# Patient Record
Sex: Female | Born: 1935 | Race: Black or African American | Hispanic: No | Smoking: Never smoker
Health system: Southern US, Community
[De-identification: ages and names within clinical notes are randomized; demographics above are authoritative.]

## PROBLEM LIST (undated history)

## (undated) DIAGNOSIS — I509 Heart failure, unspecified: Secondary | ICD-10-CM

## (undated) DIAGNOSIS — S42409A Unspecified fracture of lower end of unspecified humerus, initial encounter for closed fracture: Secondary | ICD-10-CM

## (undated) DIAGNOSIS — I2510 Atherosclerotic heart disease of native coronary artery without angina pectoris: Secondary | ICD-10-CM

## (undated) DIAGNOSIS — I4891 Unspecified atrial fibrillation: Secondary | ICD-10-CM

## (undated) DIAGNOSIS — D649 Anemia, unspecified: Secondary | ICD-10-CM

## (undated) DIAGNOSIS — S82209A Unspecified fracture of shaft of unspecified tibia, initial encounter for closed fracture: Secondary | ICD-10-CM

## (undated) DIAGNOSIS — I10 Essential (primary) hypertension: Secondary | ICD-10-CM

## (undated) HISTORY — DX: Unspecified fracture of lower end of unspecified humerus, initial encounter for closed fracture: S42.409A

## (undated) HISTORY — DX: Anemia, unspecified: D64.9

## (undated) HISTORY — DX: Unspecified fracture of shaft of unspecified tibia, initial encounter for closed fracture: S82.209A

---

## 1999-02-12 ENCOUNTER — Encounter: Payer: Self-pay | Admitting: Cardiology

## 1999-02-12 ENCOUNTER — Ambulatory Visit (HOSPITAL_COMMUNITY): Admission: RE | Admit: 1999-02-12 | Discharge: 1999-02-12 | Payer: Self-pay | Admitting: Cardiology

## 2000-11-10 ENCOUNTER — Encounter: Payer: Self-pay | Admitting: Cardiology

## 2000-11-10 ENCOUNTER — Ambulatory Visit (HOSPITAL_COMMUNITY): Admission: RE | Admit: 2000-11-10 | Discharge: 2000-11-10 | Payer: Self-pay | Admitting: Cardiology

## 2001-07-06 ENCOUNTER — Emergency Department (HOSPITAL_COMMUNITY): Admission: EM | Admit: 2001-07-06 | Discharge: 2001-07-06 | Payer: Self-pay | Admitting: Unknown Physician Specialty

## 2003-07-23 ENCOUNTER — Encounter: Admission: RE | Admit: 2003-07-23 | Discharge: 2003-07-23 | Payer: Self-pay | Admitting: Cardiology

## 2003-10-24 ENCOUNTER — Ambulatory Visit (HOSPITAL_COMMUNITY): Admission: RE | Admit: 2003-10-24 | Discharge: 2003-10-24 | Payer: Self-pay | Admitting: Cardiology

## 2004-03-07 ENCOUNTER — Ambulatory Visit: Payer: Self-pay | Admitting: Physical Medicine & Rehabilitation

## 2004-03-07 ENCOUNTER — Inpatient Hospital Stay (HOSPITAL_COMMUNITY): Admission: RE | Admit: 2004-03-07 | Discharge: 2004-03-12 | Payer: Self-pay | Admitting: Neurosurgery

## 2004-07-30 ENCOUNTER — Encounter: Admission: RE | Admit: 2004-07-30 | Discharge: 2004-10-28 | Payer: Self-pay | Admitting: Neurosurgery

## 2005-05-20 ENCOUNTER — Encounter: Admission: RE | Admit: 2005-05-20 | Discharge: 2005-05-20 | Payer: Self-pay | Admitting: Cardiology

## 2006-08-07 ENCOUNTER — Ambulatory Visit (HOSPITAL_COMMUNITY): Admission: RE | Admit: 2006-08-07 | Discharge: 2006-08-07 | Payer: Self-pay | Admitting: Ophthalmology

## 2006-09-29 ENCOUNTER — Emergency Department (HOSPITAL_COMMUNITY): Admission: EM | Admit: 2006-09-29 | Discharge: 2006-09-29 | Payer: Self-pay | Admitting: Emergency Medicine

## 2008-10-20 ENCOUNTER — Encounter (INDEPENDENT_AMBULATORY_CARE_PROVIDER_SITE_OTHER): Payer: Self-pay | Admitting: Cardiology

## 2008-10-20 ENCOUNTER — Ambulatory Visit: Payer: Self-pay | Admitting: Vascular Surgery

## 2008-10-20 ENCOUNTER — Ambulatory Visit (HOSPITAL_COMMUNITY): Admission: RE | Admit: 2008-10-20 | Discharge: 2008-10-20 | Payer: Self-pay | Admitting: Cardiology

## 2008-11-09 ENCOUNTER — Encounter (HOSPITAL_COMMUNITY): Admission: RE | Admit: 2008-11-09 | Discharge: 2009-01-10 | Payer: Self-pay | Admitting: Cardiology

## 2008-11-11 ENCOUNTER — Emergency Department (HOSPITAL_COMMUNITY): Admission: EM | Admit: 2008-11-11 | Discharge: 2008-11-11 | Payer: Self-pay | Admitting: Emergency Medicine

## 2008-11-18 ENCOUNTER — Emergency Department (HOSPITAL_COMMUNITY): Admission: EM | Admit: 2008-11-18 | Discharge: 2008-11-18 | Payer: Self-pay | Admitting: Family Medicine

## 2010-01-10 ENCOUNTER — Ambulatory Visit (HOSPITAL_COMMUNITY): Admission: RE | Admit: 2010-01-10 | Discharge: 2010-01-10 | Payer: Self-pay | Admitting: Internal Medicine

## 2010-07-11 ENCOUNTER — Ambulatory Visit
Admission: RE | Admit: 2010-07-11 | Discharge: 2010-07-11 | Disposition: A | Discharge status: Home / Self Care | Payer: Medicare Other | Source: Ambulatory Visit | Attending: Cardiology | Admitting: Cardiology

## 2010-07-11 ENCOUNTER — Other Ambulatory Visit: Payer: Self-pay | Admitting: Cardiology

## 2010-07-11 DIAGNOSIS — R109 Unspecified abdominal pain: Secondary | ICD-10-CM

## 2010-07-14 LAB — BASIC METABOLIC PANEL
CO2: 26 mEq/L (ref 19–32)
Calcium: 9.4 mg/dL (ref 8.4–10.5)
GFR calc non Af Amer: 51 mL/min — ABNORMAL LOW (ref >60)
Glucose, Bld: 90 mg/dL (ref 70–99)
Potassium: 3.8 mEq/L (ref 3.5–5.1)
Sodium: 140 mEq/L (ref 135–145)

## 2010-07-14 LAB — CBC
Platelets: 199 10*3/uL (ref 150–400)
RBC: 3.66 MIL/uL — ABNORMAL LOW (ref 3.87–5.11)
WBC: 5.8 10*3/uL (ref 4.0–10.5)

## 2010-07-14 LAB — POCT CARDIAC MARKERS: Troponin i, poc: <0.05 ng/mL (ref 0.00–0.09)

## 2010-07-14 LAB — DIFFERENTIAL
Basophils Relative: 0 % (ref 0–1)
Lymphs Abs: 2 10*3/uL (ref 0.7–4.0)
Monocytes Absolute: 0.6 10*3/uL (ref 0.1–1.0)
Monocytes Relative: 10 % (ref 3–12)
Neutro Abs: 3 10*3/uL (ref 1.7–7.7)
Neutrophils Relative %: 52 % (ref 43–77)

## 2010-08-21 ENCOUNTER — Ambulatory Visit (HOSPITAL_COMMUNITY)
Admission: RE | Admit: 2010-08-21 | Discharge: 2010-08-21 | Disposition: A | Discharge status: Home / Self Care | Payer: Medicare Other | Source: Ambulatory Visit | Attending: Cardiology | Admitting: Cardiology

## 2010-08-21 DIAGNOSIS — I2510 Atherosclerotic heart disease of native coronary artery without angina pectoris: Secondary | ICD-10-CM | POA: Insufficient documentation

## 2010-08-21 DIAGNOSIS — I509 Heart failure, unspecified: Secondary | ICD-10-CM | POA: Insufficient documentation

## 2010-08-21 DIAGNOSIS — I10 Essential (primary) hypertension: Secondary | ICD-10-CM | POA: Insufficient documentation

## 2010-08-21 DIAGNOSIS — I209 Angina pectoris, unspecified: Secondary | ICD-10-CM | POA: Insufficient documentation

## 2010-08-21 DIAGNOSIS — E039 Hypothyroidism, unspecified: Secondary | ICD-10-CM | POA: Insufficient documentation

## 2010-08-24 NOTE — Discharge Summary (Signed)
NAMEKATEENA, Danielle [name]                ACCOUNT NO.:  [id number]   MEDICAL RECORD NO.:  [id number]          PATIENT TYPE:  INP   LOCATION:  3014                         FACILITY:  [facility]   PHYSICIAN:  Cristi Loron, M.D.DATE OF BIRTH:  [birth year]   DATE OF ADMISSION:  03/07/2004  DATE OF DISCHARGE:  03/12/2004                                 DISCHARGE SUMMARY   BRIEF HISTORY:  Patient is an 77 year old black female who suffers with  symptoms consistent with neurogenic claudication.  She has failed medical  management and was worked up with a lumbar MRI, which demonstrates spinal  stenosis at L2-3, 3-4, 4-5, and 5-1.  I discussed the various treatment  options with the patient, including surgery.  The patient has weighed the  risks, benefits and alternatives of surgery and decided to proceed with a  decompressive laminectomy.   For further details of this admission, please refer to the admission history  and physical.   HOSPITAL COURSE:  I admitted the patient to [facility] on March 07, 2004.  On the day of admission, I performed an L2-S1 laminectomy to  treat her spinal stenosis.  For full details of the operation, please refer  to typed operative note.   The patient's postoperative course was remarkable only as follows:  She was  noted to be anemic with a hemoglobin of 7.6.  She started off with a low  hemoglobin, and she was therefore transfused two units of packed cells.  We  had PT, OT, and rehab team see the patient.  They did not think that she  needed inpatient rehab.  By March 12, 2004, the patient was afebrile.  Vital signs were stable.  She was eating well, ambulating well.  Her wound  was healing well without signs of infection.  She was requesting discharge  to home.  At that time, I should mention her post-transfusion hemoglobin had  increased to 10.1.  Patient was discharged to home on March 12, 2004.  Arrangements were made for home health   care/PT/OT.   DISCHARGE INSTRUCTIONS:  Patient was instructed to resume her outpatient  medical regimen and to follow up with me in four weeks.  She was given  written discharge instructions.   DISCHARGE PRESCRIPTIONS:  1.  Percocet 5 #100 1-2 p.o. q.4h. p.r.n. pain.  2.  Valium 2 mg #50 1 p.o. q.8h. p.r.n. muscle spasms.   FINAL DIAGNOSES:  L2-3, L3-4, L4-5, L5-S1 spinal stenosis, degenerative disk  disease, lumbago, lumbar radiculopathy.   PROCEDURE PERFORMED:  Bilateral L2 laminotomies, complete laminectomy at L3-  5 with a partial S1 laminectomy using microdissection.    JDJ/MEDQ  D:  04/19/2004  T:  04/19/2004  Job:  [id number]

## 2010-08-24 NOTE — Op Note (Signed)
Danielle [name], Danielle [name]                ACCOUNT NO.:  [id number]   MEDICAL RECORD NO.:  [id number]          PATIENT TYPE:  INP   LOCATION:  3014                         FACILITY:  [facility]   PHYSICIAN:  Cristi Loron, M.D.DATE OF BIRTH:  06-Aug-1916   DATE OF PROCEDURE:  03/07/2004  DATE OF DISCHARGE:                                 OPERATIVE REPORT   BRIEF HISTORY:  The patient is an 77 year old black female who suffered from  symptoms consistent with neurogenic claudication.  She failed medical  management and was worked up with a lumbar MRI which demonstrated spinal  stenosis at L2-3, L3-4, L4-5, and L5-S1.  I discussed the various treatment  options with the patient, including surgery.  The patient weighed the risks,  benefits, and alternatives to surgery, so I proceeded with a decompressive  laminectomy.   PREOPERATIVE DIAGNOSIS:  L2-3, L3-4, L4-5, L5-S1 spinal stenosis,  degenerative disc disease, lumbago, lumbar radiculopathy.   POSTOPERATIVE DIAGNOSIS:  L2-3, L3-4, L4-5, L5-S1 spinal stenosis,  degenerative disc disease, lumbago, lumbar radiculopathy.   PROCEDURE:  Bilateral L2 laminotomies; laminectomy L3, L4, and L5, with a  partial S1 laminectomy using microdissection.   SURGEON:  Cristi Loron, M.D.   ASSISTANT:  Hewitt Shorts, M.D.   ANESTHESIA:  General endotracheal.   ESTIMATED BLOOD LOSS:  400 cc.   SPECIMENS:  None.   DRAINS:  None.   COMPLICATIONS:  None.   DESCRIPTION OF PROCEDURE:  The patient was brought to the operating room by  the anesthesia team.  General endotracheal anesthesia was induced.  The  patient was turned to the prone position on the Wilson frame.  Her  lumbosacral region was then prepared with Betadine scrub and Betadine  solution.  Sterile drapes were then applied.  I then injected the _____ with  Marcaine with epinephrine solution and used a scalpel to make a linear  midline incision over the spinous processes from  approximately L2 down to  the upper sacrum.  I used electrocautery to provide a subperiosteal  dissection, exposing the spinous processes and the lamina of L1 down to the  upper sacrum.  I then obtained an intraoperative radiograph.  I inserted a  Hubbard's retractor for exposure.  I began by incising the L2-3, L3-4, L4-5,  and L5-S1 paraspinous ligament with a scalpel, and then used the Leksell  rongeur to remove the L3, L4, and L5 spinous process and the caudal aspect  of the L2 spinous process and the upper aspect of the S1 spinous process.  I  then used the high-speed drill to perform L5, L4, L3, and L2 laminotomies.  I completed the laminotomy at L4, L4, and L3 with the Kerrison punch and  widened the laminotomy at L2 with the Kerrison punch.  I removed the  ligamentum flavum at L2-3, L3-4, L4-5, and L5-S1.  I did create a small  durotomy down in the midline dorsal dura at S1.  I then did more of an S1  laminectomy with the Kerrison punch, exposing more the dura, and then I  closed the durotomy with a running  6-0 Prolene suture.  We got a good  closure, and we had anesthesia Valsalva the patient.  There was no spinal  fluid leakage.  We then completed the decompression.  We performed  foraminotomies about the bilateral L3, L4, L5, and nerve roots.  We then  inspected about L2-L3, L3-4, L4-5, and L5-S1 intervertebral disk.  Note that  there was some bulging but no significant herniations.  At this point, we  were satisfied with our decompression of the thecal and the bilateral L3,  L4, L5, and S1 nerve roots.  We achieved hemostasis using bipolar  electrocautery.  We then laid some Tisseal over the durotomy repair, and  then removed the Hubbard's retractor and reapproximated the patient's  thoracolumbar fascia with interrupted #1 Vicryl suture, the subcutaneous  tissue with interrupted 2-0 Vicryl suture, and the skin with Steri-Strips  and Benzoin.  The wound was then coated with  bacitracin ointment and a  sterile dressing applied.  The drapes were removed, and the patient was  subsequently returned to the supine position, where she was extubated by the  anesthesia team and transported to the postanesthesia care unit in stable  condition.  All sponge, needle, and instrument counts were correct at the  end of this case.   ADDENDUM:  I should mention that we used microdissection to complete the  durotomy repair as well as to complete the foraminotomies about the  bilateral L3,4, 5, and S1 nerve roots.  We did this under magnification and  illumination of the microscope.       JDJ/MEDQ  D:  03/07/2004  T:  03/07/2004  Job:  [id number]

## 2010-08-24 NOTE — Op Note (Signed)
NAMEELENY, Danielle [name]                ACCOUNT NO.:  [id number]   MEDICAL RECORD NO.:  [id number]          PATIENT TYPE:  AMB   LOCATION:  SDS                          FACILITY:  [facility]   PHYSICIAN:  Salley Scarlet., M.D.DATE OF BIRTH:  Dec 23, 1916   DATE OF PROCEDURE:  08/09/2006  DATE OF DISCHARGE:  08/07/2006                               OPERATIVE REPORT   PREOPERATIVE DIAGNOSIS:  Immature cataract, right eye.   POSTOPERATIVE DIAGNOSIS:  Immature cataract, right eye.   OPERATION:  [facility] phacoemulsification cataract, right eye, with  intraocular lens implantation.   ANESTHESIA:  General.   JUSTIFICATION FOR PROCEDURE:  This is an 77 year old lady who presented  with complaints of blurring of vision with difficulty seeing to get  around.  She was evaluated and found to have a visual acuity best  corrected to 20/200 on the right and 20/100 on the left.  There were  bilateral 3+ nuclear sclerotic cataracts slightly worse on the right  than the left.  Cataract extraction with intraocular lens implantation  was recommended.  She is admitted at this time for this purpose.   PROCEDURE:  The the patient was prepped and draped in the usual manner.  During the time that she was being prepped, she developed a severe cramp  in her right thigh and began to thrash about.  She had been extremely  talkative and difficult to control in this preoperative period.  Decision was, therefore, made to intubate her and do her under general  anesthesia.  Therefore, the case of was continued under general  anesthesia.  She was then reprepped and draped.  The lid speculum was  inserted under the upper lower lid of the right eye and a 4-0 silk  traction suture was passed through the belly of the superior rectus  muscle for traction.  A fornix-based conjunctival flap was turned and  hemostasis achieved using cautery.  An incision made in the sclera at  the limbus.  This incision was dissected down to  clear cornea using  crescent blade.  A sideport incision made at the 1:30 o'clock position.  Ocucoat was injected into the eye through the sideport incision.  The  anterior chamber was entered through the corneoscleral tunnel incision  at the 11:30 o'clock position.  The KPE handpiece was passed into the  eye and the nucleus was begun to be emulsified.  During this procedure,  it was noted that the anterior chamber deepened and vitreous presented  itself in the wound.  The nucleus began to drop back into the vitreous  cavity.  The procedure was terminated at this point and the  corneoscleral wound was extended first toward the right and then toward  the left  using corneal scissors, placing an 8-0 Vicryl suture across  each outlet incision made respectively toward the left and toward the  right.  The nucleus was then able to be retrieved and then manually  extracted from the eye.  The two previously placed sutures were closed  and a third one was placed at the 12 o'clock position.  The vitrector  was passed into the eye.  And an  anterior vitrectomy was done.  After  making sure that the wound had been toileted adequately, Ocucoat was  injected into the eye and the pupils constricted using Miochol.  An  anterior chamber lens was seated across the iris into the chamber angle  and rotated in the horizontal position.  Corneoscleral wound were closed  by using a combination of interrupted sutures of 8-0 Vicryl and 10-0  nylon.  After it was ascertained that the wound was airtight and  watertight, the conjunctiva was closed using thermal cautery.  Celestone, 1 mL, 0.5 mL of gentamicin were injected subconjunctivally.  Maxitrol ophthalmic ointment and atropine drops were applied along with  a patch and Fox shield.  The patient tolerated the procedure well and  was discharged to the post anesthesia  recovery room in satisfactory condition.  She is instructed to rest  today, to take Tylenol every  4 hours as needed for pain and to see me in  the office tomorrow for further evaluation   DISCHARGE DIAGNOSES:  Immature cataract, right eye.      Salley Scarlet., M.D.  Electronically Signed     TB/MEDQ  D:  08/09/2006  T:  08/09/2006  Job:  [id number]

## 2010-08-26 NOTE — Cardiovascular Report (Signed)
Danielle [name], Danielle [name]                ACCOUNT NO.:  [id number]  MEDICAL RECORD NO.:  [id number]           PATIENT TYPE:  O  LOCATION:  [facility]                         FACILITY:  [facility]  PHYSICIAN:  Kendrix Orman N. Sharyn Lull, M.D. DATE OF BIRTH:  Mar 04, 1917  DATE OF PROCEDURE:  08/21/2010 DATE OF DISCHARGE:                           CARDIAC CATHETERIZATION   PROCEDURE:  Left cardiac cath with selective left and right coronary angiography, LV graphy via right groin using Judkins technique.  INDICATIONS FOR PROCEDURE:  Danielle [name] is a 77 year old black female with past medical history significant for hypertension, history of congestive heart failure, history of hypothyroidism, degenerative joint disease, history of angina pectoris, complains of retrosternal chest pain, described as tired feeling in the chest, radiating to the neck and arms.  Denies any nausea, vomiting, diaphoresis.  Denies palpitation, lightheadedness, or syncope.  The patient denies PND, orthopnea, or leg swelling.  EKG done in the past showed normal sinus rhythm with left bundle-branch block pattern.  Also, the patient complains of shortness of breath with minimal exertion.  Denies chest pain at present.  Denies any invasive workup in the past.  Denies cough, fever, chills.  Denies relation of chest pain to food, breathing, or movement.  Due to typical anginal chest pain, multiple risk factors discussed with the patient regarding left cath, possible PTCA stenting, its risks and benefits i.e. death, stroke, need for emergency CABG, local vascular complications etc., and consented for PCI.  PROCEDURE:  After obtaining informed consent, the patient was brought to the cath lab and was placed on fluoroscopy table.  The right groin was prepped and draped in usual fashion.  Xylocaine 1% was used for local anesthesia in the right groin.  With the help of thin-wall needle, 5- French arterial sheath was placed.  The sheath was aspirated  and flushed.  Next, 5-French left Judkins catheter was advanced over the wire under fluoroscopic guidance up to the ascending aorta.  Wire was pulled out, the catheter was aspirated and connected to the manifold. Catheter was further advanced and engaged into left coronary ostium. Multiple views of the left system were taken.  Next, the catheter was disengaged and was pulled out over the wire and was replaced with 5- [place] right Judkins catheter, which was advanced over the wire under fluoroscopic guidance up to the ascending aorta.  Wire was pulled out, the catheter was aspirated and connected to the manifold.  Catheter was further advanced and engaged into the right coronary ostium.  Multiple views of the right system were taken.  Next, the catheter was disengaged and was pulled out over the wire and was replaced with 5-French pigtail catheter which was advanced over the wire under fluoroscopic guidance up to the ascending aorta.  Wire was pulled out, the catheter was aspirated and connected to the manifold.  Catheter was further advanced across aortic valve into the LV.  LV pressures were recorded.  Next, LV graphy was done in 30-degree RAO position.  Post angiographic pressures were recorded from LV and then pullback pressures were recorded from the aorta.  There was no gradient across the aortic  valve.  Next, pigtail catheter was pulled out over the wire.  Sheaths were aspirated and flushed.  FINDINGS:  LV showed good LV systolic function.  LVH with EF of 55-60%. Left main has 20-25% ostial stenosis.  LAD has 10-15% proximal and mid stenosis.  Diagonal I and III were very small, which has mild disease. Left circumflex has 20-30% proximal stenosis.  OM1 is large which is patent.  OM-2 is small which is patent.  RCA has 30-40% proximal stenosis.  PDA and PLV branches were patent.  The patient tolerated the procedure well.  There were no complications.  The patient was transferred to  recovery room in stable condition.     Danielle [name]. Sharyn Lull, M.D.     MNH/MEDQ  D:  08/21/2010  T:  08/21/2010  Job:  [id number]  Electronically Signed by Rinaldo Cloud M.D. on 08/26/2010 08:57:00 PM

## 2010-09-24 ENCOUNTER — Institutional Professional Consult (permissible substitution): Payer: Medicare Other | Admitting: Internal Medicine

## 2010-09-25 ENCOUNTER — Encounter: Payer: Self-pay | Admitting: Internal Medicine

## 2011-01-23 LAB — COMPREHENSIVE METABOLIC PANEL
ALT: 10
AST: 21
Albumin: 3.9
Alkaline Phosphatase: 116
Chloride: 101
Potassium: 3.9
Sodium: 138
Total Bilirubin: 0.9

## 2011-01-23 LAB — CBC
HCT: 38.5
Platelets: 263
RDW: 13.5
WBC: 7.3

## 2011-01-23 LAB — URINE MICROSCOPIC-ADD ON

## 2011-01-23 LAB — DIFFERENTIAL
Basophils Relative: 0
Eosinophils Absolute: 0
Monocytes Relative: 9
Neutrophils Relative %: 63

## 2011-01-23 LAB — URINE CULTURE: Colony Count: NO GROWTH

## 2011-01-23 LAB — URINALYSIS, ROUTINE W REFLEX MICROSCOPIC
Glucose, UA: NEGATIVE
Ketones, ur: 15 — AB
Nitrite: NEGATIVE
pH: 5.5

## 2011-05-15 ENCOUNTER — Telehealth: Payer: Self-pay | Admitting: *Deleted

## 2011-05-15 NOTE — Telephone Encounter (Signed)
Spoke with Daughter.... Lisa's, the granddaughter, phone is not working. Scheduled appointment for tomorrow per Dr Johney Frame. Told her if she couldn't come please call us ASAP so we can reschedule her, but she does need to be seen ASAP. We rcvd call from Dr Shana Chute, he wanted pt seen.

## 2011-05-16 ENCOUNTER — Ambulatory Visit: Payer: Medicare Other | Admitting: Internal Medicine

## 2011-09-17 ENCOUNTER — Encounter (HOSPITAL_COMMUNITY): Payer: Self-pay | Admitting: *Deleted

## 2011-09-17 ENCOUNTER — Emergency Department (HOSPITAL_COMMUNITY)
Admission: EM | Admit: 2011-09-17 | Discharge: 2011-09-18 | Disposition: A | Discharge status: Home / Self Care | Payer: Medicare Other | Attending: Emergency Medicine | Admitting: Emergency Medicine

## 2011-09-17 DIAGNOSIS — R609 Edema, unspecified: Secondary | ICD-10-CM | POA: Insufficient documentation

## 2011-09-17 DIAGNOSIS — Z79899 Other long term (current) drug therapy: Secondary | ICD-10-CM | POA: Insufficient documentation

## 2011-09-17 DIAGNOSIS — R600 Localized edema: Secondary | ICD-10-CM

## 2011-09-17 DIAGNOSIS — I2510 Atherosclerotic heart disease of native coronary artery without angina pectoris: Secondary | ICD-10-CM | POA: Insufficient documentation

## 2011-09-17 DIAGNOSIS — R0602 Shortness of breath: Secondary | ICD-10-CM | POA: Insufficient documentation

## 2011-09-17 DIAGNOSIS — I509 Heart failure, unspecified: Secondary | ICD-10-CM | POA: Insufficient documentation

## 2011-09-17 DIAGNOSIS — I10 Essential (primary) hypertension: Secondary | ICD-10-CM | POA: Insufficient documentation

## 2011-09-17 DIAGNOSIS — I499 Cardiac arrhythmia, unspecified: Secondary | ICD-10-CM | POA: Insufficient documentation

## 2011-09-17 HISTORY — DX: Atherosclerotic heart disease of native coronary artery without angina pectoris: I25.10

## 2011-09-17 HISTORY — DX: Essential (primary) hypertension: I10

## 2011-09-17 HISTORY — DX: Heart failure, unspecified: I50.9

## 2011-09-17 NOTE — ED Notes (Signed)
Both feet ankles and lower legs swollen for 3-4 days.  Also having difficulty urinating

## 2011-09-18 ENCOUNTER — Emergency Department (HOSPITAL_COMMUNITY): Payer: Medicare Other

## 2011-09-18 LAB — COMPREHENSIVE METABOLIC PANEL
ALT: 6 U/L (ref 0–35)
Alkaline Phosphatase: 65 U/L (ref 39–117)
BUN: 15 mg/dL (ref 6–23)
CO2: 28 mEq/L (ref 19–32)
GFR calc Af Amer: 55 mL/min — ABNORMAL LOW (ref >90)
GFR calc non Af Amer: 47 mL/min — ABNORMAL LOW (ref >90)
Glucose, Bld: 90 mg/dL (ref 70–99)
Potassium: 3.6 mEq/L (ref 3.5–5.1)
Sodium: 141 mEq/L (ref 135–145)
Total Protein: 7 g/dL (ref 6.0–8.3)

## 2011-09-18 LAB — URINALYSIS, ROUTINE W REFLEX MICROSCOPIC
Bilirubin Urine: NEGATIVE
Glucose, UA: NEGATIVE mg/dL
Hgb urine dipstick: NEGATIVE
Ketones, ur: NEGATIVE mg/dL
Protein, ur: NEGATIVE mg/dL
Urobilinogen, UA: 0.2 mg/dL (ref 0.0–1.0)

## 2011-09-18 LAB — TROPONIN I: Troponin I: <0.3 ng/mL (ref <0.30)

## 2011-09-18 LAB — CBC
Hemoglobin: 10.3 g/dL — ABNORMAL LOW (ref 12.0–15.0)
MCH: 30.9 pg (ref 26.0–34.0)
MCV: 91.9 fL (ref 78.0–100.0)
Platelets: 195 10*3/uL (ref 150–400)
RBC: 3.33 MIL/uL — ABNORMAL LOW (ref 3.87–5.11)
WBC: 3.9 10*3/uL — ABNORMAL LOW (ref 4.0–10.5)

## 2011-09-18 LAB — DIFFERENTIAL
Eosinophils Absolute: 0.2 10*3/uL (ref 0.0–0.7)
Eosinophils Relative: 4 % (ref 0–5)
Lymphocytes Relative: 31 % (ref 12–46)
Lymphs Abs: 1.2 10*3/uL (ref 0.7–4.0)
Monocytes Relative: 10 % (ref 3–12)
Neutrophils Relative %: 55 % (ref 43–77)

## 2011-09-18 MED ORDER — FUROSEMIDE 20 MG PO TABS: 20.0000 mg | ORAL_TABLET | Freq: Every day | ORAL | Status: DC

## 2011-09-18 MED ORDER — FUROSEMIDE 20 MG PO TABS
20.0000 mg | ORAL_TABLET | Freq: Once | ORAL | Status: AC
  Administered 2011-09-18: 20 mg via ORAL
  Filled 2011-09-18: qty 1

## 2011-09-18 NOTE — ED Notes (Signed)
ASSISTED TO RESTROOM USING STEADY TO TRANSPORT WITH DAUGHTER.

## 2011-09-18 NOTE — Discharge Instructions (Signed)
You were seen and evaluated for your swelling of your feet. At this time your blood tests, EKG of your heart and chest x-ray did not show any signs for concerning cause of your symptoms. You have been given a prescription for Lasix to help reduced of fluid in your legs. Please take this once a day and followup with your primary care provider, Dr. Shana Chute this week. If you develop any worsening symptoms, shortness of breath or difficulty breathing, chest pain or heart palpitations return to the emergency room.   Peripheral Edema You have swelling in your legs (peripheral edema). This swelling is due to excess accumulation of salt and water in your body. Edema may be a sign of heart, kidney or liver disease, or a side effect of a medication. It may also be due to problems in the leg veins. Elevating your legs and using special support stockings may be very helpful, if the cause of the swelling is due to poor venous circulation. Avoid long periods of standing, whatever the cause. Treatment of edema depends on identifying the cause. Chips, pretzels, pickles and other salty foods should be avoided. Restricting salt in your diet is almost always needed. Water pills (diuretics) are often used to remove the excess salt and water from your body via urine. These medicines prevent the kidney from reabsorbing sodium. This increases urine flow. Diuretic treatment may also result in lowering of potassium levels in your body. Potassium supplements may be needed if you have to use diuretics daily. Daily weights can help you keep track of your progress in clearing your edema. You should call your caregiver for follow up care as recommended. SEEK IMMEDIATE MEDICAL CARE IF:   You have increased swelling, pain, redness, or heat in your legs.   You develop shortness of breath, especially when lying down.   You develop chest or abdominal pain, weakness, or fainting.   You have a fever.  Document Released: 05/02/2004  Document Revised: 04-10-2010 Document Reviewed: 04/12/2009 [facility] Patient Information 2012 [place], [place].    RESOURCE GUIDE  Chronic Pain Problems: Contact [facility]  [phone] Patients need to be referred by their primary care doctor.  Insufficient Money for Medicine: Contact United Way:  call "211" or [facility] ([phone].  No Primary Care Doctor: - Call Health [facility]  [phone] - can help you locate a primary care doctor that  accepts your insurance, provides certain services, etc. - Physician Referral [phone]  Agencies that provide inexpensive medical care: - [facility]  [phone] - [facility]  [phone] - [facility]  [phone] [facility]  [phone] - [facility]  [phone] [facility]  [phone]  Medicaid-accepting [facility] Providers: - [facility]- [address]  [phone], Mon-Fri 9am-7pm, Sat 9am-1pm - [facility]- [address] [place], [address] [place]  [phone] - [facility]- [address] [place]  [phone] [facility]- [address]  [phone] - Renaye Rakers- [address] [place], [address], [phone]  Only accepts [place] patients after they have their name  applied to their card  Self Pay (no insurance) in [place]: - Sickle Cell Patients: Dr Willey Blade, [facility]  [address] [place], [phone] - [facility]- [address] [place]  [phone]       -  [facility]- [address]       -     [facility]- see information above (Speak to [employer] if you do not have insurance)       -  [facility]- [address] [place], [phone]       -  [facility]- [address],  [phone]       -  [facility]- [address]  [phone]       -  Dr Julio Sicks-  [address], [place], [phone]       -  [facility]- [address] [phone]       -  [facility]- [address], [phone], also [address] [phone]       -    [facility]- [address] [place], [phone], 1st & 3rd Saturday   every month, 10am-1pm  1) Find a Doctor and Pay Out of Pocket Although you won't have to find out who is covered by your insurance plan, it is a good idea to ask around and get recommendations. You will then need to call the office and see if the doctor you have chosen will accept you as a new patient and what types of options they offer for patients who are self-pay. Some doctors offer discounts or will set up payment plans for their patients who do not have insurance, but you will need to ask so you aren't surprised when you get to your appointment.  2) Contact Your Local Health Department Not all health departments have doctors that can see patients for sick visits, but many do, so it is worth a call to see if yours does. If you don't know where your local health department is, you can check in your phone book. The CDC also has a tool to help you locate your state's health department, and many state websites also have listings of all of their local health departments.  3) Find a Walk-in Clinic If your illness is not likely to be very severe or complicated, you may want to try a walk in clinic. These are popping up all over the country in pharmacies, drugstores, and shopping centers. They're usually staffed by nurse practitioners or physician assistants that have been trained to treat common illnesses and complaints. They're usually fairly quick and inexpensive. However, if you have serious medical issues or chronic medical problems, these are probably not your best option  STD Testing - [facility] Department of [facility], STD Clinic, [address] [place], phone [phone] or [phone].  Monday - Friday, call for an appointment. [facility] Department of [employer], STD Clinic, [place] [address], [place], phone [phone] or [phone].  Monday - Friday, call for an appointment.  Abuse/Neglect: [facility] Child Abuse Hotline 458-763-7651 [facility] Child Abuse Hotline 8053916077 (After Hours)  Emergency Shelter:  [facility] [phone]  Maternity Homes: - Room at the [facility] 2054249636 - [facility] [phone]  MRSA Hotline #:   407-722-8221  [facility] Resources  [facility] [place]  [employer] [facility] Dept. [address]                 [address]  [address]  [place]                                               Cristobal Goldmann Phone:  [phone]                                  Phone:  [phone]                   Phone:  [phone]  [facility], [phone] - [phone]       -     [facility] in [place], [address],                                  639-493-2025, [facility] Child Abuse Hotline 8574935280 or 424-269-2199 (After Hours)   Behavioral Health Services  Substance Abuse Resources: - [facility]  610-641-2059 - [facility] [phone] - [facility] [phone] Floydene Flock ([phone] - Residential & Outpatient Substance Abuse Program  ([phone]  Psychological Services: [facility]  [phone] [facility]  [phone] - [facility], [phone] [place]. [address], [place], ACCESS LINE: (971)659-7610 or 514-614-6749, [employer]  Dental Assistance  If unable to pay or  uninsured, contact:  [facility] or [facility] to become qualified for the adult dental clinic.  Patients with Medicaid: [facility] [phone] [address], [phone] [address] [phone]  If unable to pay, or uninsured, contact [facility] [phone]) or [facility] Department [phone] in [place], [phone] in [facility]) to become qualified for the adult dental clinic  Other Low-Cost Community Dental Services: - [facility]- [address] [place], [place], [place], [address], [phone], [address]. [phone], 2nd and 4th Thursday of the month at 6:30am.  10 clients each day by appointment, can sometimes see walk-in patients if someone does not show for an appointment. [facility]- [address] Ether Griffins [place], [place], [address], [phone] - [facility]- [address], [place], [place], [address], [phone] - [place]- [phone] [facility]- [phone] [facility] Department- [phone]

## 2011-09-18 NOTE — ED Provider Notes (Signed)
History     CSN: [id number]  Arrival date & time 09/17/11  2309   First MD Initiated Contact with Patient 09/18/11 0217      Chief Complaint  Patient presents with  . feet and legs swollen    HPI  History provided by the patient and family. Patient is a 78 year old female with history of hypertension, coronary artery disease and CHF who presents with complaints of increasing swelling to bilateral feet for the past one week. Symptoms have been gradually increasing. Patient's granddaughter states that patient may have missed some doses of her normal medications. Symptoms are described as mild. Swelling improved slightly with elevation. She denies any other aggravating or alleviating factors. Patient does report some associated shortness of breath at times and heaviness sensation of breathing. Patient denies any chest pain or heart palpitations. Patient denies any cough, fever, chills, nausea or diaphoresis.    Past Medical History  Diagnosis Date  . Coronary artery disease   . CHF (congestive heart failure)   . Hypertension     History reviewed. No pertinent past surgical history.  No family history on file.  History  Substance Use Topics  . Smoking status: Never Smoker   . Smokeless tobacco: Not on file  . Alcohol Use: No    OB History    Grav Para Term Preterm Abortions TAB SAB Ect Mult Living                  Review of Systems  Constitutional: Negative for fever and chills.  Respiratory: Positive for shortness of breath. Negative for cough.   Cardiovascular: Positive for leg swelling. Negative for chest pain.  Gastrointestinal: Negative for nausea and vomiting.    Allergies  Keflex and Penicillins  Home Medications   Current Outpatient Rx  Name Route Sig Dispense Refill  . AMLODIPINE BESY-BENAZEPRIL HCL 5-20 MG PO CAPS Oral Take 1 capsule by mouth daily.    Marland Kitchen CLONIDINE HCL 0.2 MG PO TABS Oral Take 0.4 mg by mouth at bedtime.    Marland Kitchen DIGOXIN 0.125 MG PO TABS  Oral Take 125 mcg by mouth daily.    Marland Kitchen METOPROLOL TARTRATE 25 MG PO TABS Oral Take 25 mg by mouth 2 (two) times daily.      BP 146/77  Pulse 73  Temp 98.5 F (36.9 C) (Oral)  Resp 18  SpO2 96%  Physical Exam  Nursing note and vitals reviewed. Constitutional: She is oriented to person, place, and time. She appears well-developed and well-nourished. No distress.  HENT:  Head: Normocephalic and atraumatic.  Cardiovascular: Normal rate.  An irregular rhythm present.  Pulmonary/Chest: Effort normal and breath sounds normal. No respiratory distress. She has no wheezes. She has no rales.  Abdominal: Soft. There is no tenderness.  Musculoskeletal: She exhibits edema. She exhibits no tenderness.       2+ pitting edema of feet the low goals. Normal pulses and sensation in feet.  Neurological: She is alert and oriented to person, place, and time.  Skin: Skin is warm and dry.  Psychiatric: She has a normal mood and affect. Her behavior is normal.    ED Course  Procedures  Results for orders placed during the hospital encounter of 09/17/11  CBC      Component Value Range   WBC 3.9 (*) 4.0 - 10.5 K/uL   RBC 3.33 (*) 3.87 - 5.11 MIL/uL   Hemoglobin 10.3 (*) 12.0 - 15.0 g/dL   HCT 78.2 (*) 95.6 - 21.3 %  MCV 91.9  78.0 - 100.0 fL   MCH 30.9  26.0 - 34.0 pg   MCHC 33.7  30.0 - 36.0 g/dL   RDW 40.9  81.1 - 91.4 %   Platelets 195  150 - 400 K/uL  DIFFERENTIAL      Component Value Range   Neutrophils Relative 55  43 - 77 %   Neutro Abs 2.1  1.7 - 7.7 K/uL   Lymphocytes Relative 31  12 - 46 %   Lymphs Abs 1.2  0.7 - 4.0 K/uL   Monocytes Relative 10  3 - 12 %   Monocytes Absolute 0.4  0.1 - 1.0 K/uL   Eosinophils Relative 4  0 - 5 %   Eosinophils Absolute 0.2  0.0 - 0.7 K/uL   Basophils Relative 1  0 - 1 %   Basophils Absolute 0.0  0.0 - 0.1 K/uL  COMPREHENSIVE METABOLIC PANEL      Component Value Range   Sodium 141  135 - 145 mEq/L   Potassium 3.6  3.5 - 5.1 mEq/L   Chloride 103   96 - 112 mEq/L   CO2 28  19 - 32 mEq/L   Glucose, Bld 90  70 - 99 mg/dL   BUN 15  6 - 23 mg/dL   Creatinine, Ser 7.82  0.50 - 1.10 mg/dL   Calcium 9.5  8.4 - 95.6 mg/dL   Total Protein 7.0  6.0 - 8.3 g/dL   Albumin 3.6  3.5 - 5.2 g/dL   AST 18  0 - 37 U/L   ALT 6  0 - 35 U/L   Alkaline Phosphatase 65  39 - 117 U/L   Total Bilirubin 0.5  0.3 - 1.2 mg/dL   GFR calc non Af Amer 47 (*) >90 mL/min   GFR calc Af Amer 55 (*) >90 mL/min  TROPONIN I      Component Value Range   Troponin I <0.30  <0.30 ng/mL  PRO B NATRIURETIC PEPTIDE      Component Value Range   Pro B Natriuretic peptide (BNP) 1935.0 (*) 0 - 450 pg/mL  URINALYSIS, ROUTINE W REFLEX MICROSCOPIC      Component Value Range   Color, Urine YELLOW  YELLOW   APPearance CLEAR  CLEAR   Specific Gravity, Urine 1.009  1.005 - 1.030   pH 6.5  5.0 - 8.0   Glucose, UA NEGATIVE  NEGATIVE mg/dL   Hgb urine dipstick NEGATIVE  NEGATIVE   Bilirubin Urine NEGATIVE  NEGATIVE   Ketones, ur NEGATIVE  NEGATIVE mg/dL   Protein, ur NEGATIVE  NEGATIVE mg/dL   Urobilinogen, UA 0.2  0.0 - 1.0 mg/dL   Nitrite NEGATIVE  NEGATIVE   Leukocytes, UA NEGATIVE  NEGATIVE      Dg Chest 2 View  09/18/2011  *RADIOLOGY REPORT*  Clinical Data: Shortness of breath  CHEST - 2 VIEW  Comparison: 08/04/2006  Findings: Cardiomegaly.  Central vascular congestion. Hyperinflation.  No overt edema or focal consolidation. Chronic left hemidiaphragm elevation.  Aortic arch atherosclerosis. Osteopenia and multilevel degenerative changes.  The spine is poorly visualized/nondiagnostic for acute injury.  IMPRESSION: Cardiomegaly with central vascular congestion.  No overt edema or focal consolidation.  Original Report Authenticated By: Waneta Martins, M.D.     1. Lower extremity edema   2. CHF (congestive heart failure)       MDM  Patient seen and evaluated. Patient no acute distress.  Pt seen and evaluated by Attending Physician.  At This  time will give low  dose lasix for swelling and have pt follow up with cardiologist.  Patient and family agree with this plan.    Date: 09/18/2011  Rate: 88  Rhythm: sinus arrhythmia  QRS Axis: left  Intervals: normal  ST/T Wave abnormalities: nonspecific ST/T changes  Conduction Disutrbances:left bundle branch block  Narrative Interpretation:   Old EKG Reviewed: unchanged from 08/21/2010   Angus Seller, PA 09/19/11 [phone]

## 2011-09-18 NOTE — ED Notes (Signed)
Pt states she has noticed "a little difference in my breathing" since she notice the swelling in her lower extremities

## 2011-09-20 NOTE — ED Provider Notes (Signed)
Medical screening examination/treatment/procedure(s) were conducted as a shared visit with non-physician practitioner(s) and myself.  I personally evaluated the patient during the encounter.  (78 yo female with lower leg swelling, also with dysuria.  No signs of pulmonary edema, pitting edema noted to legs, ua negative.  To be d/c with lasix and f/u with pcm.  Olivia Mackie, MD 09/20/11 1520

## 2012-11-08 ENCOUNTER — Encounter (HOSPITAL_COMMUNITY): Payer: Self-pay

## 2012-11-08 ENCOUNTER — Emergency Department (HOSPITAL_COMMUNITY): Payer: Medicare Other

## 2012-11-08 ENCOUNTER — Inpatient Hospital Stay (HOSPITAL_COMMUNITY)
Admission: EM | Admit: 2012-11-08 | Discharge: 2012-11-16 | DRG: 481 | Disposition: A | Discharge status: Skilled Nursing Facility | Payer: Medicare Other | Attending: Cardiovascular Disease | Admitting: Cardiovascular Disease

## 2012-11-08 DIAGNOSIS — I2510 Atherosclerotic heart disease of native coronary artery without angina pectoris: Secondary | ICD-10-CM | POA: Diagnosis present

## 2012-11-08 DIAGNOSIS — Z7982 Long term (current) use of aspirin: Secondary | ICD-10-CM

## 2012-11-08 DIAGNOSIS — D62 Acute posthemorrhagic anemia: Secondary | ICD-10-CM | POA: Diagnosis not present

## 2012-11-08 DIAGNOSIS — Z881 Allergy status to other antibiotic agents status: Secondary | ICD-10-CM

## 2012-11-08 DIAGNOSIS — Z79899 Other long term (current) drug therapy: Secondary | ICD-10-CM

## 2012-11-08 DIAGNOSIS — I4891 Unspecified atrial fibrillation: Secondary | ICD-10-CM | POA: Diagnosis not present

## 2012-11-08 DIAGNOSIS — I509 Heart failure, unspecified: Secondary | ICD-10-CM | POA: Diagnosis present

## 2012-11-08 DIAGNOSIS — Z602 Problems related to living alone: Secondary | ICD-10-CM

## 2012-11-08 DIAGNOSIS — Y92009 Unspecified place in unspecified non-institutional (private) residence as the place of occurrence of the external cause: Secondary | ICD-10-CM

## 2012-11-08 DIAGNOSIS — Z880 Allergy status to penicillin: Secondary | ICD-10-CM

## 2012-11-08 DIAGNOSIS — Z7901 Long term (current) use of anticoagulants: Secondary | ICD-10-CM

## 2012-11-08 DIAGNOSIS — I447 Left bundle-branch block, unspecified: Secondary | ICD-10-CM | POA: Diagnosis present

## 2012-11-08 DIAGNOSIS — S72143A Displaced intertrochanteric fracture of unspecified femur, initial encounter for closed fracture: Principal | ICD-10-CM | POA: Diagnosis present

## 2012-11-08 DIAGNOSIS — W010XXA Fall on same level from slipping, tripping and stumbling without subsequent striking against object, initial encounter: Secondary | ICD-10-CM | POA: Diagnosis present

## 2012-11-08 DIAGNOSIS — S72142A Displaced intertrochanteric fracture of left femur, initial encounter for closed fracture: Secondary | ICD-10-CM

## 2012-11-08 DIAGNOSIS — I10 Essential (primary) hypertension: Secondary | ICD-10-CM | POA: Diagnosis present

## 2012-11-08 DIAGNOSIS — W19XXXA Unspecified fall, initial encounter: Secondary | ICD-10-CM

## 2012-11-08 DIAGNOSIS — E860 Dehydration: Secondary | ICD-10-CM | POA: Diagnosis present

## 2012-11-08 DIAGNOSIS — E876 Hypokalemia: Secondary | ICD-10-CM | POA: Diagnosis present

## 2012-11-08 LAB — COMPREHENSIVE METABOLIC PANEL
Albumin: 3.5 g/dL (ref 3.5–5.2)
BUN: 19 mg/dL (ref 6–23)
Calcium: 9.6 mg/dL (ref 8.4–10.5)
Chloride: 99 mEq/L (ref 96–112)
Creatinine, Ser: 0.74 mg/dL (ref 0.50–1.10)
GFR calc non Af Amer: 71 mL/min — ABNORMAL LOW (ref >90)
Total Bilirubin: 0.7 mg/dL (ref 0.3–1.2)

## 2012-11-08 LAB — CBC WITH DIFFERENTIAL/PLATELET
Basophils Absolute: 0 10*3/uL (ref 0.0–0.1)
Eosinophils Absolute: 0 10*3/uL (ref 0.0–0.7)
Eosinophils Relative: 0 % (ref 0–5)
HCT: 31 % — ABNORMAL LOW (ref 36.0–46.0)
Lymphocytes Relative: 11 % — ABNORMAL LOW (ref 12–46)
MCH: 31.4 pg (ref 26.0–34.0)
MCHC: 35.5 g/dL (ref 30.0–36.0)
MCV: 88.6 fL (ref 78.0–100.0)
Monocytes Absolute: 1.2 10*3/uL — ABNORMAL HIGH (ref 0.1–1.0)
Platelets: 195 10*3/uL (ref 150–400)
RDW: 13.6 % (ref 11.5–15.5)
WBC: 12.4 10*3/uL — ABNORMAL HIGH (ref 4.0–10.5)

## 2012-11-08 LAB — URINALYSIS, ROUTINE W REFLEX MICROSCOPIC
Protein, ur: 100 mg/dL — AB
Urobilinogen, UA: 1 mg/dL (ref 0.0–1.0)

## 2012-11-08 LAB — POCT I-STAT TROPONIN I

## 2012-11-08 LAB — URINE MICROSCOPIC-ADD ON

## 2012-11-08 MED ORDER — AMLODIPINE-OLMESARTAN 5-20 MG PO TABS: 1.0000 | ORAL_TABLET | Freq: Every day | ORAL | Status: DC

## 2012-11-08 MED ORDER — CLONIDINE HCL 0.2 MG PO TABS
0.2000 mg | ORAL_TABLET | Freq: Every day | ORAL | Status: DC
  Administered 2012-11-09 – 2012-11-15 (×8): 0.2 mg via ORAL
  Filled 2012-11-08 (×13): qty 1

## 2012-11-08 MED ORDER — SODIUM CHLORIDE 0.9 % IV BOLUS (SEPSIS)
500.0000 mL | Freq: Once | INTRAVENOUS | Status: AC
  Administered 2012-11-08: 500 mL via INTRAVENOUS

## 2012-11-08 MED ORDER — METOPROLOL TARTRATE 25 MG PO TABS
25.0000 mg | ORAL_TABLET | Freq: Two times a day (BID) | ORAL | Status: DC
  Administered 2012-11-09 – 2012-11-16 (×16): 25 mg via ORAL
  Filled 2012-11-08 (×21): qty 1

## 2012-11-08 MED ORDER — VITAMIN C 500 MG PO TABS
500.0000 mg | ORAL_TABLET | Freq: Every day | ORAL | Status: DC
  Administered 2012-11-09 – 2012-11-16 (×8): 500 mg via ORAL
  Filled 2012-11-08 (×10): qty 1

## 2012-11-08 MED ORDER — IRBESARTAN 150 MG PO TABS
150.0000 mg | ORAL_TABLET | Freq: Every day | ORAL | Status: DC
  Administered 2012-11-09 – 2012-11-16 (×8): 150 mg via ORAL
  Filled 2012-11-08 (×9): qty 1

## 2012-11-08 MED ORDER — ADULT MULTIVITAMIN W/MINERALS CH
1.0000 | ORAL_TABLET | Freq: Every day | ORAL | Status: DC
  Administered 2012-11-10 – 2012-11-16 (×7): 1 via ORAL
  Filled 2012-11-08 (×10): qty 1

## 2012-11-08 MED ORDER — FENTANYL CITRATE 0.05 MG/ML IJ SOLN
25.0000 ug | Freq: Once | INTRAMUSCULAR | Status: AC
  Administered 2012-11-08: 25 ug via INTRAVENOUS
  Filled 2012-11-08: qty 2

## 2012-11-08 MED ORDER — SODIUM CHLORIDE 0.9 % IV SOLN
Freq: Once | INTRAVENOUS | Status: AC
  Administered 2012-11-08: 21:00:00 via INTRAVENOUS

## 2012-11-08 MED ORDER — AMLODIPINE BESYLATE 5 MG PO TABS
5.0000 mg | ORAL_TABLET | Freq: Every day | ORAL | Status: DC
  Administered 2012-11-09 – 2012-11-16 (×8): 5 mg via ORAL
  Filled 2012-11-08 (×10): qty 1

## 2012-11-08 NOTE — H&P (Signed)
Danielle [name] is an 79 y.o. female.   Chief Complaint: Fall and hip pain HPI: 79 years old who lives alone was found on floor in kitchen.She was preparing coffee, lost balance and fell. No LOC. Has pain in both hips and right knee. No chest pain. Patient is not a surgical candidate per daughter and primary care.    Past Medical History  Diagnosis Date  . Coronary artery disease   . CHF (congestive heart failure)   . Hypertension       No past surgical history on file.  No family history on file. Social History:  reports that she has never smoked. She does not have any smokeless tobacco history on file. She reports that she does not drink alcohol. Her drug history is not on file.  Allergies:  Allergies  Allergen Reactions  . Keflex (Cephalexin) Itching  . Penicillins Itching     (Not in a hospital admission)  Results for orders placed during the hospital encounter of 11/08/12 (from the past 48 hour(s))  CBC WITH DIFFERENTIAL     Status: Abnormal   Collection Time    11/08/12  6:51 PM      Result Value Range   WBC 12.4 (*) 4.0 - 10.5 K/uL   RBC 3.50 (*) 3.87 - 5.11 MIL/uL   Hemoglobin 11.0 (*) 12.0 - 15.0 g/dL   HCT 40.9 (*) 81.1 - 91.4 %   MCV 88.6  78.0 - 100.0 fL   MCH 31.4  26.0 - 34.0 pg   MCHC 35.5  30.0 - 36.0 g/dL   RDW 78.2  95.6 - 21.3 %   Platelets 195  150 - 400 K/uL   Neutrophils Relative % 80 (*) 43 - 77 %   Neutro Abs 9.9 (*) 1.7 - 7.7 K/uL   Lymphocytes Relative 11 (*) 12 - 46 %   Lymphs Abs 1.3  0.7 - 4.0 K/uL   Monocytes Relative 10  3 - 12 %   Monocytes Absolute 1.2 (*) 0.1 - 1.0 K/uL   Eosinophils Relative 0  0 - 5 %   Eosinophils Absolute 0.0  0.0 - 0.7 K/uL   Basophils Relative 0  0 - 1 %   Basophils Absolute 0.0  0.0 - 0.1 K/uL  CK     Status: Abnormal   Collection Time    11/08/12  6:51 PM      Result Value Range   Total CK 1920 (*) 7 - 177 U/L  COMPREHENSIVE METABOLIC PANEL     Status: Abnormal   Collection Time    11/08/12  6:51 PM       Result Value Range   Sodium 139  135 - 145 mEq/L   Potassium 3.7  3.5 - 5.1 mEq/L   Chloride 99  96 - 112 mEq/L   CO2 28  19 - 32 mEq/L   Glucose, Bld 120 (*) 70 - 99 mg/dL   BUN 19  6 - 23 mg/dL   Creatinine, Ser 0.86  0.50 - 1.10 mg/dL   Calcium 9.6  8.4 - 57.8 mg/dL   Total Protein 7.7  6.0 - 8.3 g/dL   Albumin 3.5  3.5 - 5.2 g/dL   AST 58 (*) 0 - 37 U/L   ALT 16  0 - 35 U/L   Alkaline Phosphatase 85  39 - 117 U/L   Total Bilirubin 0.7  0.3 - 1.2 mg/dL   GFR calc non Af Amer 71 (*) >90 mL/min  GFR calc Af Amer 82 (*) >90 mL/min   Comment:            The eGFR has been calculated     using the CKD EPI equation.     This calculation has not been     validated in all clinical     situations.     eGFR's persistently     <90 mL/min signify     possible Chronic Kidney Disease.  URINALYSIS, ROUTINE W REFLEX MICROSCOPIC     Status: Abnormal   Collection Time    11/08/12  8:41 PM      Result Value Range   Color, Urine AMBER (*) YELLOW   Comment: BIOCHEMICALS MAY BE AFFECTED BY COLOR   APPearance CLOUDY (*) CLEAR   Specific Gravity, Urine 1.019  1.005 - 1.030   pH 5.5  5.0 - 8.0   Glucose, UA NEGATIVE  NEGATIVE mg/dL   Hgb urine dipstick LARGE (*) NEGATIVE   Bilirubin Urine SMALL (*) NEGATIVE   Ketones, ur 15 (*) NEGATIVE mg/dL   Protein, ur 119 (*) NEGATIVE mg/dL   Urobilinogen, UA 1.0  0.0 - 1.0 mg/dL   Nitrite NEGATIVE  NEGATIVE   Leukocytes, UA NEGATIVE  NEGATIVE  URINE MICROSCOPIC-ADD ON     Status: None   Collection Time    11/08/12  8:41 PM      Result Value Range   Squamous Epithelial / LPF RARE  RARE   WBC, UA 0-2  <3 WBC/hpf   RBC / HPF 7-10  <3 RBC/hpf   Bacteria, UA RARE  RARE   Urine-Other MUCOUS PRESENT     Dg Chest 1 View  11/08/2012   *RADIOLOGY REPORT*  Clinical Data: Fall, left hip/right shoulder pain  CHEST - 1 VIEW  Comparison: 09/18/2011.  Findings: Lungs are clear. No pleural effusion or pneumothorax.  Cardiomegaly.  Thoracic dextroscoliosis.   IMPRESSION: No evidence of acute cardiopulmonary disease.   Original Report Authenticated By: Charline Bills, M.D.   Dg Shoulder Right  11/08/2012   *RADIOLOGY REPORT*  Clinical Data: Right shoulder pain.  RIGHT SHOULDER - 2+ VIEW  Comparison: None  Findings: The joint spaces are maintained.  No acute fracture.  The right lung is clear.  IMPRESSION: No fracture or dislocation.   Original Report Authenticated By: Rudie Meyer, M.D.   Dg Hip Complete Left  11/08/2012   *RADIOLOGY REPORT*  Clinical Data: Larey Seat.  Left hip pain.  LEFT HIP - COMPLETE 2+ VIEW  Comparison: None  Findings: There is a displaced intertrochanteric fracture of the left hip.  The pubic symphysis and SI joints are intact.  No definite pelvic fractures. Calcified uterine fibroids are noted.  IMPRESSION: Displaced intertrochanteric fracture.   Original Report Authenticated By: Rudie Meyer, M.D.   Ct Head Wo Contrast  11/08/2012   *RADIOLOGY REPORT*  Clinical Data:  Recent traumatic injury with pain  CT HEAD WITHOUT CONTRAST CT MAXILLOFACIAL WITHOUT CONTRAST CT CERVICAL SPINE WITHOUT CONTRAST  Technique:  Multidetector CT imaging of the head, cervical spine, and maxillofacial structures were performed using the standard protocol without intravenous contrast. Multiplanar CT image reconstructions of the cervical spine and maxillofacial structures were also generated.  Comparison:  11/11/2008  CT HEAD  Findings: The bony calvarium is intact.  Atrophic changes and chronic white matter ischemic change is seen and stable from prior exam. The findings to suggest acute hemorrhage, acute infarction or space-occupying mass lesion are noted.  IMPRESSION: Chronic changes without acute abnormality.  CT  MAXILLOFACIAL  Findings:  The bony structures are within normal limits.  No acute fracture is seen.  The surrounding soft tissues demonstrate the thyroid gland to the prominent no other soft tissue abnormality is seen.  No lymphadenopathy is noted.   IMPRESSION: No acute bony abnormality noted.  Findings suggestive of a thyroid goiter.  CT CERVICAL SPINE  Findings:   Seven cervical segments are well visualized.  Vertebral body height is well-maintained.  Multilevel disc space narrowing and facet hypertrophic changes are seen.  No acute fracture is noted.  No acute facet abnormality is seen.  The surrounding soft tissues demonstrate bilateral carotid calcifications.  There is again enlargement of the thyroid gland most consistent with a goiter.  The visualized lung apices are unremarkable.  IMPRESSION: Degenerative change without acute abnormality.   Original Report Authenticated By: Alcide Clever, M.D.   Ct Cervical Spine Wo Contrast  11/08/2012   *RADIOLOGY REPORT*  Clinical Data:  Recent traumatic injury with pain  CT HEAD WITHOUT CONTRAST CT MAXILLOFACIAL WITHOUT CONTRAST CT CERVICAL SPINE WITHOUT CONTRAST  Technique:  Multidetector CT imaging of the head, cervical spine, and maxillofacial structures were performed using the standard protocol without intravenous contrast. Multiplanar CT image reconstructions of the cervical spine and maxillofacial structures were also generated.  Comparison:  11/11/2008  CT HEAD  Findings: The bony calvarium is intact.  Atrophic changes and chronic white matter ischemic change is seen and stable from prior exam. The findings to suggest acute hemorrhage, acute infarction or space-occupying mass lesion are noted.  IMPRESSION: Chronic changes without acute abnormality.  CT MAXILLOFACIAL  Findings:  The bony structures are within normal limits.  No acute fracture is seen.  The surrounding soft tissues demonstrate the thyroid gland to the prominent no other soft tissue abnormality is seen.  No lymphadenopathy is noted.  IMPRESSION: No acute bony abnormality noted.  Findings suggestive of a thyroid goiter.  CT CERVICAL SPINE  Findings:   Seven cervical segments are well visualized.  Vertebral body height is well-maintained.   Multilevel disc space narrowing and facet hypertrophic changes are seen.  No acute fracture is noted.  No acute facet abnormality is seen.  The surrounding soft tissues demonstrate bilateral carotid calcifications.  There is again enlargement of the thyroid gland most consistent with a goiter.  The visualized lung apices are unremarkable.  IMPRESSION: Degenerative change without acute abnormality.   Original Report Authenticated By: Alcide Clever, M.D.   Ct Maxillofacial Wo Cm  11/08/2012   *RADIOLOGY REPORT*  Clinical Data:  Recent traumatic injury with pain  CT HEAD WITHOUT CONTRAST CT MAXILLOFACIAL WITHOUT CONTRAST CT CERVICAL SPINE WITHOUT CONTRAST  Technique:  Multidetector CT imaging of the head, cervical spine, and maxillofacial structures were performed using the standard protocol without intravenous contrast. Multiplanar CT image reconstructions of the cervical spine and maxillofacial structures were also generated.  Comparison:  11/11/2008  CT HEAD  Findings: The bony calvarium is intact.  Atrophic changes and chronic white matter ischemic change is seen and stable from prior exam. The findings to suggest acute hemorrhage, acute infarction or space-occupying mass lesion are noted.  IMPRESSION: Chronic changes without acute abnormality.  CT MAXILLOFACIAL  Findings:  The bony structures are within normal limits.  No acute fracture is seen.  The surrounding soft tissues demonstrate the thyroid gland to the prominent no other soft tissue abnormality is seen.  No lymphadenopathy is noted.  IMPRESSION: No acute bony abnormality noted.  Findings suggestive of a thyroid goiter.  CT  CERVICAL SPINE  Findings:   Seven cervical segments are well visualized.  Vertebral body height is well-maintained.  Multilevel disc space narrowing and facet hypertrophic changes are seen.  No acute fracture is noted.  No acute facet abnormality is seen.  The surrounding soft tissues demonstrate bilateral carotid calcifications.  There  is again enlargement of the thyroid gland most consistent with a goiter.  The visualized lung apices are unremarkable.  IMPRESSION: Degenerative change without acute abnormality.   Original Report Authenticated By: Alcide Clever, M.D.    @ROS @ Not able to obtain at this time. Blood pressure 156/94, pulse 116, temperature 98.7 F (37.1 C), temperature source Oral, resp. rate 13, SpO2 95.00%. Constitutional: Averagely built and nourished. Mild distress from hip pain. HENT: [place]/AT, [employer], PERL, EOMI. Bil. Lens implants. Dry tongue Neck: Neck supple.  Cardiovascular: Normal rate and regular rhythm.  Pulmonary/Chest: Effort normal and breath sounds normal. No respiratory distress. She has no wheezes. She has no rales.  Abdominal: Soft. She exhibits no distension and no mass. There is no tenderness. There is no rebound and no guarding.  Musculoskeletal: Right shoulder: She exhibits tenderness.  Right hip: She exhibits questionable tenderness.  Left hip: She exhibits decreased range of motion, decreased strength, tenderness and bony tenderness. Left leg slightly shortened, externally rotated. Pain with palpation and passive ROM. Distal sensation and pulses intact. Left knee: No tenderness found. Right knee-tender on palpation. Left ankle: No tenderness.  Cervical back: She exhibits no tenderness and no bony tenderness. Right shoulder with ecchymosis, tender.  No other noted tenderness throughout extremity exam. Sensation and distal pulses are intact. Neurological: She is alert.  Skin: Warm and dry. Decreased turgor.    Assessment/Plan Displaced, closed left hip intertrochanteric fracture Dehydration Hypertension CAD  Admit/IV fluids/Pain medication/Orthoconsult in AM/Strict bed rest  Cassidee Deats S 11/08/2012, 10:19 PM

## 2012-11-08 NOTE — ED Notes (Signed)
Admitting doctor at bedside 

## 2012-11-08 NOTE — ED Notes (Signed)
Phlebotomy informed we need Troponin drawn on pt.

## 2012-11-08 NOTE — ED Notes (Addendum)
Per EMS, pt from home where she lives alone.  Pt states she got up sometime last night to fix a cup of coffee and lost her balance and fell.  Pt's family found her after church today on the floor.  Pt had urinated and defecated on herself.  Pt was on R side and has a skin tear and bruising, but primarily c/o L hip pain.  L leg is shortened and rotated.  Pt given Fentanyl x 2 by EMS prior to arrival.  12 lead demonstrates SR with LBBB, PVC's, and PAC's.

## 2012-11-08 NOTE — ED Provider Notes (Signed)
Medical screening examination/treatment/procedure(s) were conducted as a shared visit with non-physician practitioner(s) and myself.  I personally evaluated the patient during the encounter  Presented for evaluation of hip pain after a fall. Workup revealed hip fracture requiring hospitalization.  Gilda Crease, MD 11/08/12 2153

## 2012-11-08 NOTE — ED Provider Notes (Signed)
CSN: [id number]     Arrival date & time 11/08/12  1631 History     First MD Initiated Contact with Patient 11/08/12 1632     Chief Complaint  Patient presents with  . Fall  . Hip Pain   (Consider location/radiation/quality/duration/timing/severity/associated sxs/prior Treatment) HPI Comments: Patient is a 79 year old woman who lives alone at home, has been having increased confusion over the past few months per family.  Patient states she was up fixing coffee last night when she lost her balance and fell.  Denies LOC.  Reports pain in bilateral hips, right shoulder.  Pt both admits to and denies chest pain and shortness of breath - contradicting herself several times during our conversation.  Denies abdominal pain.  Family member states she found the patient screaming on the ground in the kitchen, complaining of pain and stating she could not get up.    Level V caveat for confusion vs dementia  Patient is a 79 y.o. female presenting with fall and hip pain. The history is provided by the patient and a relative.  Fall  Hip Pain    Past Medical History  Diagnosis Date  . Coronary artery disease   . CHF (congestive heart failure)   . Hypertension    No past surgical history on file. No family history on file. History  Substance Use Topics  . Smoking status: Never Smoker   . Smokeless tobacco: Not on file  . Alcohol Use: No   OB History   Grav Para Term Preterm Abortions TAB SAB Ect Mult Living                 Review of Systems  Unable to perform ROS: Dementia   Level V caveat for dementia vs confusion  Allergies  Keflex and Penicillins  Home Medications   Current Outpatient Rx  Name  Route  Sig  Dispense  Refill  . amLODipine-benazepril (LOTREL) 5-20 MG per capsule   Oral   Take 1 capsule by mouth daily.         . cloNIDine (CATAPRES) 0.2 MG tablet   Oral   Take 0.4 mg by mouth at bedtime.         . digoxin (LANOXIN) 0.125 MG tablet   Oral   Take 125 mcg  by mouth daily.         Marland Kitchen EXPIRED: furosemide (LASIX) 20 MG tablet   Oral   Take 1 tablet (20 mg total) by mouth daily.   15 tablet   0   . metoprolol tartrate (LOPRESSOR) 25 MG tablet   Oral   Take 25 mg by mouth 2 (two) times daily.          BP 173/104  Pulse 82  Temp(Src) 98.3 F (36.8 C) (Oral)  Resp 14  SpO2 96% Physical Exam  Nursing note and vitals reviewed. Constitutional: She appears well-developed and well-nourished. No distress.  HENT:  Head: Normocephalic.    Neck: Neck supple.  Cardiovascular: Normal rate and regular rhythm.   Pulmonary/Chest: Effort normal and breath sounds normal. No respiratory distress. She has no wheezes. She has no rales.  Abdominal: Soft. She exhibits no distension and no mass. There is no tenderness. There is no rebound and no guarding.  Musculoskeletal:       Right shoulder: She exhibits tenderness.       Right hip: She exhibits no tenderness.       Left hip: She exhibits decreased range of motion, decreased  strength, tenderness and bony tenderness.       Left knee: No tenderness found.       Left ankle: No tenderness.       Cervical back: She exhibits no tenderness and no bony tenderness.       Arms:      Left upper leg: She exhibits no tenderness and no bony tenderness.       Left foot: She exhibits no tenderness.  Left leg slightly shortened, externally rotated.  Pain with palpation and passive ROM.  Distal sensation and pulses intact.    Right shoulder with ecchymosis, tender.   No other noted tenderness throughout extremity exam.  Sensation and distal pulses are intact.   Spine without crepitus or stepoffs.  Exam challenging as patient's left hip and right shoulder are tender, she screams with pain with any attempt to roll or move her, saying her hip hips.  Pt does deny back pain.   Neurological: She is alert.  Skin: She is not diaphoretic.    ED Course   Procedures (including critical care time)  Labs Reviewed    CBC WITH DIFFERENTIAL - Abnormal; Notable for the following:    WBC 12.4 (*)    RBC 3.50 (*)    Hemoglobin 11.0 (*)    HCT 31.0 (*)    Neutrophils Relative % 80 (*)    Neutro Abs 9.9 (*)    Lymphocytes Relative 11 (*)    Monocytes Absolute 1.2 (*)    All other components within normal limits  CK - Abnormal; Notable for the following:    Total CK 1920 (*)    All other components within normal limits  COMPREHENSIVE METABOLIC PANEL - Abnormal; Notable for the following:    Glucose, Bld 120 (*)    AST 58 (*)    GFR calc non Af Amer 71 (*)    GFR calc Af Amer 82 (*)    All other components within normal limits  URINALYSIS, ROUTINE W REFLEX MICROSCOPIC - Abnormal; Notable for the following:    Color, Urine AMBER (*)    APPearance CLOUDY (*)    Hgb urine dipstick LARGE (*)    Bilirubin Urine SMALL (*)    Ketones, ur 15 (*)    Protein, ur 100 (*)    All other components within normal limits  URINE CULTURE  URINE MICROSCOPIC-ADD ON   Dg Chest 1 View  11/08/2012   *RADIOLOGY REPORT*  Clinical Data: Fall, left hip/right shoulder pain  CHEST - 1 VIEW  Comparison: 09/18/2011.  Findings: Lungs are clear. No pleural effusion or pneumothorax.  Cardiomegaly.  Thoracic dextroscoliosis.  IMPRESSION: No evidence of acute cardiopulmonary disease.   Original Report Authenticated By: Charline Bills, M.D.   Dg Shoulder Right  11/08/2012   *RADIOLOGY REPORT*  Clinical Data: Right shoulder pain.  RIGHT SHOULDER - 2+ VIEW  Comparison: None  Findings: The joint spaces are maintained.  No acute fracture.  The right lung is clear.  IMPRESSION: No fracture or dislocation.   Original Report Authenticated By: Rudie Meyer, M.D.   Dg Hip Complete Left  11/08/2012   *RADIOLOGY REPORT*  Clinical Data: Larey Seat.  Left hip pain.  LEFT HIP - COMPLETE 2+ VIEW  Comparison: None  Findings: There is a displaced intertrochanteric fracture of the left hip.  The pubic symphysis and SI joints are intact.  No definite pelvic  fractures. Calcified uterine fibroids are noted.  IMPRESSION: Displaced intertrochanteric fracture.   Original Report Authenticated By: P.  Pecolia Ades, M.D.   Ct Head Wo Contrast  11/08/2012   *RADIOLOGY REPORT*  Clinical Data:  Recent traumatic injury with pain  CT HEAD WITHOUT CONTRAST CT MAXILLOFACIAL WITHOUT CONTRAST CT CERVICAL SPINE WITHOUT CONTRAST  Technique:  Multidetector CT imaging of the head, cervical spine, and maxillofacial structures were performed using the standard protocol without intravenous contrast. Multiplanar CT image reconstructions of the cervical spine and maxillofacial structures were also generated.  Comparison:  11/11/2008  CT HEAD  Findings: The bony calvarium is intact.  Atrophic changes and chronic white matter ischemic change is seen and stable from prior exam. The findings to suggest acute hemorrhage, acute infarction or space-occupying mass lesion are noted.  IMPRESSION: Chronic changes without acute abnormality.  CT MAXILLOFACIAL  Findings:  The bony structures are within normal limits.  No acute fracture is seen.  The surrounding soft tissues demonstrate the thyroid gland to the prominent no other soft tissue abnormality is seen.  No lymphadenopathy is noted.  IMPRESSION: No acute bony abnormality noted.  Findings suggestive of a thyroid goiter.  CT CERVICAL SPINE  Findings:   Seven cervical segments are well visualized.  Vertebral body height is well-maintained.  Multilevel disc space narrowing and facet hypertrophic changes are seen.  No acute fracture is noted.  No acute facet abnormality is seen.  The surrounding soft tissues demonstrate bilateral carotid calcifications.  There is again enlargement of the thyroid gland most consistent with a goiter.  The visualized lung apices are unremarkable.  IMPRESSION: Degenerative change without acute abnormality.   Original Report Authenticated By: Alcide Clever, M.D.   Ct Cervical Spine Wo Contrast  11/08/2012   *RADIOLOGY REPORT*   Clinical Data:  Recent traumatic injury with pain  CT HEAD WITHOUT CONTRAST CT MAXILLOFACIAL WITHOUT CONTRAST CT CERVICAL SPINE WITHOUT CONTRAST  Technique:  Multidetector CT imaging of the head, cervical spine, and maxillofacial structures were performed using the standard protocol without intravenous contrast. Multiplanar CT image reconstructions of the cervical spine and maxillofacial structures were also generated.  Comparison:  11/11/2008  CT HEAD  Findings: The bony calvarium is intact.  Atrophic changes and chronic white matter ischemic change is seen and stable from prior exam. The findings to suggest acute hemorrhage, acute infarction or space-occupying mass lesion are noted.  IMPRESSION: Chronic changes without acute abnormality.  CT MAXILLOFACIAL  Findings:  The bony structures are within normal limits.  No acute fracture is seen.  The surrounding soft tissues demonstrate the thyroid gland to the prominent no other soft tissue abnormality is seen.  No lymphadenopathy is noted.  IMPRESSION: No acute bony abnormality noted.  Findings suggestive of a thyroid goiter.  CT CERVICAL SPINE  Findings:   Seven cervical segments are well visualized.  Vertebral body height is well-maintained.  Multilevel disc space narrowing and facet hypertrophic changes are seen.  No acute fracture is noted.  No acute facet abnormality is seen.  The surrounding soft tissues demonstrate bilateral carotid calcifications.  There is again enlargement of the thyroid gland most consistent with a goiter.  The visualized lung apices are unremarkable.  IMPRESSION: Degenerative change without acute abnormality.   Original Report Authenticated By: Alcide Clever, M.D.   Ct Maxillofacial Wo Cm  11/08/2012   *RADIOLOGY REPORT*  Clinical Data:  Recent traumatic injury with pain  CT HEAD WITHOUT CONTRAST CT MAXILLOFACIAL WITHOUT CONTRAST CT CERVICAL SPINE WITHOUT CONTRAST  Technique:  Multidetector CT imaging of the head, cervical spine, and  maxillofacial structures were performed using the standard protocol  without intravenous contrast. Multiplanar CT image reconstructions of the cervical spine and maxillofacial structures were also generated.  Comparison:  11/11/2008  CT HEAD  Findings: The bony calvarium is intact.  Atrophic changes and chronic white matter ischemic change is seen and stable from prior exam. The findings to suggest acute hemorrhage, acute infarction or space-occupying mass lesion are noted.  IMPRESSION: Chronic changes without acute abnormality.  CT MAXILLOFACIAL  Findings:  The bony structures are within normal limits.  No acute fracture is seen.  The surrounding soft tissues demonstrate the thyroid gland to the prominent no other soft tissue abnormality is seen.  No lymphadenopathy is noted.  IMPRESSION: No acute bony abnormality noted.  Findings suggestive of a thyroid goiter.  CT CERVICAL SPINE  Findings:   Seven cervical segments are well visualized.  Vertebral body height is well-maintained.  Multilevel disc space narrowing and facet hypertrophic changes are seen.  No acute fracture is noted.  No acute facet abnormality is seen.  The surrounding soft tissues demonstrate bilateral carotid calcifications.  There is again enlargement of the thyroid gland most consistent with a goiter.  The visualized lung apices are unremarkable.  IMPRESSION: Degenerative change without acute abnormality.   Original Report Authenticated By: Alcide Clever, M.D.    5:00 PM Discussed pt with Dr Blinda Leatherwood   Date: 11/08/2012  Rate: 84  Rhythm: normal sinus rhythm and frequent ectopy  QRS Axis: left  Intervals: normal  ST/T Wave abnormalities: nonspecific ST/T changes  Conduction Disutrbances:left bundle branch block  Narrative Interpretation:   Old EKG Reviewed: changes noted   Pt reassessed for pain and offered medication several times.  Pt declined pain medication stating she was worried about getting addicted.    1. Fall, initial  encounter   2. Intertrochanteric fracture of left hip, closed, initial encounter     MDM  Elderly patient who lives alone fell during the night, found on the ground today by daughter.  Pt has had increasing confusion ?dementia over the past few months, unclear how she fell though she states she lost her balance.  Pt does walk with a walker.  Pt does deny LOC consistently.  Patient found to have displaced intertrochanteric left hip fracture.  Patient's family does not want her to have surgery as they have been told by Dr Shana Chute that she should not have surgery ever again.  Per nurse, the most recent message from family is that they would like to speak with Dr Shana Chute prior to the decision being made about surgery.  Patient and family have been made aware that patient will need surgery in order to walk again.  I spoke with Dr Algie Coffer who is on call for Dr Shana Chute.  He has agreed to admit the patient and will come to ED now to see her.  I have made him aware that I did not speak with an orthopedist given patient and family's desire to delay surgery pending conversation with PCP.  Patient would need medical clearance prior to surgery and Dr Algie Coffer is on his way, so I think this is a reasonable plan.  Left leg is neurovascularly intact.  Workup and exam otherwise unremarkable.         Trixie Dredge, PA-C 11/08/12 2151

## 2012-11-08 NOTE — ED Notes (Signed)
Phlebotomy informed we need an I-stat troponin from pt.

## 2012-11-08 NOTE — ED Notes (Signed)
Pt's family spoke with admitting doctor and were able to express their concerns about surgery.

## 2012-11-08 NOTE — ED Notes (Signed)
Unable to move pt to 6N at this time, pt's POCT troponin is 0.10. MD consulted. MD would like the pt to stay in the department for [place] to redraw a troponin and compare it to the POCT troponin before moving the pt off the floor. 6N receiving RN notified of delay.

## 2012-11-08 NOTE — ED Notes (Signed)
Pt returned from radiology.

## 2012-11-08 NOTE — ED Notes (Signed)
Four attempts by two nurses and two nurse tech's unable to insert foley catheter. EDP notified. Verbal order in and cath for urine. Patient tolerated without incident. Given pain medication during attempt for left hip pain.

## 2012-11-09 ENCOUNTER — Inpatient Hospital Stay (HOSPITAL_COMMUNITY): Payer: Medicare Other

## 2012-11-09 ENCOUNTER — Encounter (HOSPITAL_COMMUNITY): Payer: Self-pay | Admitting: Anesthesiology

## 2012-11-09 ENCOUNTER — Inpatient Hospital Stay (HOSPITAL_COMMUNITY): Payer: Medicare Other | Admitting: Anesthesiology

## 2012-11-09 ENCOUNTER — Encounter (HOSPITAL_COMMUNITY): Payer: Self-pay | Admitting: *Deleted

## 2012-11-09 ENCOUNTER — Encounter (HOSPITAL_COMMUNITY)
Admission: EM | Disposition: A | Discharge status: Skilled Nursing Facility | Payer: Self-pay | Source: Home / Self Care | Attending: Cardiovascular Disease

## 2012-11-09 HISTORY — PX: FEMUR IM NAIL: SHX1597

## 2012-11-09 LAB — PROTIME-INR
INR: 1.14 (ref 0.00–1.49)
Prothrombin Time: 14.4 seconds (ref 11.6–15.2)

## 2012-11-09 LAB — CREATININE, SERUM
Creatinine, Ser: 0.96 mg/dL (ref 0.50–1.10)
GFR calc non Af Amer: 49 mL/min — ABNORMAL LOW (ref >90)

## 2012-11-09 LAB — TROPONIN I: Troponin I: <0.3 ng/mL (ref <0.30)

## 2012-11-09 LAB — CBC
HCT: 30.6 % — ABNORMAL LOW (ref 36.0–46.0)
Hemoglobin: 10 g/dL — ABNORMAL LOW (ref 12.0–15.0)
MCHC: 34.9 g/dL (ref 30.0–36.0)
Platelets: ADEQUATE 10*3/uL (ref 150–400)
RBC: 3.44 MIL/uL — ABNORMAL LOW (ref 3.87–5.11)
RDW: 13.7 % (ref 11.5–15.5)
WBC: 8.1 10*3/uL (ref 4.0–10.5)

## 2012-11-09 LAB — BASIC METABOLIC PANEL
Chloride: 101 mEq/L (ref 96–112)
GFR calc Af Amer: 65 mL/min — ABNORMAL LOW (ref >90)
Potassium: 3.1 mEq/L — ABNORMAL LOW (ref 3.5–5.1)
Sodium: 137 mEq/L (ref 135–145)

## 2012-11-09 SURGERY — INSERTION, INTRAMEDULLARY ROD, FEMUR
Anesthesia: Spinal | Site: Hip | Laterality: Left | Wound class: Clean

## 2012-11-09 MED ORDER — KCL IN DEXTROSE-NACL 20-5-0.45 MEQ/L-%-% IV SOLN
INTRAVENOUS | Status: DC
  Administered 2012-11-09: 11:00:00 via INTRAVENOUS
  Filled 2012-11-09 (×2): qty 1000

## 2012-11-09 MED ORDER — PHENYLEPHRINE HCL 10 MG/ML IJ SOLN
INTRAMUSCULAR | Status: DC | PRN
  Administered 2012-11-09: 80 ug via INTRAVENOUS

## 2012-11-09 MED ORDER — SODIUM CHLORIDE 0.9 % IV SOLN
10.0000 mg | INTRAVENOUS | Status: DC | PRN
  Administered 2012-11-09: 10 ug/min via INTRAVENOUS

## 2012-11-09 MED ORDER — BIOTENE DRY MOUTH MT LIQD
15.0000 mL | Freq: Two times a day (BID) | OROMUCOSAL | Status: DC
  Administered 2012-11-09 (×2): 15 mL via OROMUCOSAL

## 2012-11-09 MED ORDER — SODIUM CHLORIDE 0.9 % IV SOLN: INTRAVENOUS | Status: DC

## 2012-11-09 MED ORDER — ENOXAPARIN SODIUM 30 MG/0.3ML ~~LOC~~ SOLN
30.0000 mg | SUBCUTANEOUS | Status: DC
  Administered 2012-11-10 – 2012-11-16 (×7): 30 mg via SUBCUTANEOUS
  Filled 2012-11-09 (×10): qty 0.3

## 2012-11-09 MED ORDER — PROPOFOL INFUSION 10 MG/ML OPTIME
INTRAVENOUS | Status: DC | PRN
  Administered 2012-11-09: 50 ug/kg/min via INTRAVENOUS

## 2012-11-09 MED ORDER — ALUM & MAG HYDROXIDE-SIMETH 200-200-20 MG/5ML PO SUSP: 30.0000 mL | Freq: Four times a day (QID) | ORAL | Status: DC | PRN

## 2012-11-09 MED ORDER — SENNA 8.6 MG PO TABS
2.0000 | ORAL_TABLET | Freq: Two times a day (BID) | ORAL | Status: DC
  Administered 2012-11-09 – 2012-11-16 (×14): 17.2 mg via ORAL
  Filled 2012-11-09: qty 2
  Filled 2012-11-09: qty 1
  Filled 2012-11-09 (×4): qty 2
  Filled 2012-11-09: qty 1
  Filled 2012-11-09 (×6): qty 2
  Filled 2012-11-09: qty 1
  Filled 2012-11-09: qty 2
  Filled 2012-11-09: qty 1
  Filled 2012-11-09: qty 2
  Filled 2012-11-09: qty 1
  Filled 2012-11-09 (×2): qty 2

## 2012-11-09 MED ORDER — DOUBLE ANTIBIOTIC 500-10000 UNIT/GM EX OINT
TOPICAL_OINTMENT | CUTANEOUS | Status: AC
  Filled 2012-11-09: qty 1

## 2012-11-09 MED ORDER — MENTHOL 3 MG MT LOZG: 1.0000 | LOZENGE | OROMUCOSAL | Status: DC | PRN

## 2012-11-09 MED ORDER — ONDANSETRON HCL 4 MG/2ML IJ SOLN
INTRAMUSCULAR | Status: DC | PRN
  Administered 2012-11-09: 4 mg via INTRAVENOUS

## 2012-11-09 MED ORDER — ACETAMINOPHEN 650 MG RE SUPP: 650.0000 mg | Freq: Four times a day (QID) | RECTAL | Status: DC | PRN

## 2012-11-09 MED ORDER — BACITRACIN ZINC 500 UNIT/GM EX OINT
TOPICAL_OINTMENT | CUTANEOUS | Status: DC | PRN
  Administered 2012-11-09: 1 via TOPICAL

## 2012-11-09 MED ORDER — SODIUM CHLORIDE 0.9 % IV SOLN
INTRAVENOUS | Status: DC
  Administered 2012-11-10: 15:00:00 via INTRAVENOUS
  Administered 2012-11-10: 75 mL/h via INTRAVENOUS
  Administered 2012-11-12 – 2012-11-15 (×4): via INTRAVENOUS

## 2012-11-09 MED ORDER — 0.9 % SODIUM CHLORIDE (POUR BTL) OPTIME
TOPICAL | Status: DC | PRN
  Administered 2012-11-09: 1000 mL

## 2012-11-09 MED ORDER — VANCOMYCIN HCL IN DEXTROSE 1-5 GM/200ML-% IV SOLN
1000.0000 mg | INTRAVENOUS | Status: AC
Start: 2012-11-10 — End: 2012-11-09
  Administered 2012-11-09: 1000 mg via INTRAVENOUS
  Filled 2012-11-09 (×2): qty 200

## 2012-11-09 MED ORDER — ONDANSETRON HCL 4 MG/2ML IJ SOLN: 4.0000 mg | Freq: Four times a day (QID) | INTRAMUSCULAR | Status: DC | PRN

## 2012-11-09 MED ORDER — ONDANSETRON HCL 4 MG PO TABS: 4.0000 mg | ORAL_TABLET | Freq: Four times a day (QID) | ORAL | Status: DC | PRN

## 2012-11-09 MED ORDER — DEXTROSE-NACL 5-0.45 % IV SOLN
INTRAVENOUS | Status: DC
  Administered 2012-11-09: 02:00:00 via INTRAVENOUS

## 2012-11-09 MED ORDER — PHENOL 1.4 % MT LIQD: 1.0000 | OROMUCOSAL | Status: DC | PRN

## 2012-11-09 MED ORDER — CHLORHEXIDINE GLUCONATE 4 % EX LIQD
60.0000 mL | Freq: Once | CUTANEOUS | Status: DC
  Filled 2012-11-09: qty 60

## 2012-11-09 MED ORDER — FENTANYL CITRATE 0.05 MG/ML IJ SOLN: 25.0000 ug | INTRAMUSCULAR | Status: DC | PRN

## 2012-11-09 MED ORDER — MORPHINE SULFATE 2 MG/ML IJ SOLN
0.5000 mg | INTRAMUSCULAR | Status: DC | PRN
  Administered 2012-11-09 – 2012-11-15 (×13): 0.5 mg via INTRAVENOUS
  Filled 2012-11-09 (×13): qty 1

## 2012-11-09 MED ORDER — HYDROMORPHONE HCL PF 1 MG/ML IJ SOLN
0.5000 mg | INTRAMUSCULAR | Status: DC | PRN
  Administered 2012-11-09: 0.5 mg via INTRAVENOUS
  Filled 2012-11-09: qty 1

## 2012-11-09 MED ORDER — ACETAMINOPHEN 325 MG PO TABS: 650.0000 mg | ORAL_TABLET | Freq: Four times a day (QID) | ORAL | Status: DC | PRN

## 2012-11-09 MED ORDER — OXYCODONE HCL 5 MG PO TABS: 5.0000 mg | ORAL_TABLET | ORAL | Status: DC | PRN

## 2012-11-09 MED ORDER — DOCUSATE SODIUM 100 MG PO CAPS
100.0000 mg | ORAL_CAPSULE | Freq: Two times a day (BID) | ORAL | Status: DC
  Administered 2012-11-09 – 2012-11-14 (×12): 100 mg via ORAL
  Filled 2012-11-09 (×14): qty 1

## 2012-11-09 MED ORDER — FENTANYL CITRATE 0.05 MG/ML IJ SOLN
INTRAMUSCULAR | Status: DC | PRN
  Administered 2012-11-09: 50 ug via INTRAVENOUS

## 2012-11-09 MED ORDER — POTASSIUM CHLORIDE CRYS ER 20 MEQ PO TBCR
20.0000 meq | EXTENDED_RELEASE_TABLET | Freq: Three times a day (TID) | ORAL | Status: DC
  Filled 2012-11-09: qty 1

## 2012-11-09 MED ORDER — HEPARIN SODIUM (PORCINE) 5000 UNIT/ML IJ SOLN
5000.0000 [IU] | Freq: Three times a day (TID) | INTRAMUSCULAR | Status: DC
  Administered 2012-11-09: 5000 [IU] via SUBCUTANEOUS
  Filled 2012-11-09 (×3): qty 1

## 2012-11-09 MED ORDER — HYDROCODONE-ACETAMINOPHEN 5-325 MG PO TABS
1.0000 | ORAL_TABLET | Freq: Four times a day (QID) | ORAL | Status: DC | PRN
  Administered 2012-11-09 – 2012-11-16 (×13): 1 via ORAL
  Filled 2012-11-09 (×14): qty 1

## 2012-11-09 MED ORDER — LACTATED RINGERS IV SOLN
INTRAVENOUS | Status: DC | PRN
  Administered 2012-11-09: 15:00:00 via INTRAVENOUS

## 2012-11-09 MED ORDER — KETOROLAC TROMETHAMINE 15 MG/ML IJ SOLN
15.0000 mg | Freq: Once | INTRAMUSCULAR | Status: DC | PRN
  Filled 2012-11-09: qty 1

## 2012-11-09 MED ORDER — POTASSIUM CHLORIDE 20 MEQ/15ML (10%) PO LIQD
20.0000 meq | Freq: Three times a day (TID) | ORAL | Status: AC
  Administered 2012-11-09 – 2012-11-10 (×5): 20 meq via ORAL
  Filled 2012-11-09 (×7): qty 15

## 2012-11-09 SURGICAL SUPPLY — 48 items
BANDAGE GAUZE ELAST BULKY 4 IN (GAUZE/BANDAGES/DRESSINGS) IMPLANT
BIT DRILL CANN LG 4.3MM (BIT) ×1 IMPLANT
BNDG COHESIVE 6X5 TAN STRL LF (GAUZE/BANDAGES/DRESSINGS) ×2 IMPLANT
CHLORAPREP W/TINT 26ML (MISCELLANEOUS) ×2 IMPLANT
CLOTH BEACON ORANGE TIMEOUT ST (SAFETY) ×2 IMPLANT
COVER MAYO STAND STRL (DRAPES) IMPLANT
COVER SURGICAL LIGHT HANDLE (MISCELLANEOUS) ×4 IMPLANT
DRAPE INCISE IOBAN 66X45 STRL (DRAPES) IMPLANT
DRAPE PROXIMA HALF (DRAPES) IMPLANT
DRAPE STERI IOBAN 125X83 (DRAPES) ×2 IMPLANT
DRAPE U-SHAPE 47X51 STRL (DRAPES) ×2 IMPLANT
DRILL BIT CANN LG 4.3MM (BIT) ×2
DRSG ADAPTIC 3X8 NADH LF (GAUZE/BANDAGES/DRESSINGS) ×2 IMPLANT
DRSG MEPILEX BORDER 4X4 (GAUZE/BANDAGES/DRESSINGS) ×8 IMPLANT
DRSG MEPILEX BORDER 4X8 (GAUZE/BANDAGES/DRESSINGS) ×2 IMPLANT
ELECT REM PT RETURN 9FT ADLT (ELECTROSURGICAL) ×2
ELECTRODE REM PT RTRN 9FT ADLT (ELECTROSURGICAL) ×1 IMPLANT
EVACUATOR 1/8 PVC DRAIN (DRAIN) IMPLANT
FACESHIELD LNG OPTICON STERILE (SAFETY) IMPLANT
GLOVE BIO SURGEON STRL SZ8 (GLOVE) ×2 IMPLANT
GLOVE BIOGEL PI IND STRL 6.5 (GLOVE) ×2 IMPLANT
GLOVE BIOGEL PI IND STRL 8 (GLOVE) ×1 IMPLANT
GLOVE BIOGEL PI INDICATOR 6.5 (GLOVE) ×2
GLOVE BIOGEL PI INDICATOR 8 (GLOVE) ×1
GOWN PREVENTION PLUS XLARGE (GOWN DISPOSABLE) IMPLANT
GOWN STRL NON-REIN LRG LVL3 (GOWN DISPOSABLE) IMPLANT
GUIDEPIN 3.2X17.5 THRD DISP (PIN) ×2 IMPLANT
HIP FRAC NAIL LAG SCR 10.5X100 (Orthopedic Implant) ×1 IMPLANT
KIT ROOM TURNOVER OR (KITS) ×2 IMPLANT
MANIFOLD NEPTUNE II (INSTRUMENTS) IMPLANT
NAIL HIP FRACT 130D 11X180 (Screw) ×2 IMPLANT
NS IRRIG 1000ML POUR BTL (IV SOLUTION) ×2 IMPLANT
PACK GENERAL/GYN (CUSTOM PROCEDURE TRAY) ×2 IMPLANT
PAD ARMBOARD 7.5X6 YLW CONV (MISCELLANEOUS) ×4 IMPLANT
PADDING CAST SYNTHETIC 4 (CAST SUPPLIES) ×1
PADDING CAST SYNTHETIC 4X4 STR (CAST SUPPLIES) ×1 IMPLANT
SCREW BONE CORTICAL 5.0X3 (Screw) ×2 IMPLANT
SCREW CANN THRD AFF 10.5X100 (Orthopedic Implant) ×1 IMPLANT
STAPLER VISISTAT 35W (STAPLE) ×2 IMPLANT
SUT MNCRL AB 4-0 PS2 18 (SUTURE) IMPLANT
SUT PROLENE 3 0 PS 1 (SUTURE) IMPLANT
SUT VIC AB 0 CT1 27 (SUTURE) ×1
SUT VIC AB 0 CT1 27XBRD ANBCTR (SUTURE) ×1 IMPLANT
SUT VIC AB 2-0 CT1 27 (SUTURE) ×1
SUT VIC AB 2-0 CT1 TAPERPNT 27 (SUTURE) ×1 IMPLANT
TOWEL OR 17X24 6PK STRL BLUE (TOWEL DISPOSABLE) ×2 IMPLANT
TOWEL OR 17X26 10 PK STRL BLUE (TOWEL DISPOSABLE) ×2 IMPLANT
WATER STERILE IRR 1000ML POUR (IV SOLUTION) IMPLANT

## 2012-11-09 NOTE — Preoperative (Signed)
Beta Blockers   Reason not to administer Beta Blockers:Not Applicable 

## 2012-11-09 NOTE — Anesthesia Postprocedure Evaluation (Signed)
  Anesthesia Post-op Note  Patient: Danielle [name]  Procedure(s) Performed: Procedure(s): INTRAMEDULLARY (IM) NAIL FEMORAL/Left (Left)  Patient Location: PACU  Anesthesia Type:Spinal  Level of Consciousness: awake, alert , oriented, patient cooperative and responds to stimulation  Airway and Oxygen Therapy: Patient Spontanous Breathing and Patient connected to nasal cannula oxygen  Post-op Pain: none  Post-op Assessment: Post-op Vital signs reviewed, Patient's Cardiovascular Status Stable, Respiratory Function Stable, Patent Airway, No signs of Nausea or vomiting and Pain level controlled, able to wiggle toes/spinal receding  Post-op Vital Signs: Reviewed and stable  Complications: No apparent anesthesia complications

## 2012-11-09 NOTE — Progress Notes (Signed)
Subjective:  Feeling better. Accepts orthopedic consult for possible surgery. X-ray right hip and knee without fracture.  Objective:  Vital Signs in the last 24 hours: Temp:  [98.1 F (36.7 C)-98.7 F (37.1 C)] 98.1 F (36.7 C) (08/04 0655) Pulse Rate:  [63-116] 64 (08/04 0655) Cardiac Rhythm:  [-] Atrial fibrillation (08/04 0215) Resp:  [13-29] 16 (08/04 0655) BP: (145-185)/(77-104) 159/78 mmHg (08/04 0655) SpO2:  [94 %-100 %] 96 % (08/04 0655) Weight:  [57.244 kg (126 lb 3.2 oz)] 57.244 kg (126 lb 3.2 oz) (08/04 1610)  Physical Exam: BP Readings from Last 1 Encounters:  11/09/12 159/78     Wt Readings from Last 1 Encounters:  11/09/12 57.244 kg (126 lb 3.2 oz)    Weight change:   HEENT: [place]/AT, Eyes-Brown, PERL, EOMI, Conjunctiva-Pale pink, Sclera-Non-icteric Neck: No JVD, No bruit, Trachea midline. Lungs:  Clear, Bilateral. Cardiac:  Regular rhythm, normal S1 and S2, no S3.  Abdomen:  Soft, non-tender. Extremities:  No edema present. No cyanosis. No clubbing. Left hip tenderness as before. CNS: AxOx3, Cranial nerves grossly intact, moves all 4 extremities. Right handed. Skin: Warm and dry.   Intake/Output from previous day: 08/03 0701 - 08/04 0700 In: 1445 [P.O.:75; I.V.:1370] Out: 250 [Urine:250]    Lab Results: BMET    Component Value Date/Time   NA 137 11/09/2012 0550   K 3.1* 11/09/2012 0550   CL 101 11/09/2012 0550   CO2 26 11/09/2012 0550   GLUCOSE 133* 11/09/2012 0550   BUN 24* 11/09/2012 0550   CREATININE 0.87 11/09/2012 0550   CALCIUM 8.8 11/09/2012 0550   GFRNONAA 56* 11/09/2012 0550   GFRAA 65* 11/09/2012 0550   CBC    Component Value Date/Time   WBC 8.1 11/09/2012 0550   RBC 3.44* 11/09/2012 0550   HGB 10.0* 11/09/2012 0550   HCT 30.6* 11/09/2012 0550   PLT 156 11/09/2012 0550   MCV 89.0 11/09/2012 0550   MCH 29.1 11/09/2012 0550   MCHC 32.7 11/09/2012 0550   RDW 13.8 11/09/2012 0550   LYMPHSABS 1.3 11/08/2012 1851   MONOABS 1.2* 11/08/2012 1851   EOSABS 0.0 11/08/2012 1851   BASOSABS 0.0 11/08/2012 1851   CARDIAC ENZYMES Lab Results  Component Value Date   CKTOTAL 1920* 11/08/2012   TROPONINI <0.30 11/09/2012    Scheduled Meds: . amLODipine  5 mg Oral Daily   And  . irbesartan  150 mg Oral Daily  . antiseptic oral rinse  15 mL Mouth Rinse BID  . cloNIDine  0.2 mg Oral QHS  . docusate sodium  100 mg Oral BID  . heparin  5,000 Units Subcutaneous Q8H  . metoprolol tartrate  25 mg Oral BID  . multivitamin with minerals  1 tablet Oral Daily  . potassium chloride  20 mEq Oral TID  . vitamin C  500 mg Oral Daily   Continuous Infusions: . dextrose 5 % and 0.45% NaCl 100 mL/hr at 11/09/12 0218   PRN Meds:.alum & mag hydroxide-simeth, HYDROmorphone (DILAUDID) injection, ondansetron (ZOFRAN) IV, ondansetron, oxyCODONE  Assessment/Plan: Displaced, closed left hip intertrochanteric fracture  Dehydration  Hypertension  CAD Hypokalemia  Orthopedic consult. [facility] at [phone] notified by me. K+ replacement. EKG. Echo for LV function   LOS: 1 day    Orpah Cobb  MD  11/09/2012, 8:17 AM

## 2012-11-09 NOTE — Anesthesia Procedure Notes (Signed)
Spinal  Start time: 11/09/2012 4:05 PM End time: 11/09/2012 4:14 PM Staffing Performed by: anesthesiologist  Preanesthetic Checklist Completed: patient identified, site marked, surgical consent, pre-op evaluation, timeout performed, IV checked, risks and benefits discussed and monitors and equipment checked Spinal Block Patient position: left lateral decubitus Prep: Betadine Patient monitoring: heart rate, continuous pulse ox and blood pressure Approach: midline Location: L3-4 Injection technique: single-shot Needle Needle gauge: 22 G Additional Notes 1605-1614 SAB FBP, sterile tech IV sedation, turn to LLD position #22 spinal needle midline L3-4 after Betadine prep with clear CSF No paresthesias single pass Marcaine .75% w/out epi  .7cc Difficult pt to assess level but surgically adequate, good respirations No compl Dr Gypsy Balsam

## 2012-11-09 NOTE — ED Notes (Signed)
Phlebotomy at bedside.

## 2012-11-09 NOTE — Progress Notes (Signed)
Pt's daughter has arrived and reports she is power of attorney for pt. Pt and pt's daughter are going over the consent for surgery together and will sign.

## 2012-11-09 NOTE — Transfer of Care (Signed)
Immediate Anesthesia Transfer of Care Note  Patient: Danielle [name]  Procedure(s) Performed: Procedure(s): INTRAMEDULLARY (IM) NAIL FEMORAL/Left (Left)  Patient Location: PACU  Anesthesia Type:MAC and Spinal  Level of Consciousness: awake, alert  and oriented  Airway & Oxygen Therapy: Patient Spontanous Breathing  Post-op Assessment: Report given to PACU RN and Post -op Vital signs reviewed and stable  Post vital signs: Reviewed and stable  Complications: No apparent anesthesia complications

## 2012-11-09 NOTE — Consult Note (Signed)
Reason for Consult:  Left hip fracture Referring Physician: Dr. Edman Circle Danielle [name] is an 79 y.o. female.  HPI: 79 y/o female with PMH of CAD fell earlier today at home.  She lives alone and the fall was not witnessed.  She denies LOC.  She denies any recent illnesses, f/c/n/v/wtl oss.  She c/o severe pain in the left hip that is sharp and worse with motion.  Pain is less when lying still.  Denies numbness, tingling or weakness in the left lower extremity.  No blood thinners.  Last ate yesterday.  Past Medical History  Diagnosis Date  . Coronary artery disease   . CHF (congestive heart failure)   . Hypertension     History reviewed. No pertinent past surgical history.  History reviewed. No pertinent family history.  Social History:  reports that she has never smoked. She does not have any smokeless tobacco history on file. She reports that she does not drink alcohol. Her drug history is not on file.  Allergies:  Allergies  Allergen Reactions  . Keflex (Cephalexin) Itching  . Penicillins Itching    Medications: I have reviewed the patient's current medications.  Results for orders placed during the hospital encounter of 11/08/12 (from the past 48 hour(s))  CBC WITH DIFFERENTIAL     Status: Abnormal   Collection Time    11/08/12  6:51 PM      Result Value Range   WBC 12.4 (*) 4.0 - 10.5 K/uL   RBC 3.50 (*) 3.87 - 5.11 MIL/uL   Hemoglobin 11.0 (*) 12.0 - 15.0 g/dL   HCT 40.9 (*) 81.1 - 91.4 %   MCV 88.6  78.0 - 100.0 fL   MCH 31.4  26.0 - 34.0 pg   MCHC 35.5  30.0 - 36.0 g/dL   RDW 78.2  95.6 - 21.3 %   Platelets 195  150 - 400 K/uL   Neutrophils Relative % 80 (*) 43 - 77 %   Neutro Abs 9.9 (*) 1.7 - 7.7 K/uL   Lymphocytes Relative 11 (*) 12 - 46 %   Lymphs Abs 1.3  0.7 - 4.0 K/uL   Monocytes Relative 10  3 - 12 %   Monocytes Absolute 1.2 (*) 0.1 - 1.0 K/uL   Eosinophils Relative 0  0 - 5 %   Eosinophils Absolute 0.0  0.0 - 0.7 K/uL   Basophils Relative 0  0 - 1 %    Basophils Absolute 0.0  0.0 - 0.1 K/uL  CK     Status: Abnormal   Collection Time    11/08/12  6:51 PM      Result Value Range   Total CK 1920 (*) 7 - 177 U/L  COMPREHENSIVE METABOLIC PANEL     Status: Abnormal   Collection Time    11/08/12  6:51 PM      Result Value Range   Sodium 139  135 - 145 mEq/L   Potassium 3.7  3.5 - 5.1 mEq/L   Chloride 99  96 - 112 mEq/L   CO2 28  19 - 32 mEq/L   Glucose, Bld 120 (*) 70 - 99 mg/dL   BUN 19  6 - 23 mg/dL   Creatinine, Ser 0.86  0.50 - 1.10 mg/dL   Calcium 9.6  8.4 - 57.8 mg/dL   Total Protein 7.7  6.0 - 8.3 g/dL   Albumin 3.5  3.5 - 5.2 g/dL   AST 58 (*) 0 - 37 U/L   ALT  16  0 - 35 U/L   Alkaline Phosphatase 85  39 - 117 U/L   Total Bilirubin 0.7  0.3 - 1.2 mg/dL   GFR calc non Af Amer 71 (*) >90 mL/min   GFR calc Af Amer 82 (*) >90 mL/min   Comment:            The eGFR has been calculated     using the CKD EPI equation.     This calculation has not been     validated in all clinical     situations.     eGFR's persistently     <90 mL/min signify     possible Chronic Kidney Disease.  URINALYSIS, ROUTINE W REFLEX MICROSCOPIC     Status: Abnormal   Collection Time    11/08/12  8:41 PM      Result Value Range   Color, Urine AMBER (*) YELLOW   Comment: BIOCHEMICALS MAY BE AFFECTED BY COLOR   APPearance CLOUDY (*) CLEAR   Specific Gravity, Urine 1.019  1.005 - 1.030   pH 5.5  5.0 - 8.0   Glucose, UA NEGATIVE  NEGATIVE mg/dL   Hgb urine dipstick LARGE (*) NEGATIVE   Bilirubin Urine SMALL (*) NEGATIVE   Ketones, ur 15 (*) NEGATIVE mg/dL   Protein, ur 213 (*) NEGATIVE mg/dL   Urobilinogen, UA 1.0  0.0 - 1.0 mg/dL   Nitrite NEGATIVE  NEGATIVE   Leukocytes, UA NEGATIVE  NEGATIVE  URINE MICROSCOPIC-ADD ON     Status: None   Collection Time    11/08/12  8:41 PM      Result Value Range   Squamous Epithelial / LPF RARE  RARE   WBC, UA 0-2  <3 WBC/hpf   RBC / HPF 7-10  <3 RBC/hpf   Bacteria, UA RARE  RARE   Urine-Other MUCOUS  PRESENT    POCT I-STAT TROPONIN I     Status: Abnormal   Collection Time    11/08/12 10:45 PM      Result Value Range   Troponin i, poc 0.10 (*) 0.00 - 0.08 ng/mL   Comment NOTIFIED PHYSICIAN     Comment 3            Comment: Due to the release kinetics of cTnI,     a negative result within the first hours     of the onset of symptoms does not rule out     myocardial infarction with certainty.     If myocardial infarction is still suspected,     repeat the test at appropriate intervals.  TROPONIN I     Status: None   Collection Time    11/09/12 12:20 AM      Result Value Range   Troponin I <0.30  <0.30 ng/mL   Comment:            Due to the release kinetics of cTnI,     a negative result within the first hours     of the onset of symptoms does not rule out     myocardial infarction with certainty.     If myocardial infarction is still suspected,     repeat the test at appropriate intervals.  BASIC METABOLIC PANEL     Status: Abnormal   Collection Time    11/09/12  5:50 AM      Result Value Range   Sodium 137  135 - 145 mEq/L   Potassium 3.1 (*) 3.5 - 5.1 mEq/L  Chloride 101  96 - 112 mEq/L   CO2 26  19 - 32 mEq/L   Glucose, Bld 133 (*) 70 - 99 mg/dL   BUN 24 (*) 6 - 23 mg/dL   Creatinine, Ser 0.45  0.50 - 1.10 mg/dL   Calcium 8.8  8.4 - 40.9 mg/dL   GFR calc non Af Amer 56 (*) >90 mL/min   GFR calc Af Amer 65 (*) >90 mL/min   Comment:            The eGFR has been calculated     using the CKD EPI equation.     This calculation has not been     validated in all clinical     situations.     eGFR's persistently     <90 mL/min signify     possible Chronic Kidney Disease.  CBC     Status: Abnormal   Collection Time    11/09/12  5:50 AM      Result Value Range   WBC 8.1  4.0 - 10.5 K/uL   RBC 3.44 (*) 3.87 - 5.11 MIL/uL   Hemoglobin 10.0 (*) 12.0 - 15.0 g/dL   HCT 81.1 (*) 91.4 - 78.2 %   MCV 89.0  78.0 - 100.0 fL   MCH 29.1  26.0 - 34.0 pg   MCHC 32.7  30.0 -  36.0 g/dL   RDW 95.6  21.3 - 08.6 %   Platelets 156  150 - 400 K/uL  PROTIME-INR     Status: None   Collection Time    11/09/12  5:50 AM      Result Value Range   Prothrombin Time 14.4  11.6 - 15.2 seconds   INR 1.14  0.00 - 1.49    Dg Chest 1 View  11/08/2012   *RADIOLOGY REPORT*  Clinical Data: Fall, left hip/right shoulder pain  CHEST - 1 VIEW  Comparison: 09/18/2011.  Findings: Lungs are clear. No pleural effusion or pneumothorax.  Cardiomegaly.  Thoracic dextroscoliosis.  IMPRESSION: No evidence of acute cardiopulmonary disease.   Original Report Authenticated By: Charline Bills, M.D.   Dg Shoulder Right  11/08/2012   *RADIOLOGY REPORT*  Clinical Data: Right shoulder pain.  RIGHT SHOULDER - 2+ VIEW  Comparison: None  Findings: The joint spaces are maintained.  No acute fracture.  The right lung is clear.  IMPRESSION: No fracture or dislocation.   Original Report Authenticated By: Rudie Meyer, M.D.   Dg Hip Complete Left  11/08/2012   *RADIOLOGY REPORT*  Clinical Data: Larey Seat.  Left hip pain.  LEFT HIP - COMPLETE 2+ VIEW  Comparison: None  Findings: There is a displaced intertrochanteric fracture of the left hip.  The pubic symphysis and SI joints are intact.  No definite pelvic fractures. Calcified uterine fibroids are noted.  IMPRESSION: Displaced intertrochanteric fracture.   Original Report Authenticated By: Rudie Meyer, M.D.   Dg Hip Complete Right  11/08/2012   *RADIOLOGY REPORT*  Clinical Data: Status post fall.  Right hip and upper leg pain.  RIGHT HIP - COMPLETE 2+ VIEW  Comparison: None.  Findings: No acute bony or joint abnormality is identified. Calcified uterine fibroid and atherosclerotic vascular disease are noted.  IMPRESSION: No acute finding.   Original Report Authenticated By: Holley Dexter, M.D.   Dg Knee 1-2 Views Right  11/08/2012   *RADIOLOGY REPORT*  Clinical Data: Status post fall.  Right leg pain.  RIGHT KNEE - 1-2 VIEW  Comparison: None.  Findings: No acute bony  or joint abnormality is identified.  No knee joint effusion.  No notable degenerative change about the knee.  Extensive atherosclerosis is noted.  IMPRESSION: No acute finding.   Original Report Authenticated By: Holley Dexter, M.D.   Ct Head Wo Contrast  11/08/2012   *RADIOLOGY REPORT*  Clinical Data:  Recent traumatic injury with pain  CT HEAD WITHOUT CONTRAST CT MAXILLOFACIAL WITHOUT CONTRAST CT CERVICAL SPINE WITHOUT CONTRAST  Technique:  Multidetector CT imaging of the head, cervical spine, and maxillofacial structures were performed using the standard protocol without intravenous contrast. Multiplanar CT image reconstructions of the cervical spine and maxillofacial structures were also generated.  Comparison:  11/11/2008  CT HEAD  Findings: The bony calvarium is intact.  Atrophic changes and chronic white matter ischemic change is seen and stable from prior exam. The findings to suggest acute hemorrhage, acute infarction or space-occupying mass lesion are noted.  IMPRESSION: Chronic changes without acute abnormality.  CT MAXILLOFACIAL  Findings:  The bony structures are within normal limits.  No acute fracture is seen.  The surrounding soft tissues demonstrate the thyroid gland to the prominent no other soft tissue abnormality is seen.  No lymphadenopathy is noted.  IMPRESSION: No acute bony abnormality noted.  Findings suggestive of a thyroid goiter.  CT CERVICAL SPINE  Findings:   Seven cervical segments are well visualized.  Vertebral body height is well-maintained.  Multilevel disc space narrowing and facet hypertrophic changes are seen.  No acute fracture is noted.  No acute facet abnormality is seen.  The surrounding soft tissues demonstrate bilateral carotid calcifications.  There is again enlargement of the thyroid gland most consistent with a goiter.  The visualized lung apices are unremarkable.  IMPRESSION: Degenerative change without acute abnormality.   Original Report Authenticated By: Alcide Clever, M.D.   Ct Cervical Spine Wo Contrast  11/08/2012   *RADIOLOGY REPORT*  Clinical Data:  Recent traumatic injury with pain  CT HEAD WITHOUT CONTRAST CT MAXILLOFACIAL WITHOUT CONTRAST CT CERVICAL SPINE WITHOUT CONTRAST  Technique:  Multidetector CT imaging of the head, cervical spine, and maxillofacial structures were performed using the standard protocol without intravenous contrast. Multiplanar CT image reconstructions of the cervical spine and maxillofacial structures were also generated.  Comparison:  11/11/2008  CT HEAD  Findings: The bony calvarium is intact.  Atrophic changes and chronic white matter ischemic change is seen and stable from prior exam. The findings to suggest acute hemorrhage, acute infarction or space-occupying mass lesion are noted.  IMPRESSION: Chronic changes without acute abnormality.  CT MAXILLOFACIAL  Findings:  The bony structures are within normal limits.  No acute fracture is seen.  The surrounding soft tissues demonstrate the thyroid gland to the prominent no other soft tissue abnormality is seen.  No lymphadenopathy is noted.  IMPRESSION: No acute bony abnormality noted.  Findings suggestive of a thyroid goiter.  CT CERVICAL SPINE  Findings:   Seven cervical segments are well visualized.  Vertebral body height is well-maintained.  Multilevel disc space narrowing and facet hypertrophic changes are seen.  No acute fracture is noted.  No acute facet abnormality is seen.  The surrounding soft tissues demonstrate bilateral carotid calcifications.  There is again enlargement of the thyroid gland most consistent with a goiter.  The visualized lung apices are unremarkable.  IMPRESSION: Degenerative change without acute abnormality.   Original Report Authenticated By: Alcide Clever, M.D.   Ct Maxillofacial Wo Cm  11/08/2012   *RADIOLOGY REPORT*  Clinical Data:  Recent traumatic injury with pain  CT HEAD WITHOUT CONTRAST CT MAXILLOFACIAL WITHOUT CONTRAST CT CERVICAL SPINE WITHOUT  CONTRAST  Technique:  Multidetector CT imaging of the head, cervical spine, and maxillofacial structures were performed using the standard protocol without intravenous contrast. Multiplanar CT image reconstructions of the cervical spine and maxillofacial structures were also generated.  Comparison:  11/11/2008  CT HEAD  Findings: The bony calvarium is intact.  Atrophic changes and chronic white matter ischemic change is seen and stable from prior exam. The findings to suggest acute hemorrhage, acute infarction or space-occupying mass lesion are noted.  IMPRESSION: Chronic changes without acute abnormality.  CT MAXILLOFACIAL  Findings:  The bony structures are within normal limits.  No acute fracture is seen.  The surrounding soft tissues demonstrate the thyroid gland to the prominent no other soft tissue abnormality is seen.  No lymphadenopathy is noted.  IMPRESSION: No acute bony abnormality noted.  Findings suggestive of a thyroid goiter.  CT CERVICAL SPINE  Findings:   Seven cervical segments are well visualized.  Vertebral body height is well-maintained.  Multilevel disc space narrowing and facet hypertrophic changes are seen.  No acute fracture is noted.  No acute facet abnormality is seen.  The surrounding soft tissues demonstrate bilateral carotid calcifications.  There is again enlargement of the thyroid gland most consistent with a goiter.  The visualized lung apices are unremarkable.  IMPRESSION: Degenerative change without acute abnormality.   Original Report Authenticated By: Alcide Clever, M.D.    ROS:  As above PE:  Blood pressure 146/52, pulse 70, temperature 98.4 F (36.9 C), temperature source Oral, resp. rate 16, height 5\' 2"  (1.575 m), weight 57.244 kg (126 lb 3.2 oz), SpO2 96.00%. Thin elderly woman in nad.  A and O.  Mood normal.  EOMI.  resp unlabored.  L hip with healthy skin.  5/5 strength in PF and Df of the toes.  Sens to LT intact at the foot. No lymphadenopathy.  Sens to LT intact at  the foot  Assessment/Plan: L hip intertrochanteric fracture - to OR for closed reduction and IM nail.  Pt is cleared for surgery by Dr. Algie Coffer.  The risks and benefits of the alternative treatment options have been discussed in detail.  The patient wishes to proceed with surgery and specifically understands risks of bleeding, infection, nerve damage, blood clots, need for additional surgery, amputation and death.   Toni Arthurs November 22, 2012, 3:53 PM

## 2012-11-09 NOTE — Progress Notes (Signed)
Dentures removed in Short Stay and taken to daughter in waiting room.

## 2012-11-09 NOTE — Brief Op Note (Signed)
11/08/2012 - 11/09/2012  5:31 PM  PATIENT:  Danielle [name]  79 y.o. female  PRE-OPERATIVE DIAGNOSIS:  Left Hip inter troch Fracture  POST-OPERATIVE DIAGNOSIS:  same  Procedure(s): Intramedullary nailing of the left hip inter-trochanteric fracture  SURGEON:  Toni Arthurs, MD  ASSISTANT: n/a  ANESTHESIA:   spinal  EBL:  50 cc  TOURNIQUET:  n/a  COMPLICATIONS:  None apparent  DISPOSITION:  Extubated, awake and stable to recovery.  DICTATION ID:  [id number]

## 2012-11-09 NOTE — Anesthesia Preprocedure Evaluation (Signed)
Anesthesia Evaluation  Patient identified by MRN, date of birth, ID band Patient awake    Reviewed: Allergy & Precautions, H&P , NPO status , Patient's Chart, lab work & pertinent test results  Airway Mallampati: I TM Distance: >3 FB Neck ROM: Full    Dental   Pulmonary  breath sounds clear to auscultation        Cardiovascular hypertension, + CAD and +CHF Rhythm:Regular Rate:Normal     Neuro/Psych    GI/Hepatic   Endo/Other    Renal/GU      Musculoskeletal   Abdominal   Peds  Hematology   Anesthesia Other Findings anemic  Reproductive/Obstetrics                           Anesthesia Physical Anesthesia Plan  ASA: III  Anesthesia Plan: Spinal   Post-op Pain Management:    Induction:   Airway Management Planned: Simple Face Mask  Additional Equipment:   Intra-op Plan:   Post-operative Plan:   Informed Consent: I have reviewed the patients History and Physical, chart, labs and discussed the procedure including the risks, benefits and alternatives for the proposed anesthesia with the patient or authorized representative who has indicated his/her understanding and acceptance.     Plan Discussed with: CRNA and Surgeon  Anesthesia Plan Comments:         Anesthesia Quick Evaluation

## 2012-11-09 NOTE — ED Notes (Signed)
Troponin resulted and is WNL. Permission to bring pt to the floor granted. Floor called and informed.

## 2012-11-10 ENCOUNTER — Encounter (HOSPITAL_COMMUNITY): Payer: Self-pay | Admitting: Orthopedic Surgery

## 2012-11-10 LAB — BASIC METABOLIC PANEL
BUN: 32 mg/dL — ABNORMAL HIGH (ref 6–23)
Calcium: 8.3 mg/dL — ABNORMAL LOW (ref 8.4–10.5)
Creatinine, Ser: 1.17 mg/dL — ABNORMAL HIGH (ref 0.50–1.10)
GFR calc Af Amer: 45 mL/min — ABNORMAL LOW (ref >90)
GFR calc non Af Amer: 39 mL/min — ABNORMAL LOW (ref >90)

## 2012-11-10 LAB — CBC
HCT: 21.9 % — ABNORMAL LOW (ref 36.0–46.0)
MCHC: 36.1 g/dL — ABNORMAL HIGH (ref 30.0–36.0)
MCV: 90.1 fL (ref 78.0–100.0)
RDW: 13.7 % (ref 11.5–15.5)

## 2012-11-10 LAB — URINE CULTURE

## 2012-11-10 MED ORDER — HYDROCODONE-ACETAMINOPHEN 5-325 MG PO TABS: 1.0000 | ORAL_TABLET | Freq: Four times a day (QID) | ORAL | Status: DC | PRN

## 2012-11-10 MED ORDER — ENOXAPARIN SODIUM 30 MG/0.3ML ~~LOC~~ SOLN: 30.0000 mg | SUBCUTANEOUS | Status: DC

## 2012-11-10 NOTE — Progress Notes (Addendum)
Per report, Patient due to void post foley removal. Toileting offered to patient. She said she had voided earlier this afternoon  and does not feel like voiding right now. Will continue to monitor. KYoung RN  12:30am Patient incontinent x1. Bladder scan showed 123cc. Will continue to monitor. KYoung RN

## 2012-11-10 NOTE — Plan of Care (Addendum)
Problem: Phase II Progression Outcomes Goal: Discharge plan established Recommend SNF for further rehab needs after acute care d/c

## 2012-11-10 NOTE — Care Management Note (Signed)
  Page 1 of 1   11/10/2012     12:11:36 PM   CARE MANAGEMENT NOTE 11/10/2012  Patient:  NAREH, MATZKE   Account Number:  [id number]  Date Initiated:  11/10/2012  Documentation initiated by:  Ronny Flurry  Subjective/Objective Assessment:     Action/Plan:   Anticipated DC Date:     Anticipated DC Plan:  SKILLED NURSING FACILITY  In-house referral  Clinical Social Worker         Choice offered to / List presented to:             Status of service:  Completed, signed off Medicare Important Message given?   (If response is "NO", the following Medicare IM given date fields will be blank) Date Medicare IM given:   Date Additional Medicare IM given:    Discharge Disposition:    Per UR Regulation:  Reviewed for med. necessity/level of care/duration of stay  If discussed at Long Length of Stay Meetings, dates discussed:    Comments:

## 2012-11-10 NOTE — Evaluation (Signed)
Occupational Therapy Evaluation Patient Details Name: Danielle [name] MRN: [id number] DOB: [birth year] Today's Date: 11/10/2012 Time:  -     OT Assessment / Plan / Recommendation History of present illness 79 y/o female, fell at home and sustained L hip fx now s/p IM nail and WBAT.    Clinical Impression   Pt demos decline in function with ADLs and ADL mobility following IM nail surgery for L hip due to falling at home resulting in L hip fx. Pt would benefit from acute OT services to address impairments to increase level of function and safety    OT Assessment  Patient needs continued OT Services    Follow Up Recommendations  SNF    Barriers to Discharge Decreased caregiver support Pt lives alone in her apt, dtr lives across the hall per pt  Equipment Recommendations  None recommended by OT;Other (comment) (TBD)    Recommendations for Other Services    Frequency  Min 2X/week    Precautions / Restrictions Precautions Precautions: Fall Restrictions Weight Bearing Restrictions: Yes LLE Weight Bearing: Weight bearing as tolerated   Pertinent Vitals/Pain 8/10 L hip L LE, meds given prior to session    ADL  Grooming: Performed;Wash/dry face;Wash/dry hands;Moderate assistance Where Assessed - Grooming: Supported sitting Upper Body Bathing: +1 Total assistance Lower Body Bathing: +1 Total assistance Upper Body Dressing: +1 Total assistance Lower Body Dressing: +1 Total assistance Tub/Shower Transfer Method: Not assessed Equipment Used: Gait belt;Other (comment) (Stedy lift) Transfers/Ambulation Related to ADLs: total A with ADL mobility ADL Comments: toatl A with all ADLs at this time    OT Diagnosis: Generalized weakness;Acute pain  OT Problem List: Decreased strength;Decreased knowledge of use of DME or AE;Decreased activity tolerance;Impaired balance (sitting and/or standing);Pain OT Treatment Interventions: Self-care/ADL training;Therapeutic exercise;Patient/family  education;Neuromuscular education;Balance training;Therapeutic activities;DME and/or AE instruction   OT Goals(Current goals can be found in the care plan section) Acute Rehab OT Goals Patient Stated Goal: pain OT Goal Formulation: With patient Time For Goal Achievement: 11/17/12 Potential to Achieve Goals: Fair ADL Goals Pt Will Perform Grooming: with min assist;sitting Pt Will Perform Upper Body Bathing: with max assist;with mod assist;sitting Pt Will Perform Upper Body Dressing: with mod assist;with max assist;sitting Pt Will Transfer to Toilet: with total assist;with +2 assist;bedside commode  Visit Information  Last OT Received On: 11/10/12 Assistance Needed: +3 or more PT/OT Co-Evaluation/Treatment: Yes History of Present Illness: 79 y/o female, fell at home and sustained L hip fx now s/p IM nail and WBAT.        Prior Functioning     Home Living Family/patient expects to be discharged to:: Private residence Living Arrangements: Alone;Other (Comment) (dtr lives across the hall) Available Help at Discharge: Family Type of Home: Apartment Home Access: Level entry Home Layout: One level Home Equipment: Environmental consultant - 4 wheels;Bedside commode;Tub bench;Grab bars - tub/shower;Grab bars - toilet Prior Function Comments: used rollator at home Communication Communication: No difficulties Dominant Hand: Right         Vision/Perception Vision - History Baseline Vision: Wears glasses all the time Patient Visual Report: No change from baseline Perception Perception: Within Functional Limits   Cognition  Cognition Arousal/Alertness: Lethargic Behavior During Therapy: Anxious Overall Cognitive Status: No family/caregiver present to determine baseline cognitive functioning Area of Impairment: Attention;Following commands;Awareness;Problem solving Current Attention Level: Sustained Following Commands: Follows one step commands inconsistently Awareness: Intellectual Problem  Solving: Decreased initiation;Difficulty sequencing;Requires verbal cues;Requires tactile cues General Comments: pt very anxious, slow to respond, resistant at times  Extremity/Trunk Assessment Upper Extremity Assessment Upper Extremity Assessment: Generalized weakness     Mobility Bed Mobility Bed Mobility: Supine to Sit Supine to Sit: 1: +2 Total assist Supine to Sit: Patient Percentage: 0% Details for Bed Mobility Assistance: totalA using pad to sit her up, pt very resistant because of pain Transfers Sit to Stand: 1: +2 Total assist;From elevated surface Sit to Stand: Patient Percentage: 30% Stand to Sit: 1: +2 Total assist Stand to Sit: Patient Percentage: 30% Transfer via Lift Equipment: Stedy Details for Transfer Assistance: stood x2 with stedy lift, needed bed elevated, assist to flex trunk over her feet and max facilitation for anterior translation of trunk over BOS as well as extension through trunk     Exercise     Balance Balance Balance Assessed: Yes Static Sitting Balance Static Sitting - Level of Assistance: 2: Max assist Static Sitting - Comment/# of Minutes: maxA to place feet on the floor, varying levels of assist, initially needing maxA as pt extending and pushing posteriorly away from the pain, able to sit for brief periods of minA   End of Session OT - End of Session Equipment Utilized During Treatment: Gait belt;Other (comment) (Stedy lift) Activity Tolerance: Patient limited by pain Patient left: in chair;with call bell/phone within reach;with chair alarm set Nurse Communication: Mobility status;Need for lift equipment  GO     Galen Manila 11/10/2012, 11:27 AM

## 2012-11-10 NOTE — Evaluation (Signed)
Physical Therapy Evaluation Patient Details Name: Danielle [name] MRN: [id number] DOB: 05/16/1916 Today's Date: 11/10/2012 Time: 1025-1057 PT Time Calculation (min): 32 min  PT Assessment / Plan / Recommendation History of Present Illness  79 y/o female, fell at home and sustained L hip fx now s/p IM nail and WBAT.   Clinical Impression  Presents to PT with below listed impairments (see PT problem list) impacting independence functional mobility. Will benefit physical therapy in the acute setting to maximize safety for d/c to next venue.      PT Assessment  Patient needs continued PT services    Follow Up Recommendations  SNF    Does the patient have the potential to tolerate intense rehabilitation      Barriers to Discharge Decreased caregiver support      Equipment Recommendations  None recommended by PT    Recommendations for Other Services     Frequency Min 3X/week    Precautions / Restrictions Precautions Precautions: Fall Restrictions Weight Bearing Restrictions: Yes LLE Weight Bearing: Weight bearing as tolerated   Pertinent Vitals/Pain Screaming in pain whenever she is touched, even barely touching her feet she screams because it "tickles," RN had just given her morphine prior to our session, utilized calming and breathing techniques to reduce her anxiety     Mobility  Bed Mobility Bed Mobility: Supine to Sit Supine to Sit: 1: +2 Total assist Supine to Sit: Patient Percentage: 0% Details for Bed Mobility Assistance: totalA using pad to sit her up, pt very resistant because of pain Transfers Transfers: Sit to Stand;Stand to Sit Sit to Stand: 1: +2 Total assist;From elevated surface Sit to Stand: Patient Percentage: 30% Stand to Sit: 1: +2 Total assist Stand to Sit: Patient Percentage: 30% Transfer via Lift Equipment: Stedy Details for Transfer Assistance: stood x2 with stedy lift, needed bed elevated, assist to flex trunk over her feet and max facilitation for  anterior translation of trunk over BOS as well as extension through trunk Ambulation/Gait Ambulation/Gait Assistance: Not tested (comment) Stairs: No    Exercises     PT Diagnosis: Abnormality of gait;Difficulty walking;Generalized weakness;Acute pain  PT Problem List: Decreased strength;Decreased range of motion;Decreased activity tolerance;Decreased balance;Decreased mobility;Decreased knowledge of use of DME;Decreased safety awareness;Pain PT Treatment Interventions: DME instruction;Functional mobility training;Therapeutic activities;Therapeutic exercise;Balance training;Neuromuscular re-education;Patient/family education;Gait training     PT Goals(Current goals can be found in the care plan section) Acute Rehab PT Goals Patient Stated Goal: pain PT Goal Formulation: With patient Time For Goal Achievement: 11/17/12 Potential to Achieve Goals: Fair  Visit Information  Last PT Received On: 11/10/12 Assistance Needed: +3 or more History of Present Illness: 79 y/o female, fell at home and sustained L hip fx now s/p IM nail and WBAT.        Prior Functioning  Home Living Family/patient expects to be discharged to:: Private residence Living Arrangements: Alone;Other (Comment) (dtr lives across the hall) Available Help at Discharge: Family Type of Home: Apartment Home Access: Level entry Home Layout: One level Home Equipment: Environmental consultant - 4 wheels;Bedside commode;Tub bench;Grab bars - tub/shower;Grab bars - toilet Prior Function Comments: used rollator at home Communication Communication: No difficulties Dominant Hand: Right    Cognition  Cognition Arousal/Alertness: Lethargic Behavior During Therapy: Anxious Overall Cognitive Status: No family/caregiver present to determine baseline cognitive functioning Area of Impairment: Attention;Following commands;Awareness;Problem solving Current Attention Level: Sustained Following Commands: Follows one step commands  inconsistently Awareness: Intellectual Problem Solving: Decreased initiation;Difficulty sequencing;Requires verbal cues;Requires tactile cues General Comments: pt  very anxious, slow to respond, resistant at times    Extremity/Trunk Assessment Upper Extremity Assessment Upper Extremity Assessment: Generalized weakness Lower Extremity Assessment Lower Extremity Assessment: Difficult to assess due to impaired cognition;RLE deficits/detail;LLE deficits/detail RLE Deficits / Details: pt very resistant to any ROM, strong knee extension RLE: Unable to fully assess due to pain LLE Deficits / Details: pt very resistant to any ROM, strong knee extension LLE: Unable to fully assess due to pain   Balance Balance Balance Assessed: Yes Static Sitting Balance Static Sitting - Level of Assistance: 2: Max assist Static Sitting - Comment/# of Minutes: maxA to place feet on the floor, varying levels of assist, initially needing maxA as pt extending and pushing posteriorly away from the pain, able to sit for brief periods of minA  End of Session PT - End of Session Equipment Utilized During Treatment: Gait belt Activity Tolerance: Patient limited by pain Patient left: in chair;with call bell/phone within reach;with chair alarm set Nurse Communication: Mobility status;Need for lift equipment  GP     Danielle [name] H 11/10/2012, 11:21 AM

## 2012-11-10 NOTE — Clinical Social Work Placement (Signed)
Clinical Social Work Department CLINICAL SOCIAL WORK PLACEMENT NOTE 11/10/2012  Patient:  Danielle [name], Danielle [name]  Account Number:  [id number] Admit date:  11/08/2012  Clinical Social Worker:  Hulan Fray  Date/time:  11/10/2012 10:42 AM  Clinical Social Work is seeking post-discharge placement for this patient at the following level of care:   SKILLED NURSING   (*CSW will update this form in Epic as items are completed)   11/10/2012  Patient/family provided with [facility] Department of Clinical Social Work's list of facilities offering this level of care within the geographic area requested by the patient (or if unable, by the patient's family).  11/10/2012  Patient/family informed of their freedom to choose among providers that offer the needed level of care, that participate in Medicare, Medicaid or managed care program needed by the patient, have an available bed and are willing to accept the patient.  11/10/2012  Patient/family informed of [facility]' ownership interest in [facility], as well as of the fact that they are under no obligation to receive care at this facility.  PASARR submitted to EDS on 11/10/2012 PASARR number received from EDS on 11/10/2012  FL2 transmitted to all facilities in geographic area requested by pt/family on  11/10/2012 FL2 transmitted to all facilities within larger geographic area on   Patient informed that his/her managed care company has contracts with or will negotiate with  certain facilities, including the following:     Patient/family informed of bed offers received:   Patient chooses bed at  Physician recommends and patient chooses bed at    Patient to be transferred to  on   Patient to be transferred to facility by   The following physician request were entered in Epic:   Additional Comments:

## 2012-11-10 NOTE — Progress Notes (Signed)
Subjective: 1 Day Post-Op Procedure(s) (LRB): INTRAMEDULLARY (IM) NAIL FEMORAL/Left (Left) Patient reports pain as mild.    Objective: Vital signs in last 24 hours: Temp:  [97.5 F (36.4 C)-98.4 F (36.9 C)] 98.2 F (36.8 C) (08/05 0455) Pulse Rate:  [46-92] 83 (08/05 1007) Resp:  [7-24] 16 (08/05 1116) BP: (97-152)/(46-76) 119/71 mmHg (08/05 1007) SpO2:  [92 %-100 %] 96 % (08/05 1116)  Intake/Output from previous day: 08/04 0701 - 08/05 0700 In: 400 [I.V.:400] Out: 700 [Urine:550; Blood:150] Intake/Output this shift:     Recent Labs  11/08/12 1851 11/09/12 0550 11/09/12 2051 11/10/12 0610  HGB 11.0* 10.0* 9.7* 7.9*    Recent Labs  11/09/12 2051 11/10/12 0610  WBC 10.1 8.1  RBC 3.09* 2.43*  HCT 27.8* 21.9*  PLT PLATELET CLUMPS NOTED ON SMEAR, COUNT APPEARS ADEQUATE 151    Recent Labs  11/09/12 0550 11/09/12 2051 11/10/12 0610  NA 137  --  134*  K 3.1*  --  4.4  CL 101  --  100  CO2 26  --  24  BUN 24*  --  32*  CREATININE 0.87 0.96 1.17*  GLUCOSE 133*  --  111*  CALCIUM 8.8  --  8.3*    Recent Labs  11/09/12 0550  INR 1.14    PE:  wound dressed and dry.  NVI at L LE.  Assessment/Plan: 1 Day Post-Op Procedure(s) (LRB): INTRAMEDULLARY (IM) NAIL FEMORAL/Left (Left) Advance diet  WBAT on L LE.  PT / OT / d/c planning.  Toni Arthurs 11/10/2012, 11:29 AM

## 2012-11-10 NOTE — Op Note (Signed)
NAMETRINIDI, TOPPINS NO.:  [id number]  MEDICAL RECORD NO.:  [id number]  LOCATION:  6N23C                        FACILITY:  [facility]  PHYSICIAN:  Toni Arthurs, MD        DATE OF BIRTH:  06/16/1916  DATE OF PROCEDURE:  11/09/2012 DATE OF DISCHARGE:                              OPERATIVE REPORT   PREOPERATIVE DIAGNOSIS:  Left hip intertrochanteric fracture.  POSTOPERATIVE DIAGNOSIS:  Left hip intertrochanteric fracture.  PROCEDURE:  Intramedullary nailing of the left hip intertrochanteric fracture.  SURGEON:  Toni Arthurs, MD  ANESTHESIA:  Spinal.  ESTIMATED BLOOD LOSS:  50 mL.  COMPLICATIONS:  None apparent.  DISPOSITION:  Extubated awake and stable to recovery.  INDICATIONS FOR PROCEDURE:  The patient is a 79 year old female who has a past medical history significant for coronary artery disease.  She was at home alone this morning when she fell.  She denies any loss of consciousness.  She complains of left hip pain that is sharp and severe, it is worse with movement and better with rest.  X-rays reveal left hip displaced intertrochanteric fracture.  She presents now for operative treatment of this condition.  She understands the risks and benefits, the alternative treatment options and elects surgical treatment.  She specifically understands risks of bleeding, infection, nerve damage, blood clots, need for additional surgery, amputation, and death.  PROCEDURE IN DETAIL:  After preoperative consent was obtained, the correct operative site was identified.  The patient was brought to the operating room supine on a hospital bed.  Spinal anesthesia was administered by Dr.  Gypsy Balsam.  She was then moved onto the operating table and positioned with the right lower extremity in a well leg holder.  The left lower extremity was reduced with traction, external rotation, and abduction followed by internal rotation and adduction.  AP and lateral fluoroscopic images  confirmed appropriate reduction of the fracture. The left lower extremity was then prepped and draped in standard sterile fashion.  A longitudinal incision was made just proximal to the greater trochanter.  Sharp dissection was carried down through the skin and subcutaneous tissue.  The IT band was split in line with its fibers.  A guide pin was introduced into the proximal end of the femur, it was advanced into the femoral canal.  AP and lateral fluoroscopic images confirmed appropriate position of the guide pin.  A starter reamer was then introduced over the guide pin and used to open the femoral canal proximally.  A short Affixus femoral nail was inserted and advanced until it was flushed with the tip of the greater trochanter.  Targeting guide was then used to insert a lag screw from the lateral aspect of the thigh up into the center of the femoral head on both the AP and lateral fluoroscopic images.  A distal interlock screw was then placed again using the targeting guide in a percutaneous fashion.  The fracture site was lag and then the lag screw was engaged to the nail proximally. Final AP and lateral fluoroscopic images confirmed appropriate position and length of all hardware and appropriate reduction in the fracture site.  The wounds were irrigated copiously.  The IT  band was repaired with 0 Vicryl simple sutures.  The subcutaneous tissue was approximated with inverted simple sutures of 3-0 Monocryl and staples were used to close the skin incision.  Sterile dressings were applied.  The patient was then awakened from anesthesia and transported to recovery room in stable condition.  FOLLOWUP PLAN:  The patient will be weightbearing as tolerated on the left lower extremity.  She will have physical therapy starting tomorrow. She will have Lovenox for DVT prophylaxis.     Toni Arthurs, MD     JH/MEDQ  D:  11/09/2012  T:  11/10/2012  Job:  [id number]

## 2012-11-10 NOTE — Progress Notes (Signed)
Subjective:  Feeling better. Post op day 1- Intramedullary nailing of the left hip intertrochanteric fracture. Afebrile.   Objective:  Vital Signs in the last 24 hours: Temp:  [97.5 F (36.4 C)-98.4 F (36.9 C)] 98.2 F (36.8 C) (08/05 0455) Pulse Rate:  [46-92] 83 (08/05 1007) Cardiac Rhythm:  [-] Atrial fibrillation (08/04 2300) Resp:  [7-24] 16 (08/05 1116) BP: (97-152)/(46-76) 119/71 mmHg (08/05 1007) SpO2:  [92 %-100 %] 96 % (08/05 1116)  Physical Exam: BP Readings from Last 1 Encounters:  11/10/12 119/71     Wt Readings from Last 1 Encounters:  11/09/12 57.244 kg (126 lb 3.2 oz)    Weight change:   HEENT: [place]/AT, Eyes-Brown, PERL, EOMI, Conjunctiva-Pale, Sclera-Non-icteric Neck: No JVD, No bruit, Trachea midline. Lungs:  Clear, Bilateral. Cardiac:  Regular rhythm, normal S1 and S2, no S3.  Abdomen:  Soft, non-tender. Extremities:  No edema present. No cyanosis. No clubbing. S/p left hip surgery. CNS: AxOx3, Cranial nerves grossly intact, moves all 4 extremities. Right handed. Skin: Warm and dry.   Intake/Output from previous day: 08/04 0701 - 08/05 0700 In: 400 [I.V.:400] Out: 700 [Urine:550; Blood:150]    Lab Results: BMET    Component Value Date/Time   NA 134* 11/10/2012 0610   K 4.4 11/10/2012 0610   CL 100 11/10/2012 0610   CO2 24 11/10/2012 0610   GLUCOSE 111* 11/10/2012 0610   BUN 32* 11/10/2012 0610   CREATININE 1.17* 11/10/2012 0610   CALCIUM 8.3* 11/10/2012 0610   GFRNONAA 39* 11/10/2012 0610   GFRAA 45* 11/10/2012 0610   CBC    Component Value Date/Time   WBC 8.1 11/10/2012 0610   RBC 2.43* 11/10/2012 0610   HGB 7.9* 11/10/2012 0610   HCT 21.9* 11/10/2012 0610   PLT 151 11/10/2012 0610   MCV 90.1 11/10/2012 0610   MCH 32.5 11/10/2012 0610   MCHC 36.1* 11/10/2012 0610   RDW 13.7 11/10/2012 0610   LYMPHSABS 1.3 11/08/2012 1851   MONOABS 1.2* 11/08/2012 1851   EOSABS 0.0 11/08/2012 1851   BASOSABS 0.0 11/08/2012 1851   CARDIAC ENZYMES Lab Results  Component Value Date   CKTOTAL 1920* 11/08/2012   TROPONINI <0.30 11/09/2012    Scheduled Meds: . amLODipine  5 mg Oral Daily   And  . irbesartan  150 mg Oral Daily  . cloNIDine  0.2 mg Oral QHS  . docusate sodium  100 mg Oral BID  . enoxaparin (LOVENOX) injection  30 mg Subcutaneous Q24H  . metoprolol tartrate  25 mg Oral BID  . multivitamin with minerals  1 tablet Oral Daily  . potassium chloride  20 mEq Oral TID  . senna  2 tablet Oral BID  . vitamin C  500 mg Oral Daily   Continuous Infusions: . sodium chloride 75 mL/hr (11/10/12 0151)   PRN Meds:.acetaminophen, acetaminophen, alum & mag hydroxide-simeth, HYDROcodone-acetaminophen, menthol-cetylpyridinium, morphine injection, ondansetron (ZOFRAN) IV, ondansetron, phenol  Assessment/Plan: left hip intertrochanteric fracture. Intramedullary nailing of the left hip intertrochanteric fracture. Dehydration-improving Hypertension Hypokalemia-resolved CAD  Awaiting SNF and PT/OT.    LOS: 2 days    Orpah Cobb  MD  11/10/2012, 12:53 PM

## 2012-11-10 NOTE — Clinical Social Work Note (Signed)
Clinical Social Work Department BRIEF PSYCHOSOCIAL ASSESSMENT 11/10/2012  Patient:  Danielle [name], Danielle [name]     Account Number:  [id number]     Admit date:  11/08/2012  Clinical Social Worker:  Hulan Fray  Date/Time:  11/10/2012 10:09 AM  Referred by:  Physician  Date Referred:  11/09/2012 Referred for  SNF Placement   Other Referral:   Interview type:  Patient Other interview type:    PSYCHOSOCIAL DATA Living Status:  ALONE Admitted from facility:   Level of care:   Primary support name:  Danielle [name] Primary support relationship to patient:  CHILD, ADULT Degree of support available:   supportive    CURRENT CONCERNS Current Concerns  Post-Acute Placement   Other Concerns:    SOCIAL WORK ASSESSMENT / PLAN Clinical Social Worker received referral for possible short term SNF placement at discharge. CSW introduced self and explained reason for visit. Patient was lying in bed and reported that she was in some pain and speaking to CSW with eyes closed.    Patient reported that she lives in the same apartment complex as her daughter, but is alone in her apartment. Patient reported that she has never been to a SNF previously, but is agreeable for CSW to initate a search in [facility]. Patient was agreeable for CSW to speak with daughter about potential plans as well. CSW provided SNF packet to patient and left in her room.    CSW spoke with daughter and daughter is agreeable for SNF placement because patient leaves her apartment clutters. Daughter reports that patient does not have a working phone and has a life alert button, but keeps in drawer and does not use. Daughter reports that patient always lays on her right side and stays in the bed most of the day. Daughter reports that she is not able to look after patient herself due to her own medical issues.    Both patient and daughter are agreeable for CSW to initate SNF search in [place]. CSW will complete Fl2 for MD's  signature and will update patient and family when offers are made.   Assessment/plan status:  Psychosocial Support/Ongoing Assessment of Needs Other assessment/ plan:   Information/referral to community resources:   SNF packet    PATIENT'S/FAMILY'S RESPONSE TO PLAN OF CARE: Patient and daughter are agreeable for SNF placement at discharge. Patient was appreciative of CSW"s visit and assistance.

## 2012-11-11 LAB — CBC
Hemoglobin: 8.2 g/dL — ABNORMAL LOW (ref 12.0–15.0)
MCH: 31.4 pg (ref 26.0–34.0)
MCHC: 34.6 g/dL (ref 30.0–36.0)
Platelets: 168 10*3/uL (ref 150–400)

## 2012-11-11 LAB — BASIC METABOLIC PANEL
Calcium: 8.4 mg/dL (ref 8.4–10.5)
GFR calc non Af Amer: 41 mL/min — ABNORMAL LOW (ref >90)
Glucose, Bld: 79 mg/dL (ref 70–99)
Sodium: 136 mEq/L (ref 135–145)

## 2012-11-11 NOTE — Progress Notes (Signed)
Subjective:  Awake. Tenderness on touching left foot or hip area.  Objective:  Vital Signs in the last 24 hours: Temp:  [98.6 F (37 C)] 98.6 F (37 C) (08/06 0618) Pulse Rate:  [68-71] 68 (08/06 0618) Cardiac Rhythm:  [-] Normal sinus rhythm (08/05 2115) Resp:  [16-18] 18 (08/06 0800) BP: (110-144)/(56-62) 144/56 mmHg (08/06 0618) SpO2:  [96 %-98 %] 98 % (08/06 0800)  Physical Exam: BP Readings from Last 1 Encounters:  11/11/12 144/56     Wt Readings from Last 1 Encounters:  11/09/12 57.244 kg (126 lb 3.2 oz)    Weight change:   HEENT: [place]/AT, Eyes-Brown, PERL, EOMI, Conjunctiva-Pale, Sclera-Non-icteric Neck: No JVD, No bruit, Trachea midline. Lungs:  Clear, Bilateral. Cardiac:  Regular rhythm, normal S1 and S2, no S3.  Abdomen:  Soft, non-tender. Extremities:  No edema present. No cyanosis. No clubbing. S/P left hip surgery with dressing. CNS: AxOx3, Cranial nerves grossly intact, moves all 4 extremities. Right handed. Skin: Warm and dry.   Intake/Output from previous day: 08/05 0701 - 08/06 0700 In: 1212.3 [I.V.:1211.3] Out: 100 [Urine:100]    Lab Results: BMET    Component Value Date/Time   NA 136 11/11/2012 0652   K 4.4 11/11/2012 0652   CL 105 11/11/2012 0652   CO2 23 11/11/2012 0652   GLUCOSE 79 11/11/2012 0652   BUN 38* 11/11/2012 0652   CREATININE 1.11* 11/11/2012 0652   CALCIUM 8.4 11/11/2012 0652   GFRNONAA 41* 11/11/2012 0652   GFRAA 48* 11/11/2012 0652   CBC    Component Value Date/Time   WBC 9.1 11/11/2012 0652   RBC 2.61* 11/11/2012 0652   HGB 8.2* 11/11/2012 0652   HCT 23.7* 11/11/2012 0652   PLT 168 11/11/2012 0652   MCV 90.8 11/11/2012 0652   MCH 31.4 11/11/2012 0652   MCHC 34.6 11/11/2012 0652   RDW 13.7 11/11/2012 0652   LYMPHSABS 1.3 11/08/2012 1851   MONOABS 1.2* 11/08/2012 1851   EOSABS 0.0 11/08/2012 1851   BASOSABS 0.0 11/08/2012 1851   CARDIAC ENZYMES Lab Results  Component Value Date   CKTOTAL 1920* 11/08/2012   TROPONINI <0.30 11/09/2012    Scheduled Meds: .  amLODipine  5 mg Oral Daily   And  . irbesartan  150 mg Oral Daily  . cloNIDine  0.2 mg Oral QHS  . docusate sodium  100 mg Oral BID  . enoxaparin (LOVENOX) injection  30 mg Subcutaneous Q24H  . metoprolol tartrate  25 mg Oral BID  . multivitamin with minerals  1 tablet Oral Daily  . senna  2 tablet Oral BID  . vitamin C  500 mg Oral Daily   Continuous Infusions: . sodium chloride 75 mL/hr at 11/10/12 1505   PRN Meds:.acetaminophen, acetaminophen, alum & mag hydroxide-simeth, HYDROcodone-acetaminophen, menthol-cetylpyridinium, morphine injection, ondansetron (ZOFRAN) IV, ondansetron, phenol  Assessment/Plan: left hip intertrochanteric fracture.  Intramedullary nailing of the left hip intertrochanteric fracture.  Dehydration-improving  Hypertension  Hypokalemia-resolved  CAD  Awaiting SNF placement.    LOS: 3 days    Orpah Cobb  MD  11/11/2012, 10:47 AM

## 2012-11-11 NOTE — Progress Notes (Signed)
OT Cancellation Note  Patient Details Name: Danielle [name] MRN: [id number] DOB: 1916-12-03   Cancelled Treatment:    Reason Eval/Treat Not Completed: Fatigue/lethargy limiting ability to participate. Pt requested therapy return tomorrow, will re attempt tomorrow  Galen Manila 11/11/2012, 2:22 PM

## 2012-11-11 NOTE — Progress Notes (Signed)
Subjective: 2 Days Post-Op Procedure(s) (LRB): INTRAMEDULLARY (IM) NAIL FEMORAL/Left (Left) Patient reports pain as mild.  No c/o.  Objective: Vital signs in last 24 hours: Temp:  [98.4 F (36.9 C)-98.6 F (37 C)] 98.4 F (36.9 C) (08/06 1326) Pulse Rate:  [68-77] 77 (08/06 1326) Resp:  [16-18] 16 (08/06 1326) BP: (110-144)/(56-62) 128/60 mmHg (08/06 1326) SpO2:  [96 %-98 %] 98 % (08/06 1326)  Intake/Output from previous day: 08/05 0701 - 08/06 0700 In: 1212.3 [I.V.:1211.3] Out: 100 [Urine:100] Intake/Output this shift:     Recent Labs  11/08/12 1851 11/09/12 0550 11/09/12 2051 11/10/12 0610 11/11/12 0652  HGB 11.0* 10.0* 9.7* 7.9* 8.2*    Recent Labs  11/10/12 0610 11/11/12 0652  WBC 8.1 9.1  RBC 2.43* 2.61*  HCT 21.9* 23.7*  PLT 151 168    Recent Labs  11/10/12 0610 11/11/12 0652  NA 134* 136  K 4.4 4.4  CL 100 105  CO2 24 23  BUN 32* 38*  CREATININE 1.17* 1.11*  GLUCOSE 111* 79  CALCIUM 8.3* 8.4    Recent Labs  11/09/12 0550  INR 1.14    PE:  L LE NVI.  incisions dressed and dry.  Assessment/Plan: 2 Days Post-Op Procedure(s) (LRB): INTRAMEDULLARY (IM) NAIL FEMORAL/Left (Left) Up with therapy  WBAT on L LE.  OK for d/c to SNF at any time from my perspective.  i'll continue to follow.  D/c info updated in Epic.  Toni Arthurs 11/11/2012, 1:43 PM

## 2012-11-11 NOTE — Clinical Social Work Note (Signed)
Clinical Social Worker spoke with daughter and provided patient list of SNF bed offers received.  Rozetta Nunnery MSW, [employer] [phone]

## 2012-11-12 LAB — CBC
MCH: 31.9 pg (ref 26.0–34.0)
Platelets: 196 10*3/uL (ref 150–400)
RBC: 2.29 MIL/uL — ABNORMAL LOW (ref 3.87–5.11)

## 2012-11-12 MED ORDER — ENSURE COMPLETE PO LIQD
237.0000 mL | Freq: Two times a day (BID) | ORAL | Status: DC
  Administered 2012-11-12 – 2012-11-16 (×6): 237 mL via ORAL

## 2012-11-12 NOTE — Progress Notes (Signed)
Orthopedic Tech Progress Note Patient Details:  [name] February 16, 1917 [id number] Unable to ohf Patient ID: Danielle [name], female   DOB: [birth year], 79 y.o.   MRN: [id number]   Danielle [name] 11/12/2012, 6:09 PM

## 2012-11-12 NOTE — Progress Notes (Signed)
INITIAL NUTRITION ASSESSMENT  DOCUMENTATION CODES Per approved criteria  -Not Applicable   INTERVENTION:  Ensure Complete twice daily (350 kcals, 13 gm protein per 8 fl oz bottle) RD to follow for nutrition care plan  NUTRITION DIAGNOSIS: Increased nutrient needs related to hip fracture, post-op healing as evidenced by estimated nutrition needs  Goal: Pt to meet >/= 90% of their estimated nutrition needs   Monitor:  PO & supplemental intake, weight, labs, I/O's  Reason for Assessment: Low Braden   79 y.o. female  Admitting Dx: fall and hip pain  ASSESSMENT: Patient with PMH of CAD, CHF and HTN admitted after being found on the floor in kitchen; DG hip showed displaced intertrochanteric fracture.   Patient s/p procedures 8/5: INTRAMEDULLARY NAILING OF LEFT HIP INTERTROCHANTERIC FRACTURE  Patient sleeping upon RD visit; Rapid Response Event noted this AM; PO intake 0% per flowsheet records; at risk for skin breakdown given low braden score; would benefit from addition of nutrition supplements ---> RD to order.  Height: Ht Readings from Last 1 Encounters:  11/09/12 5\' 2"  (1.575 m)    Weight: Wt Readings from Last 1 Encounters:  11/09/12 126 lb 3.2 oz (57.244 kg)    Ideal Body Weight: 110 lb  % Ideal Body Weight: 114%  Wt Readings from Last 10 Encounters:  11/09/12 126 lb 3.2 oz (57.244 kg)  11/09/12 126 lb 3.2 oz (57.244 kg)    Usual Body Weight: unable to obtain  % Usual Body Weight: ---  BMI:  Body mass index is 23.08 kg/(m^2).  Estimated Nutritional Needs: Kcal: 1250-1450 Protein: 60-70 gm Fluid: >/= 1.5 L  Skin: left hip incision   Diet Order: General  EDUCATION NEEDS: -No education needs identified at this time   Intake/Output Summary (Last 24 hours) at 11/12/12 1344 Last data filed at 11/12/12 0900  Gross per 24 hour  Intake      0 ml  Output      0 ml  Net      0 ml    Labs:   Recent Labs Lab 11/09/12 0550 11/09/12 2051  11/10/12 0610 11/11/12 0652  NA 137  --  134* 136  K 3.1*  --  4.4 4.4  CL 101  --  100 105  CO2 26  --  24 23  BUN 24*  --  32* 38*  CREATININE 0.87 0.96 1.17* 1.11*  CALCIUM 8.8  --  8.3* 8.4  GLUCOSE 133*  --  111* 79    Scheduled Meds: . amLODipine  5 mg Oral Daily   And  . irbesartan  150 mg Oral Daily  . cloNIDine  0.2 mg Oral QHS  . docusate sodium  100 mg Oral BID  . enoxaparin (LOVENOX) injection  30 mg Subcutaneous Q24H  . metoprolol tartrate  25 mg Oral BID  . multivitamin with minerals  1 tablet Oral Daily  . senna  2 tablet Oral BID  . vitamin C  500 mg Oral Daily    Continuous Infusions: . sodium chloride 75 mL/hr at 11/12/12 1610    Past Medical History  Diagnosis Date  . Coronary artery disease   . CHF (congestive heart failure)   . Hypertension     Past Surgical History  Procedure Laterality Date  . Femur im nail Left 11/09/2012    Procedure: INTRAMEDULLARY (IM) NAIL FEMORAL/Left;  Surgeon: Toni Arthurs, MD;  Location: [facility] OR;  Service: Orthopedics;  Laterality: Left;    Maureen Chatters, RD, LDN Pager #:  [phone] After-Hours Pager #: [phone]

## 2012-11-12 NOTE — Progress Notes (Addendum)
Subjective:  Little better. No chest pain. Able to wiggle toes.  Objective:  Vital Signs in the last 24 hours: Temp:  [98.4 F (36.9 C)-99.5 F (37.5 C)] 98.9 F (37.2 C) (08/07 0644) Pulse Rate:  [77-90] 85 (08/07 0644) Cardiac Rhythm:  [-] Normal sinus rhythm (08/06 2130) Resp:  [16-18] 16 (08/07 0644) BP: (128-158)/(56-61) 147/61 mmHg (08/07 0644) SpO2:  [97 %-100 %] 97 % (08/07 0644)  Physical Exam: BP Readings from Last 1 Encounters:  11/12/12 147/61     Wt Readings from Last 1 Encounters:  11/09/12 57.244 kg (126 lb 3.2 oz)    Weight change:   HEENT: [place]/AT, Eyes-Brown, PERL, EOMI, Conjunctiva-Pale, Sclera-Non-icteric Neck: No JVD, No bruit, Trachea midline. Lungs:  Clear, Bilateral. Cardiac:  Regular rhythm, normal S1 and S2, no S3.  Abdomen:  Soft, non-tender. Extremities:  No edema present. No cyanosis. No clubbing. S/P left hip surgery. CNS: AxOx3, Cranial nerves grossly intact, moves all 4 extremities. Right handed. Skin: Warm and dry.   Intake/Output from previous day:      Lab Results: BMET    Component Value Date/Time   NA 136 11/11/2012 0652   K 4.4 11/11/2012 0652   CL 105 11/11/2012 0652   CO2 23 11/11/2012 0652   GLUCOSE 79 11/11/2012 0652   BUN 38* 11/11/2012 0652   CREATININE 1.11* 11/11/2012 0652   CALCIUM 8.4 11/11/2012 0652   GFRNONAA 41* 11/11/2012 0652   GFRAA 48* 11/11/2012 0652   CBC    Component Value Date/Time   WBC 8.6 11/12/2012 0724   RBC 2.29* 11/12/2012 0724   HGB 7.3* 11/12/2012 0724   HCT 20.6* 11/12/2012 0724   PLT 196 11/12/2012 0724   MCV 90.0 11/12/2012 0724   MCH 31.9 11/12/2012 0724   MCHC 35.4 11/12/2012 0724   RDW 13.8 11/12/2012 0724   LYMPHSABS 1.3 11/08/2012 1851   MONOABS 1.2* 11/08/2012 1851   EOSABS 0.0 11/08/2012 1851   BASOSABS 0.0 11/08/2012 1851   CARDIAC ENZYMES Lab Results  Component Value Date   CKTOTAL 1920* 11/08/2012   TROPONINI <0.30 11/09/2012    Scheduled Meds: . amLODipine  5 mg Oral Daily   And  . irbesartan  150 mg Oral  Daily  . cloNIDine  0.2 mg Oral QHS  . docusate sodium  100 mg Oral BID  . enoxaparin (LOVENOX) injection  30 mg Subcutaneous Q24H  . metoprolol tartrate  25 mg Oral BID  . multivitamin with minerals  1 tablet Oral Daily  . senna  2 tablet Oral BID  . vitamin C  500 mg Oral Daily   Continuous Infusions: . sodium chloride 75 mL/hr at 11/12/12 0652   PRN Meds:.acetaminophen, acetaminophen, alum & mag hydroxide-simeth, HYDROcodone-acetaminophen, menthol-cetylpyridinium, morphine injection, ondansetron (ZOFRAN) IV, ondansetron, phenol  Assessment/Plan: Left hip intertrochanteric fracture.  Intramedullary nailing of the left hip intertrochanteric fracture.  Dehydration-improving  Hypertension  CAD Acute blood loss anemia  D/C telemetry.-Resume Telemetry Increase activity as tolerated.   LOS: 4 days    Orpah Cobb  MD  11/12/2012, 9:14 AM     Vivien Rossetti

## 2012-11-12 NOTE — Progress Notes (Signed)
Physical Therapy Treatment Patient Details Name: ADESSA PRIMIANO MRN: [id number] DOB: [birth year] Today's Date: 11/12/2012 Time: 2130-8657 PT Time Calculation (min): 27 min  PT Assessment / Plan / Recommendation  History of Present Illness 79 y/o female, fell at home and sustained L hip fx now s/p IM nail and WBAT.    PT Comments   Limited again because of pain. Patient resistant to any AAROM to LLE. Screams throughout the entire session however appears grateful once she is in the chair. Did sit on stedy some today and got some weight through her feet with little resistance. Might be a combination of pain and anxiety.   Follow Up Recommendations  SNF     Does the patient have the potential to tolerate intense rehabilitation     Barriers to Discharge        Equipment Recommendations  None recommended by PT    Recommendations for Other Services    Frequency Min 3X/week   Progress towards PT Goals Progress towards PT goals:  (slow progress towards goals)  Plan Current plan remains appropriate    Precautions / Restrictions Precautions Precautions: Fall Restrictions Weight Bearing Restrictions: Yes LLE Weight Bearing: Weight bearing as tolerated   Pertinent Vitals/Pain Screaming in pain during session, repositioned for comfort    Mobility  Bed Mobility Bed Mobility: Supine to Sit Supine to Sit: 1: +2 Total assist Supine to Sit: Patient Percentage: 0% Details for Bed Mobility Assistance: totalA using pad to sit her up, pt very resistant because of pain Transfers Transfers: Sit to Stand;Stand to Sit Sit to Stand: 1: +2 Total assist;From elevated surface Sit to Stand: Patient Percentage: 30% Stand to Sit: 1: +2 Total assist Stand to Sit: Patient Percentage: 30% Transfer via Lift Equipment: Stedy Details for Transfer Assistance: stood x2 with stedy lift, needed bed elevated, assist to flex trunk over her feet and max facilitation for anterior translation of trunk over BOS as well  as extension through trunk Ambulation/Gait Ambulation/Gait Assistance: Not tested (comment)      PT Goals (current goals can now be found in the care plan section)    Visit Information  Last PT Received On: 11/12/12 Assistance Needed: +2 History of Present Illness: 79 y/o female, fell at home and sustained L hip fx now s/p IM nail and WBAT.     Subjective Data      Cognition  Cognition Arousal/Alertness: Awake/alert Behavior During Therapy: Anxious Overall Cognitive Status: No family/caregiver present to determine baseline cognitive functioning Area of Impairment: Attention;Following commands;Awareness;Problem solving;Safety/judgement Current Attention Level: Sustained Following Commands: Follows one step commands inconsistently;Follows one step commands with increased time Awareness: Intellectual Problem Solving: Decreased initiation;Difficulty sequencing;Requires verbal cues;Requires tactile cues General Comments: pt very anxious, slow to respond, resistant, needs extra time    Balance     End of Session PT - End of Session Equipment Utilized During Treatment: Gait belt Activity Tolerance: Patient limited by pain Patient left: in chair;with call bell/phone within reach;with chair alarm set Nurse Communication: Mobility status;Need for lift equipment   GP     Ludger Nutting 11/12/2012, 4:45 PM

## 2012-11-12 NOTE — Significant Event (Signed)
Rapid Response Event Note  Overview: called for tele monitor reading Afib Hr 130's      Initial Focused Assessment:  Upon arrival to patients room Rn at bedside.  RN states she got choked on her am pills and her HR went to 130's and monitor was reading Afib.  Monitor reviewed looks more like SR with frequent PAC's.  Patient lying in bed, moaning and getting a bath.  Skin warm and dry.  NAD noted.     Interventions:  EKG done results reviewed.  Dr. Algie Coffer paged and updated, will come see patient   Event Summary:  Rn to call if assistance needed   at      at          [facility], [place]es Amos

## 2012-11-12 NOTE — Clinical Documentation Improvement (Signed)
THIS DOCUMENT IS NOT A PERMANENT PART OF THE MEDICAL RECORD  Please update your documentation with the medical record to reflect your response to this query. If you need help knowing how to do this please call [phone].  11/12/12  Dear Dr. Orpah Cobb  Marton Redwood  In an effort to better capture your patient's severity of illness, reflect appropriate length of stay and utilization of resources, a review of the patient medical record has revealed the following indicators.    Based on your clinical judgment, please clarify and document in a progress note and/or discharge summary the clinical condition associated with the following supporting information:  In responding to this query please exercise your independent judgment.  The fact that a query is asked, does not imply that any particular answer is desired or expected.  Possible Clinical Conditions?   " Expected Acute Blood Loss Anemia  " Acute Blood Loss Anemia " Acute on chronic blood loss anemia  " Other Condition________________  " Cannot Clinically Determine    Risk Factors: (recent surgery, dehydration)  Supporting Information:  Signs and Symptoms (unable to ambulate, weakness, dizziness, unable to participate in care)  Diagnostics:  Recent Labs Per 8/6 progress notes    11/08/12 1851  11/09/12 0550  11/09/12 2051  11/10/12 0610  11/11/12 0652   HGB  11.0*  10.0*  9.7*  7.9*  8.2*     IV fluids given    Reviewed: additional documentation in the medical record  Thank You,  Shelda Pal RN, BSN, CCM     Clinical Documentation Specialist [phone] Health Information Management [place]

## 2012-11-13 LAB — PREPARE RBC (CROSSMATCH)

## 2012-11-13 LAB — FERRITIN: Ferritin: 240 ng/mL (ref 10–291)

## 2012-11-13 LAB — IRON AND TIBC
Iron: 21 ug/dL — ABNORMAL LOW (ref 42–135)
Saturation Ratios: 16 % — ABNORMAL LOW (ref 20–55)
TIBC: 133 ug/dL — ABNORMAL LOW (ref 250–470)
UIBC: 112 ug/dL — ABNORMAL LOW (ref 125–400)

## 2012-11-13 LAB — ABO/RH: ABO/RH(D): B POS

## 2012-11-13 MED ORDER — FERROUS SULFATE 325 (65 FE) MG PO TABS
325.0000 mg | ORAL_TABLET | Freq: Two times a day (BID) | ORAL | Status: DC
  Administered 2012-11-13 – 2012-11-16 (×7): 325 mg via ORAL
  Filled 2012-11-13 (×10): qty 1

## 2012-11-13 NOTE — Clinical Social Work Note (Signed)
Clinical Social Worker attempted to confirm SNF bed offer with daughter. Daughter reported that she is in discussion with her daughter and has not reached a decision yet. CSW encouraged daughter to make a decision in order to confirm that their preferred facility still has availability. CSW informed daughter that she will follow back up today after 1pm for a decision. Daughter voiced understanding. CSW is continuing to follow.   Rozetta Nunnery MSW, [employer] [phone]

## 2012-11-13 NOTE — Progress Notes (Signed)
Subjective:  Sitting in bed. Nurse assistant feeding patient.  Objective:  Vital Signs in the last 24 hours: Temp:  [98.6 F (37 C)-98.9 F (37.2 C)] 98.9 F (37.2 C) (08/08 0600) Pulse Rate:  [75-100] 100 (08/08 0600) Cardiac Rhythm:  [-] Atrial fibrillation;Other (Comment) (08/07 2100) Resp:  [16-18] 16 (08/08 0800) BP: (153-170)/(40-75) 154/74 mmHg (08/08 0600) SpO2:  [95 %-100 %] 97 % (08/08 0800)  Physical Exam: BP Readings from Last 1 Encounters:  11/13/12 154/74     Wt Readings from Last 1 Encounters:  11/09/12 57.244 kg (126 lb 3.2 oz)    Weight change:   HEENT: [place]/AT, Eyes-Brown, PERL, EOMI, Conjunctiva-Pale, Sclera-Non-icteric Neck: No JVD, No bruit, Trachea midline. Lungs:  Clear, Bilateral. Cardiac:  Regular rhythm, normal S1 and S2, no S3.  Abdomen:  Soft, non-tender. Extremities:  No edema present. No cyanosis. No clubbing. Tenderness on palpation to left hip, knee and ankle area post surgery. CNS: AxOx3, Cranial nerves grossly intact, moves all 4 extremities. Right handed. Skin: Warm and dry.   Intake/Output from previous day: 08/07 0701 - 08/08 0700 In: 4815 [P.O.:240; I.V.:4575] Out: 1 [Urine:1]    Lab Results: BMET    Component Value Date/Time   NA 136 11/11/2012 0652   K 4.4 11/11/2012 0652   CL 105 11/11/2012 0652   CO2 23 11/11/2012 0652   GLUCOSE 79 11/11/2012 0652   BUN 38* 11/11/2012 0652   CREATININE 1.11* 11/11/2012 0652   CALCIUM 8.4 11/11/2012 0652   GFRNONAA 41* 11/11/2012 0652   GFRAA 48* 11/11/2012 0652   CBC    Component Value Date/Time   WBC 8.6 11/12/2012 0724   RBC 2.29* 11/12/2012 0724   HGB 7.3* 11/12/2012 0724   HCT 20.6* 11/12/2012 0724   PLT 196 11/12/2012 0724   MCV 90.0 11/12/2012 0724   MCH 31.9 11/12/2012 0724   MCHC 35.4 11/12/2012 0724   RDW 13.8 11/12/2012 0724   LYMPHSABS 1.3 11/08/2012 1851   MONOABS 1.2* 11/08/2012 1851   EOSABS 0.0 11/08/2012 1851   BASOSABS 0.0 11/08/2012 1851   CARDIAC ENZYMES Lab Results  Component Value Date   CKTOTAL  1920* 11/08/2012   TROPONINI <0.30 11/09/2012    Scheduled Meds: . amLODipine  5 mg Oral Daily   And  . irbesartan  150 mg Oral Daily  . cloNIDine  0.2 mg Oral QHS  . docusate sodium  100 mg Oral BID  . enoxaparin (LOVENOX) injection  30 mg Subcutaneous Q24H  . feeding supplement  237 mL Oral BID BM  . ferrous sulfate  325 mg Oral BID WC  . metoprolol tartrate  25 mg Oral BID  . multivitamin with minerals  1 tablet Oral Daily  . senna  2 tablet Oral BID  . vitamin C  500 mg Oral Daily   Continuous Infusions: . sodium chloride 75 mL/hr at 11/12/12 0652   PRN Meds:.acetaminophen, acetaminophen, alum & mag hydroxide-simeth, HYDROcodone-acetaminophen, menthol-cetylpyridinium, morphine injection, ondansetron (ZOFRAN) IV, ondansetron, phenol  Assessment/Plan: Left hip intertrochanteric fracture.  Intramedullary nailing of the left hip intertrochanteric fracture.  Dehydration-improving  Hypertension  CAD  Acute blood loss anemia and iron deficiency anemia   Transfuse 1 unit Packed RBC and start iron supplements.   LOS: 5 days    Orpah Cobb  MD  11/13/2012, 8:52 AM

## 2012-11-13 NOTE — Progress Notes (Signed)
Subjective: 4 Days Post-Op Procedure(s) (LRB): INTRAMEDULLARY (IM) NAIL FEMORAL/Left (Left) Patient reports pain as mild.  Pending snf placement.  Objective: Vital signs in last 24 hours: Temp:  [98.6 F (37 C)-98.9 F (37.2 C)] 98.9 F (37.2 C) (08/08 0600) Pulse Rate:  [75-100] 100 (08/08 0600) Resp:  [18-20] 18 (08/08 0600) BP: (153-170)/(40-75) 154/74 mmHg (08/08 0600) SpO2:  [95 %-100 %] 100 % (08/08 0600)  Intake/Output from previous day: 08/07 0701 - 08/08 0700 In: 4098.8 [P.O.:240; I.V.:3858.8] Out: 1 [Urine:1] Intake/Output this shift:     Recent Labs  11/11/12 0652 11/12/12 0724  HGB 8.2* 7.3*    Recent Labs  11/11/12 0652 11/12/12 0724  WBC 9.1 8.6  RBC 2.61* 2.29*  HCT 23.7* 20.6*  PLT 168 196    Recent Labs  11/11/12 0652  NA 136  K 4.4  CL 105  CO2 23  BUN 38*  CREATININE 1.11*  GLUCOSE 79  CALCIUM 8.4   No results found for this basename: LABPT, INR,  in the last 72 hours  PE:  Left hip wounds dressed and dry.  Wiggle stoes and feel s LT in left foot.  Assessment/Plan: 4 Days Post-Op Procedure(s) (LRB): INTRAMEDULLARY (IM) NAIL FEMORAL/Left (Left) Discharge to SNF when cleared by primary team.  OK at any time from my perspective.  Dry dressing to left hip wounds daily and as needed.  F/u with me in clinic in two weeks.  Lovenox for two weeks post op then switch to aspirin.  Orders and d/c info updated in epic.  I'll sign off.  Pls call with any questions.  [phone].  Toni Arthurs 11/13/2012, 7:46 AM

## 2012-11-14 LAB — TYPE AND SCREEN: Unit division: 0

## 2012-11-14 LAB — CBC
Hemoglobin: 8.6 g/dL — ABNORMAL LOW (ref 12.0–15.0)
MCH: 30.4 pg (ref 26.0–34.0)
MCHC: 34.3 g/dL (ref 30.0–36.0)
RDW: 16.3 % — ABNORMAL HIGH (ref 11.5–15.5)

## 2012-11-14 NOTE — Progress Notes (Signed)
Subjective:  Patient complains of generalized musculoskeletal pain. Denies any chest pain or shortness of breath.  Objective:  Vital Signs in the last 24 hours: Temp:  [98.2 F (36.8 C)-99.1 F (37.3 C)] 98.2 F (36.8 C) (08/09 0610) Pulse Rate:  [65-83] 73 (08/09 0610) Resp:  [14-18] 16 (08/09 0610) BP: (118-176)/(55-82) 138/67 mmHg (08/09 0610) SpO2:  [96 %-98 %] 98 % (08/09 0610)  Intake/Output from previous day: 08/08 0701 - 08/09 0700 In: 532 [P.O.:120; I.V.:50; Blood:362] Out: 1 [Urine:1] Intake/Output from this shift:    Physical Exam: Neck: no adenopathy, no carotid bruit, no JVD and supple, symmetrical, trachea midline Lungs: Clear to auscultation anterolaterally Heart: regular rate and rhythm, S1, S2 normal, no murmur, click, rub or gallop Abdomen: soft, non-tender; bowel sounds normal; no masses,  no organomegaly Extremities: extremities normal, atraumatic, no cyanosis or edema  Lab Results:  Recent Labs  11/12/12 0724 11/14/12 0435  WBC 8.6 7.6  HGB 7.3* 8.6*  PLT 196 220   No results found for this basename: NA, K, CL, CO2, GLUCOSE, BUN, CREATININE,  in the last 72 hours No results found for this basename: TROPONINI, CK, MB,  in the last 72 hours Hepatic Function Panel No results found for this basename: PROT, ALBUMIN, AST, ALT, ALKPHOS, BILITOT, BILIDIR, IBILI,  in the last 72 hours No results found for this basename: CHOL,  in the last 72 hours No results found for this basename: PROTIME,  in the last 72 hours  Imaging: Imaging results have been reviewed and No results found.  Cardiac Studies:  Assessment/Plan:  Left hip intertrochanteric fracture.  Intramedullary nailing of the left hip intertrochanteric fracture.  Status post Dehydration Hypertension  CAD  Acute blood loss anemia and iron deficiency anemia  Plan At the present management Out of bed to chair Ambulate as tolerated  Awaiting skilled nursing facility  LOS: 6 days     Haniyah Maciolek N 11/14/2012, 11:23 AM

## 2012-11-14 NOTE — Progress Notes (Addendum)
CSW spoke with patient's daughter, Rivka Barbara (ph#: [phone]) re: SNF bed offers - daughter is accepting bed offer @ [facility] [place] [facility]. Weekday CSW, Verlon Au to follow-up Monday re: discharge.   Clinical Social Work Department CLINICAL SOCIAL WORK PLACEMENT NOTE 11/14/2012  Patient:  ARRYN, [name]  Account Number:  [id number] Admit date:  11/08/2012  Clinical Social Worker:  Hulan Fray  Date/time:  11/10/2012 10:42 AM  Clinical Social Work is seeking post-discharge placement for this patient at the following level of care:   SKILLED NURSING   (*CSW will update this form in Epic as items are completed)   11/10/2012  Patient/family provided with [facility] Department of Clinical Social Work's list of facilities offering this level of care within the geographic area requested by the patient (or if unable, by the patient's family).  11/10/2012  Patient/family informed of their freedom to choose among providers that offer the needed level of care, that participate in Medicare, Medicaid or managed care program needed by the patient, have an available bed and are willing to accept the patient.  11/10/2012  Patient/family informed of [facility]' ownership interest in [facility], as well as of the fact that they are under no obligation to receive care at this facility.  PASARR submitted to EDS on 11/10/2012 PASARR number received from EDS on 11/10/2012  FL2 transmitted to all facilities in geographic area requested by pt/family on  11/10/2012 FL2 transmitted to all facilities within larger geographic area on   Patient informed that his/her managed care company has contracts with or will negotiate with  certain facilities, including the following:     Patient/family informed of bed offers received:  11/13/2012 Patient chooses bed at [facility], [place] Physician recommends and patient chooses bed at    Patient to be transferred to [facility], [place] on   Patient to be transferred to facility by [facility]  The following physician request were entered in Epic:   Additional Comments:   [name], LCSW Clinical Social Worker cell #: [phone] (weekend coverage)

## 2012-11-15 MED ORDER — DOCUSATE SODIUM 50 MG/5ML PO LIQD
100.0000 mg | Freq: Two times a day (BID) | ORAL | Status: DC
  Administered 2012-11-15 – 2012-11-16 (×2): 100 mg via ORAL
  Filled 2012-11-15 (×3): qty 10

## 2012-11-15 NOTE — Progress Notes (Signed)
Subjective:  Patient denies any chest pain or shortness of breath. Complains of generalized bodyache  Objective:  Vital Signs in the last 24 hours: Temp:  [98 F (36.7 C)-98.8 F (37.1 C)] 98.8 F (37.1 C) (08/10 0500) Pulse Rate:  [55-86] 81 (08/10 0500) Resp:  [18-20] 18 (08/10 0800) BP: (135-168)/(59-88) 161/81 mmHg (08/10 0500) SpO2:  [93 %-100 %] 93 % (08/10 0800)  Intake/Output from previous day: 08/09 0701 - 08/10 0700 In: 2402.5 [P.O.:100; I.V.:2302.5] Out: -  Intake/Output from this shift:    Physical Exam: Neck: no adenopathy, no carotid bruit, no JVD and supple, symmetrical, trachea midline Lungs: Clear to auscultation anterolaterally Heart: regular rate and rhythm, S1, S2 normal, no murmur, click, rub or gallop Abdomen: soft, non-tender; bowel sounds normal; no masses,  no organomegaly Extremities: extremities normal, atraumatic, no cyanosis or edema and Surgical site left hip ok.  Lab Results:  Recent Labs  11/14/12 0435  WBC 7.6  HGB 8.6*  PLT 220   No results found for this basename: NA, K, CL, CO2, GLUCOSE, BUN, CREATININE,  in the last 72 hours No results found for this basename: TROPONINI, CK, MB,  in the last 72 hours Hepatic Function Panel No results found for this basename: PROT, ALBUMIN, AST, ALT, ALKPHOS, BILITOT, BILIDIR, IBILI,  in the last 72 hours No results found for this basename: CHOL,  in the last 72 hours No results found for this basename: PROTIME,  in the last 72 hours  Imaging: Imaging results have been reviewed and No results found.  Cardiac Studies:  Assessment/Plan:  Left hip intertrochanteric fracture.  Intramedullary nailing of the left hip intertrochanteric fracture.  Status post Dehydration  Hypertension  CAD  Acute blood loss anemia and iron deficiency anemia  Plan Continue present management Awaiting skilled nursing facility Out of bed to chair Ambulate as tolerated   LOS: 7 days    Dawsen Krieger  N 11/15/2012, 9:57 AM

## 2012-11-16 ENCOUNTER — Non-Acute Institutional Stay (SKILLED_NURSING_FACILITY): Payer: Medicare Other | Admitting: Internal Medicine

## 2012-11-16 ENCOUNTER — Encounter: Payer: Self-pay | Admitting: Internal Medicine

## 2012-11-16 DIAGNOSIS — S72142D Displaced intertrochanteric fracture of left femur, subsequent encounter for closed fracture with routine healing: Secondary | ICD-10-CM

## 2012-11-16 DIAGNOSIS — S72142A Displaced intertrochanteric fracture of left femur, initial encounter for closed fracture: Secondary | ICD-10-CM | POA: Insufficient documentation

## 2012-11-16 DIAGNOSIS — D649 Anemia, unspecified: Secondary | ICD-10-CM | POA: Insufficient documentation

## 2012-11-16 DIAGNOSIS — I10 Essential (primary) hypertension: Secondary | ICD-10-CM

## 2012-11-16 DIAGNOSIS — S72009D Fracture of unspecified part of neck of unspecified femur, subsequent encounter for closed fracture with routine healing: Secondary | ICD-10-CM

## 2012-11-16 DIAGNOSIS — I2510 Atherosclerotic heart disease of native coronary artery without angina pectoris: Secondary | ICD-10-CM

## 2012-11-16 LAB — CREATININE, SERUM
Creatinine, Ser: 0.72 mg/dL (ref 0.50–1.10)
GFR calc non Af Amer: 72 mL/min — ABNORMAL LOW (ref >90)

## 2012-11-16 MED ORDER — CLONIDINE HCL 0.2 MG PO TABS: 0.2000 mg | ORAL_TABLET | Freq: Every day | ORAL | Status: DC

## 2012-11-16 MED ORDER — ENSURE COMPLETE PO LIQD: 237.0000 mL | Freq: Two times a day (BID) | ORAL | Status: DC

## 2012-11-16 MED ORDER — DOCUSATE SODIUM 50 MG/5ML PO LIQD: 100.0000 mg | Freq: Two times a day (BID) | ORAL | Status: DC

## 2012-11-16 MED ORDER — IRON 325 (65 FE) MG PO TABS: 1.0000 | ORAL_TABLET | Freq: Two times a day (BID) | ORAL | Status: AC

## 2012-11-16 NOTE — Discharge Summary (Signed)
Physician Discharge Summary  Patient ID: Danielle [name] MRN: [id number] DOB/AGE: 1916-10-29 79 y.o.  Admit date: 11/08/2012 Discharge date: 11/16/2012  Admission Diagnoses: Left hip intertrochanteric fracture.  Intramedullary nailing of the left hip intertrochanteric fracture.  Hypertension  CAD  Dehydrations     Discharge Diagnoses:  Principle Problem: * Left hip intertrochanteric fracture. * Intramedullary nailing of the left hip intertrochanteric fracture.  Hypertension  CAD  Acute blood loss anemia and iron deficiency anemia  Dehydration-resolved  Discharged Condition: fair  Hospital Course: 79 years old who lives alone was found on floor in kitchen.She was preparing coffee, lost balance and fell. She had no LOC. She suffered left hip intertrochanteric fracture. She was also dehydrated. She was treated with IV fluids and orthopedic consult was obtained. She underwent Intramedullary nailing of the left hip. She suffered acute blood loss anemia requiring one unit of Packed RBC transfusion. Per family and patient request she was transferred to area skilled nursing facility for additional PT/OT.   Consults: orthopedic surgery  Significant Diagnostic Studies: labs: Mildly elevated WBC count, mildly low HgB of 11 and normal platelets count. Near normal electrolytes, renal and liver function tests.   Treatments: IV hydration and surgery: Intramedullary nailing of the left hip by Dr. Toni Arthurs.  Discharge Exam: Blood pressure 138/71, pulse 88, temperature 98.2 F (36.8 C), temperature source Oral, resp. rate 16, height 5\' 2"  (1.575 m), weight 57.244 kg (126 lb 3.2 oz), SpO2 99.00%. HEENT: [place]/AT, Eyes-Brown, PERL, EOMI, Conjunctiva-Pale, Sclera-Non-icteric  Neck: No JVD, No bruit, Trachea midline.  Lungs: Clear, Bilateral.  Cardiac: Regular rhythm, normal S1 and S2, no S3.  Abdomen: Soft, non-tender.  Extremities: No edema present. No cyanosis. No clubbing. Left hip And lateral  lower thigh area surgical scars with staples, are healing well.  CNS: AxOx3, Cranial nerves grossly intact, moves all 4 extremities. Right handed.  Skin: Warm and dry.  Disposition: 03-Skilled Nursing Home  Discharge Orders   Future Orders Complete By Expires     Weight bearing as tolerated  As directed         Medication List         aspirin EC 81 MG tablet  Take 81 mg by mouth daily.     AZOR 5-20 MG per tablet  Generic drug:  amLODipine-olmesartan  Take 1 tablet by mouth daily.     cloNIDine 0.2 MG tablet  Commonly known as:  CATAPRES  Take 1 tablet (0.2 mg total) by mouth at bedtime.     docusate 50 MG/5ML liquid  Commonly known as:  COLACE  Take 10 mLs (100 mg total) by mouth 2 (two) times daily.     enoxaparin 30 MG/0.3ML injection  Commonly known as:  LOVENOX  Inject 0.3 mLs (30 mg total) into the skin daily.     feeding supplement Liqd  Take 237 mLs by mouth 2 (two) times daily between meals.     furosemide 20 MG tablet  Commonly known as:  LASIX  Take 20 mg by mouth daily.     HYDROcodone-acetaminophen 5-325 MG per tablet  Commonly known as:  NORCO/VICODIN  Take 1 tablet by mouth every 6 (six) hours as needed.     Iron 325 (65 FE) MG Tabs  Take 1 tablet by mouth 2 (two) times daily.     metoprolol tartrate 25 MG tablet  Commonly known as:  LOPRESSOR  Take 25 mg by mouth 2 (two) times daily.     multivitamin with minerals tablet  Take 1 tablet by mouth daily.     vitamin C 500 MG tablet  Commonly known as:  ASCORBIC ACID  Take 500 mg by mouth daily.           Follow-up Information   Follow up with HEWITT, Jonny Ruiz, MD. Schedule an appointment as soon as possible for a visit in 2 weeks.   Contact information:   [address] [place] [address] [phone]       Follow up with Pola Corn, MD. Schedule an appointment as soon as possible for a visit in 2 months.   Contact information:   [address] [place] [address] [phone]       Signed: Ricki Rodriguez 11/16/2012, 2:41 PM

## 2012-11-16 NOTE — Progress Notes (Signed)
Physical Therapy Treatment Patient Details Name: Danielle [name] MRN: [id number] DOB: October 08, 1916 Today's Date: 11/16/2012 Time: 9528-4132 PT Time Calculation (min): 21 min  PT Assessment / Plan / Recommendation  History of Present Illness 79 y/o female, fell at home and sustained L hip fx now s/p IM nail and WBAT.    PT Comments   Pt mobility very limited by pain and anxiety.    Follow Up Recommendations  SNF     Does the patient have the potential to tolerate intense rehabilitation     Barriers to Discharge        Equipment Recommendations  None recommended by PT    Recommendations for Other Services    Frequency Min 3X/week   Progress towards PT Goals Progress towards PT goals: Progressing toward goals  Plan Current plan remains appropriate    Precautions / Restrictions Precautions Precautions: Fall Restrictions Weight Bearing Restrictions: Yes LLE Weight Bearing: Weight bearing as tolerated   Pertinent Vitals/Pain Did not rate, but yells with any mobility.      Mobility  Bed Mobility Bed Mobility: Supine to Sit;Sitting - Scoot to Edge of Bed;Sit to Supine Supine to Sit: 1: +2 Total assist Supine to Sit: Patient Percentage: 10% Sitting - Scoot to Edge of Bed: 1: +2 Total assist Sitting - Scoot to Edge of Bed: Patient Percentage: 0% Sit to Supine: 1: +2 Total assist Sit to Supine: Patient Percentage: 0% Details for Bed Mobility Assistance: totalA using pad to sit her up, pt very resistant because of pain Transfers Transfers: Sit to Stand;Stand to Sit Sit to Stand: 1: +2 Total assist;With upper extremity assist;From bed Sit to Stand: Patient Percentage: 10% Stand to Sit: 1: +2 Total assist;With upper extremity assist;To bed Stand to Sit: Patient Percentage: 10% Transfer via Lift Equipment: Stedy Details for Transfer Assistance: Attempted to stand x2 with use of stedy.  pt with increased difficulty coming to full stand and remains in squat position despite use of  pad under hips.   Ambulation/Gait Ambulation/Gait Assistance: Not tested (comment) Stairs: No Wheelchair Mobility Wheelchair Mobility: No    Exercises     PT Diagnosis:    PT Problem List:   PT Treatment Interventions:     PT Goals (current goals can now be found in the care plan section) Acute Rehab PT Goals Time For Goal Achievement: 11/17/12 Potential to Achieve Goals: Fair  Visit Information  Last PT Received On: 11/16/12 Assistance Needed: +2 History of Present Illness: 79 y/o female, fell at home and sustained L hip fx now s/p IM nail and WBAT.     Subjective Data  Subjective: "I'm going to yell."   Cognition  Cognition Arousal/Alertness: Awake/alert Behavior During Therapy: Anxious Overall Cognitive Status: No family/caregiver present to determine baseline cognitive functioning Area of Impairment: Attention;Following commands;Awareness;Problem solving;Safety/judgement Current Attention Level: Sustained Following Commands: Follows one step commands inconsistently;Follows one step commands with increased time Awareness: Intellectual Problem Solving: Decreased initiation;Difficulty sequencing;Requires verbal cues;Requires tactile cues General Comments: pt very anxious, slow to respond, resistant, needs extra time    Balance  Balance Balance Assessed: Yes Static Sitting Balance Static Sitting - Balance Support: Feet supported;Bilateral upper extremity supported Static Sitting - Level of Assistance: 3: Mod assist Static Sitting - Comment/# of Minutes: ModA on average, but fluctuates between Min - Max A.    End of Session PT - End of Session Equipment Utilized During Treatment: Gait belt Activity Tolerance: Patient limited by pain Patient left: in bed;with call bell/phone within reach  Nurse Communication: Mobility status;Need for lift equipment   GP     Sunny Schlein, [place] [phone] 11/16/2012, 1:33 PM

## 2012-11-16 NOTE — Clinical Social Work Note (Signed)
Clinical Social Worker facilitated discharge by contacting daughter and facility, [facility]. Patient will be transported via ambulance. CSW will complete discharge packet, with DNR form inside and place with shadow chart. CSW will sign off, as social work intervention is no longer needed.   Rozetta Nunnery MSW, [employer] [phone]

## 2012-11-16 NOTE — Assessment & Plan Note (Signed)
Continue present meds

## 2012-11-16 NOTE — Assessment & Plan Note (Signed)
Stable -continue present meds 

## 2012-11-16 NOTE — Progress Notes (Signed)
MRN: [id number] Name: Danielle [name]  Sex: female Age: 79 y.o. DOB: [birth year]  PSC #: [email or web address] Facility/Room:  125 Level Of Care: SNF Provider: Merrilee Seashore D Emergency Contacts: Extended Emergency Contact Information Primary Emergency Contact: [name],[name] Address: [address]          [place] [address] [place] Home Phone: [phone] Relation: Daughter  Code Status:   Allergies: Keflex and Penicillins  Chief Complaint  Patient presents with  . nursing home admission    HPI: Patient is 79 y.o. female who fell at home and broke her hip.She is s/p repair of L hip and here for PT and nutritional support  Past Medical History  Diagnosis Date  . Coronary artery disease   . CHF (congestive heart failure)   . Hypertension   . Anemia     acute blood loss from hip surgery 11/2012    Past Surgical History  Procedure Laterality Date  . Femur im nail Left 11/09/2012    Procedure: INTRAMEDULLARY (IM) NAIL FEMORAL/Left;  Surgeon: Toni Arthurs, MD;  Location: [facility] OR;  Service: Orthopedics;  Laterality: Left;      Medication List    Notice   This visit is during an admission. Changes to the med list made in this visit will be reflected in the After Visit Summary of the admission.    Medications were reviewed for admission per ADAMD   No orders of the defined types were placed in this encounter.     There is no immunization history on file for this patient.  History  Substance Use Topics  . Smoking status: Never Smoker   . Smokeless tobacco: Never Used  . Alcohol Use: No    Family history is noncontributory    Review of Systems  DATA OBTAINED: from patient; PT HAS NO C/O ;HIP PAIN IS NOT BAD GENERAL: Feels well no fevers, fatigue, appetite changes SKIN: No itching, rash or wounds EYES: No eye pain, redness, discharge EARS: No earache, tinnitus, change in hearing NOSE: No congestion, drainage or bleeding  MOUTH/THROAT: No mouth or  tooth pain, No sore throat, RESPIRATORY: No cough, wheezing, SOB CARDIAC: No chest pain, palpitations, lower extremity edema  GI: No abdominal pain, No N/V/D or constipation, No heartburn or reflux  GU: No dysuria, frequency or urgency, or incontinence  MUSCULOSKELETAL: No unrelieved bone/joint pain NEUROLOGIC: Awake, alert, appropriate to situation, PSYCHIATRIC: No overt anxiety or sadness. Sleeps well. No behavior issue.     Filed Vitals:   04/18/13 2234  BP: 153/93  Pulse: 60  Temp: 98.6 F (37 C)  Resp: 20    Physical Exam  GENERAL APPEARANCE: Alert, conversant. Appropriately groomed. No acute distress.  SKIN: No diaphoresis rash;DRESSING L HIP HEAD: Normocephalic, atraumatic  EYES: Conjunctiva/lids clear. Pupils round, reactive. EOMs intact.  EARS: External exam WNL, canals clear. Hearing grossly normal.  NOSE: No deformity or discharge.  MOUTH/THROAT: Lips w/o lesions. RESPIRATORY: Breathing is even, unlabored. Lung sounds are clear   CARDIOVASCULAR: Heart RRR no murmurs, rubs or gallops. No peripheral edema.   GASTROINTESTINAL: Abdomen is soft, non-tender, not distended w/ normal bowel sounds. GENITOURINARY: Bladder non tender, not distended  MUSCULOSKELETAL: No abnormal joints or musculature NEUROLOGIC: . Cranial nerves 2-12 grossly intact. Moves all extremities no tremor. PSYCHIATRIC: Mood and affect appropriate to situation, no behavioral issues  Patient Active Problem List   Diagnosis Date Noted  . Closed supracondylar fracture of left elbow with routine healing 04/09/2013  . UTI (urinary tract infection)  04/09/2013  . Dementia without behavioral disturbance 04/09/2013  . Failure to thrive 03/31/2013  . DNR (do not resuscitate) 03/31/2013  . Encounter for long-term (current) use of other medications 01/11/2013  . Acute venous embolism and thrombosis of deep vessels of distal lower extremity 12/30/2012  . Unspecified hypothyroidism 12/23/2012  . Chronic systolic  congestive heart failure 12/23/2012  . Acute posthemorrhagic anemia 12/09/2012  . Unspecified constipation 11/23/2012  . Intertrochanteric fracture of left hip 11/16/2012  . Coronary artery disease   . Hypertension   . Anemia     Functional assessment:   CBC    Component Value Date/Time   WBC 7.8 04/05/2013 0630   RBC 3.33* 04/05/2013 0630   HGB 10.5* 04/05/2013 0630   HCT 30.6* 04/05/2013 0630   PLT 204 04/05/2013 0630   MCV 91.9 04/05/2013 0630   LYMPHSABS 1.2 03/31/2013 1648   MONOABS 1.1* 03/31/2013 1648   EOSABS 0.0 03/31/2013 1648   BASOSABS 0.0 03/31/2013 1648    CMP     Component Value Date/Time   NA 136 04/05/2013 0630   K 3.6 04/05/2013 0630   CL 100 04/05/2013 0630   CO2 26 04/05/2013 0630   GLUCOSE 122* 04/05/2013 0630   BUN 23 04/05/2013 0630   CREATININE 0.91 04/05/2013 0630   CALCIUM 7.9* 04/05/2013 0630   PROT 6.3 03/31/2013 1648   ALBUMIN 2.9* 03/31/2013 1648   AST 52* 03/31/2013 1648   ALT 23 03/31/2013 1648   ALKPHOS 90 03/31/2013 1648   BILITOT 0.9 03/31/2013 1648   GFRNONAA 52* 04/05/2013 0630   GFRAA 61* 04/05/2013 0630    Assessment and Plan  Coronary artery disease Stable-continue present meds  Hypertension Continue present meds  Anemia From surgery-will recheck CBC for 3 days from now; pt is on iron  Intertrochanteric fracture of left hip Pt is here for OT/PT which she will be getting;also for improved nutrition;she was dehydrated on arrival to [facility] so will recheck BMP    Margit Hanks, MD

## 2012-11-16 NOTE — Assessment & Plan Note (Addendum)
From surgery-will recheck CBC for 3 days from now; pt is on iron

## 2012-11-16 NOTE — Progress Notes (Signed)
Telemetry discontinued per Dr Algie Coffer and pt is getting ready for discharge

## 2012-11-16 NOTE — Progress Notes (Signed)
Attempted to call pt's daughter and grand daughter to let them know that patient was being transferred to [facility].  SW had spoken with dtr earlier today.  Pt ready for discharge via PT[facility] to [facility].

## 2012-11-16 NOTE — Progress Notes (Signed)
Report given to [place] at [facility]. No questions at this time. PTAR has been called.  Will cont to monitor.

## 2012-11-16 NOTE — Progress Notes (Signed)
Subjective:  Little improved per patient. Afebrile.  Objective:  Vital Signs in the last 24 hours: Temp:  [97.7 F (36.5 C)-98.5 F (36.9 C)] 97.7 F (36.5 C) (08/11 0522) Pulse Rate:  [61-82] 61 (08/11 0522) Cardiac Rhythm:  [-] Atrial fibrillation (08/10 2320) Resp:  [18] 18 (08/11 0522) BP: (126-192)/(66-97) 126/66 mmHg (08/11 0522) SpO2:  [93 %-100 %] 100 % (08/11 0522)  Physical Exam: BP Readings from Last 1 Encounters:  11/16/12 126/66     Wt Readings from Last 1 Encounters:  11/09/12 57.244 kg (126 lb 3.2 oz)    Weight change:   HEENT: [place]/AT, Eyes-Brown, PERL, EOMI, Conjunctiva-Pale, Sclera-Non-icteric Neck: No JVD, No bruit, Trachea midline. Lungs:  Clear, Bilateral. Cardiac:  Regular rhythm, normal S1 and S2, no S3.  Abdomen:  Soft, non-tender. Extremities:  No edema present. No cyanosis. No clubbing. Left hip  And lateral lower thigh area surgical scars with staples, are healing well. CNS: AxOx3, Cranial nerves grossly intact, moves all 4 extremities. Right handed. Skin: Warm and dry.   Intake/Output from previous day: 08/10 0701 - 08/11 0700 In: 1841.3 [I.V.:1841.3] Out: -     Lab Results: BMET    Component Value Date/Time   NA 136 11/11/2012 0652   K 4.4 11/11/2012 0652   CL 105 11/11/2012 0652   CO2 23 11/11/2012 0652   GLUCOSE 79 11/11/2012 0652   BUN 38* 11/11/2012 0652   CREATININE 0.72 11/16/2012 0614   CALCIUM 8.4 11/11/2012 0652   GFRNONAA 72* 11/16/2012 0614   GFRAA 83* 11/16/2012 0614   CBC    Component Value Date/Time   WBC 7.6 11/14/2012 0435   RBC 2.83* 11/14/2012 0435   HGB 8.6* 11/14/2012 0435   HCT 25.1* 11/14/2012 0435   PLT 220 11/14/2012 0435   MCV 88.7 11/14/2012 0435   MCH 30.4 11/14/2012 0435   MCHC 34.3 11/14/2012 0435   RDW 16.3* 11/14/2012 0435   LYMPHSABS 1.3 11/08/2012 1851   MONOABS 1.2* 11/08/2012 1851   EOSABS 0.0 11/08/2012 1851   BASOSABS 0.0 11/08/2012 1851   CARDIAC ENZYMES Lab Results  Component Value Date   CKTOTAL 1920* 11/08/2012   TROPONINI <0.30 11/09/2012    Scheduled Meds: . amLODipine  5 mg Oral Daily   And  . irbesartan  150 mg Oral Daily  . cloNIDine  0.2 mg Oral QHS  . docusate  100 mg Oral BID  . enoxaparin (LOVENOX) injection  30 mg Subcutaneous Q24H  . feeding supplement  237 mL Oral BID BM  . ferrous sulfate  325 mg Oral BID WC  . metoprolol tartrate  25 mg Oral BID  . multivitamin with minerals  1 tablet Oral Daily  . senna  2 tablet Oral BID  . vitamin C  500 mg Oral Daily   Continuous Infusions: . sodium chloride 75 mL/hr at 11/15/12 1900   PRN Meds:.acetaminophen, acetaminophen, alum & mag hydroxide-simeth, HYDROcodone-acetaminophen, menthol-cetylpyridinium, morphine injection, ondansetron (ZOFRAN) IV, ondansetron, phenol  Assessment/Plan: Left hip intertrochanteric fracture.  Intramedullary nailing of the left hip intertrochanteric fracture.  Hypertension  CAD  Acute blood loss anemia and iron deficiency anemia   Awaiting NH placement   LOS: 8 days    Orpah Cobb  MD  11/16/2012, 9:38 AM

## 2012-11-16 NOTE — Assessment & Plan Note (Signed)
Pt is here for OT/PT which she will be getting;also for improved nutrition;she was dehydrated on arrival to [facility] so will recheck BMP

## 2012-11-17 ENCOUNTER — Other Ambulatory Visit: Payer: Self-pay | Admitting: Geriatric Medicine

## 2012-11-17 MED ORDER — HYDROCODONE-ACETAMINOPHEN 5-325 MG PO TABS: 1.0000 | ORAL_TABLET | Freq: Four times a day (QID) | ORAL | Status: DC | PRN

## 2012-11-23 ENCOUNTER — Non-Acute Institutional Stay (SKILLED_NURSING_FACILITY): Payer: Medicare Other | Admitting: Internal Medicine

## 2012-11-23 ENCOUNTER — Encounter: Payer: Self-pay | Admitting: Internal Medicine

## 2012-11-23 DIAGNOSIS — I10 Essential (primary) hypertension: Secondary | ICD-10-CM

## 2012-11-23 DIAGNOSIS — K5900 Constipation, unspecified: Secondary | ICD-10-CM

## 2012-11-23 DIAGNOSIS — D649 Anemia, unspecified: Secondary | ICD-10-CM

## 2012-11-23 DIAGNOSIS — S72009D Fracture of unspecified part of neck of unspecified femur, subsequent encounter for closed fracture with routine healing: Secondary | ICD-10-CM

## 2012-11-23 DIAGNOSIS — S72142D Displaced intertrochanteric fracture of left femur, subsequent encounter for closed fracture with routine healing: Secondary | ICD-10-CM

## 2012-11-23 DIAGNOSIS — I2510 Atherosclerotic heart disease of native coronary artery without angina pectoris: Secondary | ICD-10-CM

## 2012-11-23 NOTE — Progress Notes (Signed)
MRN: [id number] Name: Danielle [name]  Sex: female Age: 79 y.o. DOB: 30-Mar-1917  PSC #: Danielle [name] Facility/Room:  125 Level Of Care: SNF Provider: Merrilee [name] D Emergency Contacts: Extended Emergency Contact Information Primary Emergency Contact: [name],Danielle Address: [address]          [place] [address] [place] Home Phone: [phone] Relation: Daughter  Code Status: DNR  Allergies: Keflex and Penicillins  Chief Complaint  Patient presents with  . nursing home discharge    HPI: Patient is 79 y.o. female who is being d/c to [facility] to continue her PT after hip surgery to be closer to friends.  Past Medical History  Diagnosis Date  . Coronary artery disease   . CHF (congestive heart failure)   . Hypertension   . Anemia     acute blood loss from hip surgery 11/2012    Past Surgical History  Procedure Laterality Date  . Femur im nail Left 11/09/2012    Procedure: INTRAMEDULLARY (IM) NAIL FEMORAL/Left;  Surgeon: Danielle Arthurs, MD;  Location: [facility] OR;  Service: Orthopedics;  Laterality: Left;      Medication List       This list is accurate as of: 11/23/12  1:13 PM.  Always use your most recent med list.               aspirin EC 81 MG tablet  Take 81 mg by mouth daily.     AZOR 5-20 MG per tablet  Generic drug:  amLODipine-olmesartan  Take 1 tablet by mouth daily.     cloNIDine 0.2 MG tablet  Commonly known as:  CATAPRES  Take 1 tablet (0.2 mg total) by mouth at bedtime.     docusate 50 MG/5ML liquid  Commonly known as:  COLACE  Take 10 mLs (100 mg total) by mouth 2 (two) times daily.     enoxaparin 30 MG/0.3ML injection  Commonly known as:  LOVENOX  Inject 0.3 mLs (30 mg total) into the skin daily.     furosemide 20 MG tablet  Commonly known as:  LASIX  Take 20 mg by mouth daily.     HYDROcodone-acetaminophen 5-325 MG per tablet  Commonly known as:  NORCO/VICODIN  Take 1 tablet by mouth every 6 (six) hours as  needed.     Iron 325 (65 FE) MG Tabs  Take 1 tablet by mouth 2 (two) times daily.     metoprolol tartrate 25 MG tablet  Commonly known as:  LOPRESSOR  Take 25 mg by mouth 2 (two) times daily.     multivitamin with minerals tablet  Take 1 tablet by mouth daily.     SANTYL ointment  Generic drug:  collagenase  Apply 1 application topically daily. To right anterior hip     vitamin C 500 MG tablet  Commonly known as:  ASCORBIC ACID  Take 500 mg by mouth daily.        Meds ordered this encounter  Medications  . collagenase (SANTYL) ointment    Sig: Apply 1 application topically daily. To right anterior hip     There is no immunization history on file for this patient.  History  Substance Use Topics  . Smoking status: Never Smoker   . Smokeless tobacco: Never Used  . Alcohol Use: No    Family history is noncontributory     Review of Systems  DATA OBTAINED: from patient, nurse - pt says therapy going well;c/o only that she has had  no BM in 2 days, can feel it in her rectum but cant get it out   Filed Vitals:   11/23/12 1159  BP: 151/93  Pulse: 60  Temp: 98.6 F (37 C)  Resp: 20    Physical Exam  GENERAL APPEARANCE: Alert, conversant. Appropriately groomed. No acute distress. HEENT- unremarkable RESPIRATORY: Breathing is even, unlabored. Lung sounds are clear   CARDIOVASCULAR: Heart RRR no murmurs, rubs or gallops. No peripheral edema.  GASTROINTESTINAL: Abdomen is soft, non-tender, not distended w/ normal bowel sounds GENITOURINARY: Bladder non tender, not distended  MUSCULOSKELETAL: No abnormal joints or musculature NEUROLOGIC: Cranial nerves 2-12 grossly intact. Moves all extremities no tremor. PSYCHIATRIC: Mood and affect appropriate to situation, no behavioral issues  Patient Active Problem List   Diagnosis Date Noted  . Unspecified constipation 11/23/2012  . Intertrochanteric fracture of left hip 11/16/2012  . Coronary artery disease   .  Hypertension   . Anemia     Pt had CXR 8/13 because she refused PPD which showed atelectasis vs R intersitial pneumonitis which was felt to be atelectais because she had no signs or sx   CBC    Component Value Date/Time   WBC 7.6 11/14/2012 0435   RBC 2.83* 11/14/2012 0435   HGB 8.6* 11/14/2012 0435   HCT 25.1* 11/14/2012 0435   PLT 220 11/14/2012 0435   MCV 88.7 11/14/2012 0435   LYMPHSABS 1.3 11/08/2012 1851   MONOABS 1.2* 11/08/2012 1851   EOSABS 0.0 11/08/2012 1851   BASOSABS 0.0 11/08/2012 1851    CMP     Component Value Date/Time   NA 136 11/11/2012 0652   K 4.4 11/11/2012 0652   CL 105 11/11/2012 0652   CO2 23 11/11/2012 0652   GLUCOSE 79 11/11/2012 0652   BUN 38* 11/11/2012 0652   CREATININE 0.72 11/16/2012 0614   CALCIUM 8.4 11/11/2012 0652   PROT 7.7 11/08/2012 1851   ALBUMIN 3.5 11/08/2012 1851   AST 58* 11/08/2012 1851   ALT 16 11/08/2012 1851   ALKPHOS 85 11/08/2012 1851   BILITOT 0.7 11/08/2012 1851   GFRNONAA 72* 11/16/2012 0614   GFRAA 83* 11/16/2012 0614    Assessment and Plan   Pt is going to [facility]  to continue post hip arthroscopy PT to be near family; today she c/o no BM for 2 days AND WE DIDN'T GIVE HER ANYTHING SINCE SHE WOULD BE ENROUTE SO SHE NEEDS SOMETHING WHEN SHE GETS THERE.  Pt needs appt with Dr Danielle [name] in 2 months and Dr Danielle [name] in a week (2 weeks from 8/11)   Danielle Hanks, MD

## 2012-11-23 NOTE — Assessment & Plan Note (Signed)
Just learned pt has  Had BM in 2 days;she says she feels it in her rectum but won't come out. Fecal impaction possible but secondary to fact pt with be in a vehicle we thought best to delay tx until she gets to [facility]

## 2012-11-24 ENCOUNTER — Other Ambulatory Visit: Payer: Self-pay | Admitting: Geriatric Medicine

## 2012-11-24 ENCOUNTER — Non-Acute Institutional Stay (SKILLED_NURSING_FACILITY): Payer: Medicare Other | Admitting: Internal Medicine

## 2012-11-24 DIAGNOSIS — I509 Heart failure, unspecified: Secondary | ICD-10-CM

## 2012-11-24 DIAGNOSIS — S72009S Fracture of unspecified part of neck of unspecified femur, sequela: Secondary | ICD-10-CM

## 2012-11-24 DIAGNOSIS — S72142S Displaced intertrochanteric fracture of left femur, sequela: Secondary | ICD-10-CM

## 2012-11-24 DIAGNOSIS — I10 Essential (primary) hypertension: Secondary | ICD-10-CM

## 2012-11-24 DIAGNOSIS — I2510 Atherosclerotic heart disease of native coronary artery without angina pectoris: Secondary | ICD-10-CM

## 2012-11-24 MED ORDER — HYDROCODONE-ACETAMINOPHEN 5-325 MG PO TABS: 1.0000 | ORAL_TABLET | Freq: Four times a day (QID) | ORAL | Status: DC | PRN

## 2012-11-30 ENCOUNTER — Non-Acute Institutional Stay (SKILLED_NURSING_FACILITY): Payer: Medicare Other | Admitting: Internal Medicine

## 2012-11-30 DIAGNOSIS — G4700 Insomnia, unspecified: Secondary | ICD-10-CM

## 2012-11-30 DIAGNOSIS — R609 Edema, unspecified: Secondary | ICD-10-CM

## 2012-11-30 DIAGNOSIS — D649 Anemia, unspecified: Secondary | ICD-10-CM

## 2012-11-30 DIAGNOSIS — I10 Essential (primary) hypertension: Secondary | ICD-10-CM

## 2012-11-30 NOTE — Progress Notes (Signed)
Patient ID: Danielle [name], female   DOB: 02-01-1917, 79 y.o.   MRN: [id number]  This is an acute visit.  Level care skilled.  Facility [facility].  Date is 11/30/2012.  Chief complaint-acute visit secondary to left leg edema-insomnia-hypertension.  History of present illness.  Patient is a 79 year old female recently transferred here from another facility-she does have a history of recent left hip fracture she is on Lovenox for anticoagulation.  She is also on low-dose Lasix secondary to a history of CHF.  She also has a history of hypertension she is on Catapres 0.2 mg each bedtime blood pressures appear to be somewhat elevated recently 143/77 156/78 and  1 73/84  Her main complaint today is insomnia she would like something to help her sleep she does not really complain of any chest pain shortness of breath or acute leg discomfort.  Family medical social history has been reviewed per progress note on 11/24/2012.  Medications have been reviewed per MAR.  Review of systems.  General she denies any fever or chills.  Skin does not complaining of rash or itching.  Respiratory no complaints of shortness of breath or cough.  Cardiac does not complaining of chest pain she does have some edema of her left leg.  GI he does not complain of nausea or vomiting diarrhea or constipation.  Muscle skeletal is not complaining of joint pain does have a history of left hip replacement.  Neurologic does not complaining of dizziness or headache.  Psych she is complaining of insomnia.  Physical exam.  Temperature is 98.9 pulse 66 respirations 19 blood pressure 143/77 previous blood pressures as noted above.  General this is a pleasant elderly female in no distress.  Her skin is warm and dry.  Oropharynx is clear mucous membranes moist.  Chest is clear to auscultation without rhonchi rales or wheezes.  Heart is regular rate and rhythm without murmur gallop or rub she does have -2+  left leg edema with a reduced pedal pulse is not acutely warm or tender however.  Abdomen is soft nontender with positive bowel sounds.  Muscle skeletal-again most remarkable for the edema of her left leg status post surgery.  Neurologic is grossly intact her speech is clear I do not see any lateralizing findings.  Psych she is alert and oriented x3.  Labs.  11/14/2012.  WBC 7.6 hemoglobin 8.6 platelets 220.  Sodium 136 potassium 4.4 BUN 38 creatinine 0.72.    11/08/2012.  WBC 12.5 hemoglobin 11.0 platelets 195.  Sodium 139 potassium 3.7 BUN 19 creatinine 0.74.  Liver function tests within normal limits except AST of 58.  Assessment and plan.  #1-left leg edema this is concerning for its somewhat relative increase size-will order a venous Doppler rule out anything acute. Like a DVT  #2-hypertension systolics appear to be fairly consistently elevated we'll increase her Catapres to 0.2 mg twice a day check blood pressures every shift with a log for provider review.    #3-anemia-she is on iron recent hemoglobin that I see is 8.6 will be important to get an updated CBC for followup-clinically she appears stable-  #4-insomnia-Will start low-dose Ambien 5 mg each bedtime when necessary and monitor  [phone]

## 2012-12-01 ENCOUNTER — Non-Acute Institutional Stay (SKILLED_NURSING_FACILITY): Payer: Medicare Other | Admitting: Internal Medicine

## 2012-12-01 DIAGNOSIS — I824Z9 Acute embolism and thrombosis of unspecified deep veins of unspecified distal lower extremity: Secondary | ICD-10-CM

## 2012-12-01 DIAGNOSIS — I824Z2 Acute embolism and thrombosis of unspecified deep veins of left distal lower extremity: Secondary | ICD-10-CM

## 2012-12-04 ENCOUNTER — Non-Acute Institutional Stay (SKILLED_NURSING_FACILITY): Payer: Medicare Other | Admitting: Internal Medicine

## 2012-12-04 DIAGNOSIS — S71001A Unspecified open wound, right hip, initial encounter: Secondary | ICD-10-CM

## 2012-12-04 DIAGNOSIS — S71009A Unspecified open wound, unspecified hip, initial encounter: Secondary | ICD-10-CM

## 2012-12-04 NOTE — Progress Notes (Signed)
Patient ID: Danielle [name], female   DOB: 08-25-1916, 79 y.o.   MRN: [id number] [facility]; [name] Chief complaint: Review of right thigh wound His; this is a 79 year old woman who apparently fell at home fracturing her left hip. She came out of the hospital with orders for [facility] today [name] on the right anterior hip. She was temporarily at [facility] nursing home where I see no description of this area. I was asked to see this today due to increasing order and nonresponse to the [facility]. The patient is not able to tolerate very much about this although I get the sense that she feels that this happened at the time of her initial fall.  Physical exam; Gen. she is afebrile certainly does not look systemically Skin; there is a necrotic area anteriorly just above her inguinal area. This is covered with a very purulent surface slough.. there is indeed a very obnoxious order. The area itself is not draining there does not seem to be any tenderness. Had the staff do a complete skin check to make sure that there was nothing else that could be producing a malodorous area. Nothing was found. I did not have anything to anesthetize this area therefore I simply open the area with a scalpel and cultured for C&S. I suspect there is more underneath this than meets the eye however I am not able to do anything about this currently. I was not convinced there was an underlying abscess based on the absence of tenderness palpable fluctuation etc.  Impression/plan #1 unstageable alone in the right anterior upper thigh. This is in a very unusual position for a pressure ulcer. Nevertheless with the odor I thought empiric antibiotics were indicated and I started her on Levaquin and Flagyl. The area will be dressed with silver alginate. I will attempt a further debridement of this next week. Lab work is been ordered including a sedimentation rate. Imaging through this area may be necessary including a CT scan

## 2012-12-08 ENCOUNTER — Non-Acute Institutional Stay (SKILLED_NURSING_FACILITY): Payer: Medicare Other | Admitting: Internal Medicine

## 2012-12-08 DIAGNOSIS — D62 Acute posthemorrhagic anemia: Secondary | ICD-10-CM

## 2012-12-09 DIAGNOSIS — D62 Acute posthemorrhagic anemia: Secondary | ICD-10-CM | POA: Insufficient documentation

## 2012-12-09 NOTE — Progress Notes (Signed)
PROGRESS NOTE  DATE: 12/08/2012  FACILITY:  [facility]  LEVEL OF CARE: SNF (31)  Acute Visit  CHIEF COMPLAINT:  Manage acute blood loss anemia  HISTORY OF PRESENT ILLNESS: I was requested by the staff to assess the patient regarding above problem(s):  ANEMIA: The anemia has been stable. The patient denies fatigue, melena or hematochezia. No complications from the medications currently being used. On 12-08-12 hemoglobin 9.8, MCV 89.5. On 11-27-12 hemoglobin 10.1.  PAST MEDICAL HISTORY : Reviewed.  No changes.  CURRENT MEDICATIONS: Reviewed per [facility]  PHYSICAL EXAMINATION  GENERAL: no acute distress, normal body habitus RESPIRATORY: breathing is even & unlabored, BS CTAB CARDIAC: RRR, no murmur,no extra heart sounds, +2 edema on the left and +1 edema on the right  LABS/RADIOLOGY: See history of present illness  ASSESSMENT/PLAN:  Acute blood loss anemia-hemoglobin improved. Continue iron  CPT CODE: 16109

## 2012-12-11 ENCOUNTER — Non-Acute Institutional Stay (SKILLED_NURSING_FACILITY): Payer: Medicare Other | Admitting: Internal Medicine

## 2012-12-11 DIAGNOSIS — S71009A Unspecified open wound, unspecified hip, initial encounter: Secondary | ICD-10-CM

## 2012-12-11 DIAGNOSIS — S71001A Unspecified open wound, right hip, initial encounter: Secondary | ICD-10-CM

## 2012-12-11 NOTE — Progress Notes (Signed)
Patient ID: Danielle [name], female   DOB: 1916/10/15, 79 y.o.   MRN: [id number]  Facility; [facility] Chief complaint: Review of right thigh wound His; this is a 79 year old woman who apparently fell at home fracturing her left hip. She came out of the hospital with orders for Santyl to a wound by and and in her and I will in her and on the right anterior hip. She was temporarily at [facility] nursing home where I see no description of this area. I was asked to see this today due to increasing odor and nonresponse to the Santyl. The patient is not able to tell me very much about this although I get the sense that she feels that this happened at the time of her initial fall. When I saw her last week the older was sold noxious from this area that I felt there might be something else contributing to this although nothing was found. Culture of this area did not grow anything.  Physical exam; Gen. she is afebrile certainly does not look systemically Skin; there is a necrotic area anteriorly just above her inguinal area. This looks considerably better than last week although there is still an adherent surface slough. The odor is remarkably better.  There is no surrounding tenderness perhaps minimal erythema. There is a small linear area in the middle of the wound that probes a centimeter.  Impression/plan #1 unstageable wound in the right anterior upper thigh. This is in a very unusual position for a pressure ulcer nevertheless I suspect this is what form dose perhaps lying in usual position perioperatively. This will require further enzymatic debridement therefore we will switch to Santyl. No further antibiotics are required.

## 2012-12-14 ENCOUNTER — Non-Acute Institutional Stay (SKILLED_NURSING_FACILITY): Payer: Medicare Other | Admitting: Adult Health

## 2012-12-14 DIAGNOSIS — I824Z9 Acute embolism and thrombosis of unspecified deep veins of unspecified distal lower extremity: Secondary | ICD-10-CM

## 2012-12-14 DIAGNOSIS — D649 Anemia, unspecified: Secondary | ICD-10-CM

## 2012-12-14 DIAGNOSIS — I509 Heart failure, unspecified: Secondary | ICD-10-CM

## 2012-12-14 DIAGNOSIS — I10 Essential (primary) hypertension: Secondary | ICD-10-CM

## 2012-12-16 ENCOUNTER — Non-Acute Institutional Stay (SKILLED_NURSING_FACILITY): Payer: Medicare Other | Admitting: Internal Medicine

## 2012-12-16 DIAGNOSIS — Z79899 Other long term (current) drug therapy: Secondary | ICD-10-CM

## 2012-12-16 DIAGNOSIS — I509 Heart failure, unspecified: Secondary | ICD-10-CM

## 2012-12-16 DIAGNOSIS — I10 Essential (primary) hypertension: Secondary | ICD-10-CM

## 2012-12-22 ENCOUNTER — Non-Acute Institutional Stay (SKILLED_NURSING_FACILITY): Payer: Medicare Other | Admitting: Internal Medicine

## 2012-12-22 DIAGNOSIS — E039 Hypothyroidism, unspecified: Secondary | ICD-10-CM

## 2012-12-22 DIAGNOSIS — I2510 Atherosclerotic heart disease of native coronary artery without angina pectoris: Secondary | ICD-10-CM

## 2012-12-22 DIAGNOSIS — I10 Essential (primary) hypertension: Secondary | ICD-10-CM

## 2012-12-22 DIAGNOSIS — I509 Heart failure, unspecified: Secondary | ICD-10-CM

## 2012-12-23 DIAGNOSIS — E039 Hypothyroidism, unspecified: Secondary | ICD-10-CM | POA: Insufficient documentation

## 2012-12-23 DIAGNOSIS — I5022 Chronic systolic (congestive) heart failure: Secondary | ICD-10-CM | POA: Insufficient documentation

## 2012-12-23 NOTE — Progress Notes (Signed)
PROGRESS NOTE  DATE: 12/22/2012  FACILITY: Nursing Home Location: [facility]  LEVEL OF CARE: SNF (31)  Routine Visit  CHIEF COMPLAINT:  Manage hypothyroidism, CHF and hypertension  HISTORY OF PRESENT ILLNESS:  REASSESSMENT OF ONGOING PROBLEM(S):  HYPOTHYROIDISM: New problem. No complications noted from the medications presently being used.  The patient denies fatigue or constipation.  Last TSH 7.172 on 12-17-12.  CHF:The patient does not relate significant weight changes, denies sob, DOE, orthopnea, PNDs, palpitations or chest pain.  CHF remains stable.  No complications form the medications being used. Patient has chronic lower extremity swelling.  HTN: Pt 's HTN remains stable.  Denies CP, sob, DOE, headaches, dizziness or visual disturbances.  No complications from the medications currently being used.  Last BP : 169/79.  PAST MEDICAL HISTORY : Reviewed.  No changes.  CURRENT MEDICATIONS: Reviewed per [facility]  REVIEW OF SYSTEMS:  GENERAL: no change in appetite, no fatigue, no weight changes, no fever, chills or weakness RESPIRATORY: no cough, SOB, DOE, wheezing, hemoptysis CARDIAC: no chest pain, or palpitations, complains of chronic lower extremity swelling GI: no abdominal pain, diarrhea, constipation, heart burn, nausea or vomiting  PHYSICAL EXAMINATION  VS:  T 98.5      P 74     RR 18     BP 169/79     POX %     WT (Lb) 142  GENERAL: no acute distress, normal body habitus EYES: conjunctivae normal, sclerae normal, normal eye lids NECK: supple, trachea midline, no neck masses, no thyroid tenderness, no thyromegaly LYMPHATICS: no LAN in the neck, no supraclavicular LAN RESPIRATORY: breathing is even & unlabored, BS CTAB CARDIAC: RRR, no murmur,no extra heart sounds, +2 bilateral lower extremity edema GI: abdomen soft, normal BS, no masses, no tenderness, no hepatomegaly, no splenomegaly PSYCHIATRIC: the patient is alert & oriented to person,  affect & behavior appropriate  LABS/RADIOLOGY:  9-14 hemoglobin 9.8, MCV 89.5 otherwise CBC normal, albumin 3.2 otherwise CMP normal, BNP 304.5  ASSESSMENT/PLAN:  Hypothyroidism-new problem. Recheck TSH in 6 weeks. CHF-well compensated Hypertension-blood pressure elevated. Will review her log. CAD-stable Anemia of chronic disease-continue iron. Constipation-well-controlled. Left lower extremity DVT-on xarelto.  CPT CODE: 16109

## 2012-12-23 NOTE — Progress Notes (Signed)
Patient ID: [facility]le [name], female   DOB: 07/31/1916, 79 y.o.   MRN: [id number]        HISTORY & PHYSICAL  DATE: 11/24/2012   FACILITY: [facility]  LEVEL OF CARE: SNF (31)  ALLERGIES:  Allergies  Allergen Reactions  . Keflex [Cephalexin] Itching  . Penicillins Itching    CHIEF COMPLAINT:  Manage coronary artery disease, CHF, and hypertension.    HISTORY OF PRESENT ILLNESS:  79 year-old, African-American female transferred to this facility from [facility] for short-term rehabilitation.  She has the following problems:    CAD: The angina has been stable. The patient denies dyspnea on exertion, orthopnea, pedal edema, palpitations and paroxysmal nocturnal dyspnea. No complications noted from the medication presently being used.    CHF:The patient does not relate significant weight changes, denies sob, DOE, orthopnea, PNDs, pedal edema, palpitations or chest pain.  CHF remains stable.  No complications form the medications being used.    HTN: Pt 's HTN remains stable.  Denies CP, sob, DOE, pedal edema, headaches, dizziness or visual disturbances.  No complications from the medications currently being used.  Last BP :  97/61.    PAST MEDICAL HISTORY :  Past Medical History  Diagnosis Date  . Coronary artery disease   . CHF (congestive heart failure)   . Hypertension   . Anemia     acute blood loss from hip surgery 11/2012    PAST SURGICAL HISTORY: Past Surgical History  Procedure Laterality Date  . Femur im nail Left 11/09/2012    Procedure: INTRAMEDULLARY (IM) NAIL FEMORAL/Left;  Surgeon: Toni Arthurs, MD;  Location: [facility] OR;  Service: Orthopedics;  Laterality: Left;    SOCIAL HISTORY:  reports that she has never smoked. She has never used smokeless tobacco. She reports that she does not drink alcohol.  FAMILY HISTORY: None  CURRENT MEDICATIONS: Reviewed per MAR  REVIEW OF SYSTEMS:   GENERAL:  Complains of feeling fatigued.    See HPI  otherwise 14 point ROS is negative.  PHYSICAL EXAMINATION  VS:  T 98.1       P 60     RR 18      BP 97/61      POX%        WT (Lb)  GENERAL: no acute distress, normal body habitus SKIN: warm & dry, no suspicious lesions or rashes, no excessive dryness EYES: conjunctivae normal, sclerae normal, normal eye lids MOUTH/THROAT: lips without lesions,no lesions in the mouth,tongue is without lesions,uvula elevates in midline NECK: supple, trachea midline, no neck masses, no thyroid tenderness, no thyromegaly LYMPHATICS: no LAN in the neck, no supraclavicular LAN RESPIRATORY: breathing is even & unlabored, BS CTAB CARDIAC: RRR, no murmur,no extra heart sounds, no edema GI:  ABDOMEN: abdomen soft, normal BS, no masses, no tenderness  LIVER/SPLEEN: no hepatomegaly, no splenomegaly MUSCULOSKELETAL: HEAD: normal to inspection & palpation BACK: no kyphosis, scoliosis or spinal processes tenderness EXTREMITIES: LEFT UPPER EXTREMITY: full range of motion, normal strength & tone RIGHT UPPER EXTREMITY:  full range of motion, normal strength & tone LEFT LOWER EXTREMITY: strength intact, range of motion unable to assess   RIGHT LOWER EXTREMITY: strength intact, range of motion unable to assess    PSYCHIATRIC: the patient is alert & oriented to person, affect & behavior appropriate  LABS/RADIOLOGY: WBC 12.4, hemoglobin 11, MCV 88.6, platelets 195.     Total CK 1620, AST 58, otherwise CMP normal.       Urinalysis  negative.    Chest x-ray:  No acute disease.    Right shoulder x-ray:  No fracture or dislocation.    Left hip x-ray:  Showed displaced intertrochanteric fracture.    CT of the head:  No acute findings.    CT of the cervical spine:  No acute findings.    ASSESSMENT/PLAN:  CAD.  Stable.    CHF.  Well compensated.    Hypertension.  Well controlled.    Left hip intertrochanteric fracture.  Status post IM nail.    Check CBC and BMP.    I have reviewed patient's medical records  received at admission/from hospitalization.  CPT CODE: 29528

## 2012-12-30 DIAGNOSIS — I824Z9 Acute embolism and thrombosis of unspecified deep veins of unspecified distal lower extremity: Secondary | ICD-10-CM | POA: Insufficient documentation

## 2012-12-30 NOTE — Progress Notes (Signed)
Patient ID: [facility]le [name], female   DOB: 08-07-1916, 79 y.o.   MRN: [id number]        PROGRESS NOTE  DATE: 12/01/2012  FACILITY:  [facility]  LEVEL OF CARE: SNF (31)  Acute Visit  CHIEF COMPLAINT:  Manage left greater saphenous thrombosis.    HISTORY OF PRESENT ILLNESS: I was requested by the staff to assess the patient regarding above problem(s):  This patient had a venous doppler done of the left lower extremity due to increasing edema.  Patient is status post surgery for left hip fracture.  Patient is complaining of pain in the left lower extremity, but denies status post or shortness of breath.       PAST MEDICAL HISTORY : Reviewed.  No changes.  CURRENT MEDICATIONS: Reviewed per [facility]  REVIEW OF SYSTEMS:  GENERAL: no change in appetite, no fatigue, no weight changes, no fever, chills or weakness RESPIRATORY: no cough, SOB, DOE,, wheezing, hemoptysis CARDIAC: no chest pain or palpitations; increasing left lower extremity swelling and pain   GI: no abdominal pain, diarrhea, constipation, heart burn, nausea or vomiting  PHYSICAL EXAMINATION  GENERAL: no acute distress, normal body habitus EYES: conjunctivae normal, sclerae normal, normal eye lids NECK: supple, trachea midline, no neck masses, no thyroid tenderness, no thyromegaly LYMPHATICS: no LAN in the neck, no supraclavicular LAN RESPIRATORY: breathing is even & unlabored, BS CTAB CARDIAC: RRR, no murmur,no extra heart sounds EDEMA/VARICOSITIES: left lower extremity has +3 edema, right lower extremity had +1 edema   ARTERIAL: pedal pulses diminished   GI: abdomen soft, normal BS, no masses, no tenderness, no hepatomegaly, no splenomegaly PSYCHIATRIC: the patient is alert & oriented to person, affect & behavior appropriate  LABS/RADIOLOGY: Left lower extremity venous ultrasound did not show DVT, but there is thrombosis in the greater saphenous vein.    ASSESSMENT/PLAN:  Left lower extremity  thrombosis.  Given increasing edema and pain, would treat with anticoagulation.  Start Xarelto 15 mg b.i.d. for 21 days, then 20 mg q.d.  Discontinue Lovenox.    CPT CODE: 84132

## 2013-01-11 DIAGNOSIS — Z79899 Other long term (current) drug therapy: Secondary | ICD-10-CM | POA: Insufficient documentation

## 2013-01-11 NOTE — Progress Notes (Signed)
Patient ID: [facility]le [name], female   DOB: 1916/08/14, 79 y.o.   MRN: [id number]        PROGRESS NOTE  DATE: 12/16/2012  FACILITY:  [facility]  LEVEL OF CARE: SNF (31)  Acute Visit  CHIEF COMPLAINT:  Manage increased lower extremity swelling.    HISTORY OF PRESENT ILLNESS: I was requested by the staff to assess the patient regarding above problem(s):  CHF:  Staff report that patient is having increased bilateral lower extremity swelling.  Patient denies shortness of breath or chest pain.  CHF is unstable.  No complications form the medications being used.    PAST MEDICAL HISTORY : Reviewed.  No changes.  CURRENT MEDICATIONS: Reviewed per [facility]  REVIEW OF SYSTEMS:  GENERAL: no change in appetite, no fatigue, no weight changes, no fever, chills or weakness RESPIRATORY: no cough, SOB, DOE,, wheezing, hemoptysis CARDIAC: no chest pain or palpitations; increasing lower extremity swelling   GI: no abdominal pain, diarrhea, constipation, heart burn, nausea or vomiting  PHYSICAL EXAMINATION  VS:  T 97.6       P 71      RR 20      BP 169/79     POX %       WT (Lb)  GENERAL: no acute distress, normal body habitus EYES: conjunctivae normal, sclerae normal, normal eye lids NECK: supple, trachea midline, no neck masses, no thyroid tenderness, no thyromegaly LYMPHATICS: no LAN in the neck, no supraclavicular LAN RESPIRATORY: breathing is even & unlabored, BS CTAB CARDIAC: RRR, no murmur,no extra heart sounds EDEMA/VARICOSITIES:  +3 bilateral lower extremity edema  ARTERIAL:  pedal pulses nonpalpable   GI: abdomen soft, normal BS, no masses, no tenderness, no hepatomegaly, no splenomegaly PSYCHIATRIC: the patient is alert & oriented to person, affect & behavior appropriate  LABS/RADIOLOGY: 12/08/2012:  Albumin 3.2, otherwise CMP normal.     ASSESSMENT/PLAN:  CHF exacerbation.  Significant new problem.  Increase Lasix to 40 mg b.i.d.  Obtain 2D-echo, TSH, and  BNP.    V58.69.  Check BMP on 12/21/2012.    Hypertension.  Blood pressure uncontrolled.  Should be addressed with increased Lasix.    CPT CODE: 82956

## 2013-01-12 ENCOUNTER — Non-Acute Institutional Stay (SKILLED_NURSING_FACILITY): Payer: Medicare Other | Admitting: Internal Medicine

## 2013-01-12 DIAGNOSIS — E039 Hypothyroidism, unspecified: Secondary | ICD-10-CM

## 2013-01-12 DIAGNOSIS — I509 Heart failure, unspecified: Secondary | ICD-10-CM

## 2013-01-12 DIAGNOSIS — I2510 Atherosclerotic heart disease of native coronary artery without angina pectoris: Secondary | ICD-10-CM

## 2013-01-12 DIAGNOSIS — I10 Essential (primary) hypertension: Secondary | ICD-10-CM

## 2013-01-20 ENCOUNTER — Non-Acute Institutional Stay (SKILLED_NURSING_FACILITY): Payer: Medicare Other | Admitting: Family

## 2013-01-20 ENCOUNTER — Encounter: Payer: Self-pay | Admitting: Family

## 2013-01-20 DIAGNOSIS — I509 Heart failure, unspecified: Secondary | ICD-10-CM

## 2013-01-20 DIAGNOSIS — W19XXXS Unspecified fall, sequela: Secondary | ICD-10-CM

## 2013-01-20 DIAGNOSIS — I10 Essential (primary) hypertension: Secondary | ICD-10-CM

## 2013-01-20 NOTE — Progress Notes (Signed)
Patient ID: [facility]le [name], female   DOB: 12-Sep-1916, 79 y.o.   MRN: [id number] Date: 01/18/13  Facility: [facility]  Code Status:  Full  Chief Complaint  Patient presents with  . Discharge Note    HPI: Pt is being followed for the medical management of chronic illness. Pt presents to [facility] for short-term rehabilitation s/p fall and subsequent Left  femur fracture. Therapeutic outcome obtained with therapy and medical management. Pt is being discharged from facility to home with health care disciplines ordered for evaluation and treatment.       Allergies  Allergen Reactions  . Keflex [Cephalexin] Itching  . Penicillins Itching      DATA REVIEWED    Laboratory Studies: 12/21/12- Na 140, K 4.2, Cl 98, BUN 18, Creatinine 1.0     Past Medical History  Diagnosis Date  . Coronary artery disease   . CHF (congestive heart failure)   . Hypertension   . Anemia     acute blood loss from hip surgery 11/2012     Past Surgical History  Procedure Laterality Date  . Femur im nail Left 11/09/2012    Procedure: INTRAMEDULLARY (IM) NAIL FEMORAL/Left;  Surgeon: Toni Arthurs, MD;  Location: [facility] OR;  Service: Orthopedics;  Laterality: Left;     History   Social History  . Marital Status: Legally Separated    Spouse Name: N/A    Number of Children: N/A  . Years of Education: N/A   Occupational History  . Not on file.   Social History Main Topics  . Smoking status: Never Smoker   . Smokeless tobacco: Never Used  . Alcohol Use: No  . Drug Use: Not on file  . Sexual Activity: No   Other Topics Concern  . Not on file   Social History Narrative  . No narrative on file     Review of Systems  Constitutional: Negative.   HENT: Negative.   Eyes: Negative.   Cardiovascular: Negative.   Gastrointestinal: Negative.   Genitourinary: Negative.   Musculoskeletal: Positive for joint pain.       Intermittent L hip pain  Skin:       Healed Incision to L hip  Neurological:  Negative.   Endo/Heme/Allergies: Negative.   Psychiatric/Behavioral: Negative.      Physical Exam Filed Vitals:   01/18/13 1138  BP: 158/78  Pulse: 75  Temp: 98 F (36.7 C)  Resp: 18   There is no weight on file to calculate BMI. Physical Exam  Constitutional: She is oriented to person, place, and time.  Pt dressed and groomed appropriately; sitting upright in W/C with grand-daughter by bedside  Eyes: Conjunctivae are normal.  Neck: No thyromegaly present.  Cardiovascular: Normal rate, regular rhythm and normal heart sounds.   Pulmonary/Chest: Effort normal and breath sounds normal.  Neurological: She is alert and oriented to person, place, and time.  Skin: Skin is warm, dry and intact.  Granulating L hip incision; Xerosis of BLE      ASSESSMENT/PLAN  Discharge orders for Home Health Nursing- for medication management, PT-for exercise, OT-for assistance with ADL execution and CNA-for ADL assistance Xerosis-Advised pt to use Eucerin cream or thick emoillent to knee to ankle for excessive dryness.  Prescriptions provided for 30 days Safety plan in place-discussed adherence with [facility] Alert protocol-Pt currently uses [facility] for safety precautions Pt advised to schedule appt with PCP within one week of discharge Pt and pt  Grand-daughter verbalizes understanding and agrees with plan

## 2013-01-22 ENCOUNTER — Other Ambulatory Visit: Payer: Self-pay | Admitting: *Deleted

## 2013-01-22 MED ORDER — RIVAROXABAN 20 MG PO TABS: 20.0000 mg | ORAL_TABLET | Freq: Every day | ORAL | Status: DC

## 2013-01-22 NOTE — Progress Notes (Signed)
PROGRESS NOTE  DATE: 01-12-13  FACILITY: Nursing Home Location: [facility]  LEVEL OF CARE: SNF (31)  Routine Visit  CHIEF COMPLAINT:  Manage hypothyroidism, CHF and hypertension  HISTORY OF PRESENT ILLNESS:  REASSESSMENT OF ONGOING PROBLEM(S):  HYPOTHYROIDISM: New problem. No complications noted from the medications presently being used.  The patient denies fatigue or constipation.  Last TSH 7.172 on 12-17-12.  CHF:The patient does not relate significant weight changes, denies sob, DOE, orthopnea, PNDs, palpitations or chest pain.  CHF remains stable.  No complications form the medications being used. Patient has chronic lower extremity swelling.  HTN: Pt 's HTN remains stable.  Denies CP, sob, DOE, headaches, dizziness or visual disturbances.  No complications from the medications currently being used.  Last BP : 169/79, 159/75.  PAST MEDICAL HISTORY : Reviewed.  No changes.  CURRENT MEDICATIONS: Reviewed per [facility]  REVIEW OF SYSTEMS:  GENERAL: no change in appetite, no fatigue, no weight changes, no fever, chills or weakness RESPIRATORY: no cough, SOB, DOE, wheezing, hemoptysis CARDIAC: no chest pain, or palpitations, complains of chronic lower extremity swelling GI: no abdominal pain, diarrhea, constipation, heart burn, nausea or vomiting  PHYSICAL EXAMINATION  VS:  T 97.7      P 70     RR 18     BP 159/75     POX %     WT (Lb) 139  GENERAL: no acute distress, normal body habitus EYES: conjunctivae normal, sclerae normal, normal eye lids NECK: supple, trachea midline, no neck masses, no thyroid tenderness, no thyromegaly LYMPHATICS: no LAN in the neck, no supraclavicular LAN RESPIRATORY: breathing is even & unlabored, BS CTAB CARDIAC: RRR, no murmur,no extra heart sounds, +2 bilateral lower extremity edema L>R (chronic) GI: abdomen soft, normal BS, no masses, no tenderness, no hepatomegaly, no splenomegaly PSYCHIATRIC: the patient is alert &  oriented to person, affect & behavior appropriate  LABS/RADIOLOGY:  9-14 hemoglobin 9.8, MCV 89.5 otherwise CBC normal, albumin 3.2 otherwise CMP normal, BNP 304.5  ASSESSMENT/PLAN:  Hypothyroidism-new problem. Recheck TSH in 6 weeks is pending. CHF-well compensated Hypertension-blood pressure elevated. Increase metoprolol to 50 mg bid. CAD-stable Anemia of chronic disease-continue iron. Constipation-well-controlled. Left lower extremity DVT-on xarelto.  CPT CODE: 16109

## 2013-02-09 NOTE — Progress Notes (Signed)
Patient ID: Danielle [name], female   DOB: 1916/08/22, 79 y.o.   MRN: [id number]  [facility]  Allergies  Allergen Reactions  . Keflex [Cephalexin] Itching  . Penicillins Itching    Chief Complaint  Patient presents with  . Medical Managment of Chronic Issues    HPI She is being seen for the management of her chronic illnesses. She has been gaining weight in the past week she has gained 7 pounds. She does have chf; will need to adjust her medications in order to avoid any crisis situation. She is not voicing any complaints at this time. She is denying any chest pain or shortness of breath; she does have edema.   Past Medical History  Diagnosis Date  . Coronary artery disease   . CHF (congestive heart failure)   . Hypertension   . Anemia     acute blood loss from hip surgery 11/2012   Past Surgical History  Procedure Laterality Date  . Femur im nail Left 11/09/2012    Procedure: INTRAMEDULLARY (IM) NAIL FEMORAL/Left;  Surgeon: Toni Arthurs, MD;  Location: [facility] OR;  Service: Orthopedics;  Laterality: Left;    Filed Vitals:   12/14/12 1512  BP: 112/58  Pulse: 72  Height: 5\' 2"  (1.575 m)  Weight: 141 lb (63.957 kg)    MEDICATIONS  xarelto 20 mg daily Asa 325 mg daily Lasix 20 mg dailyvicodin 5.325 mg every 6 hours as needed mvi daily Vit c daiy azor 5.20 mg daily Catapres 0.2 mg nightly Colace twice daily Lopressor 25 mg twice daily Iron twice daily  LABS REVIEWED:  12-08-12; wbc 5.5; hgb 9.8; hct 29.1; mcv 89.5 plt 313; glucose 97; bun 21; creat 1.00; k+4.4;na++ 131; liver normal albumin 3.2    Review of Systems  Constitutional: Negative for malaise/fatigue.  Respiratory: Negative for cough, shortness of breath and wheezing.   Cardiovascular: Positive for leg swelling. Negative for chest pain and palpitations.  Gastrointestinal: Negative for heartburn, abdominal pain and constipation.  Musculoskeletal: Negative for joint pain and myalgias.  Skin: Negative.    Neurological: Negative for headaches.  Psychiatric/Behavioral: Negative for depression. The patient does not have insomnia.       Physical Exam  Constitutional:  thin  Neck: Neck supple. No JVD present.  Cardiovascular: Normal rate, regular rhythm and intact distal pulses.   Respiratory: Effort normal and breath sounds normal. No respiratory distress. She has no wheezes.  GI: Soft. Bowel sounds are normal. She exhibits no distension. There is no tenderness.  Musculoskeletal: She exhibits edema.  Has generalized weakness present  4+ edema on right and 2+ on the left   Neurological: She is alert.  Skin: Skin is warm.     ASSESSMENT/PLAN  1. Chf: is worse; will increase lasix to 20 mg twice daily and will check bmp in one week will continue current plan and will monitor her status   2. Hypertension: will continue azor 5/20 mg daily; catapres 0.2 mg nightly; lopressor 25 mg twice daily and will monitor   3. Anemia: will continue iron twice daily  4.; dvt: will continue asa and xarelto 20 mg daily and will monitor

## 2013-03-31 ENCOUNTER — Encounter (HOSPITAL_COMMUNITY): Payer: Self-pay | Admitting: Emergency Medicine

## 2013-03-31 ENCOUNTER — Observation Stay (HOSPITAL_COMMUNITY)
Admission: EM | Admit: 2013-03-31 | Discharge: 2013-04-06 | Disposition: A | Discharge status: Skilled Nursing Facility | Payer: Medicare Other | Attending: Cardiology | Admitting: Cardiology

## 2013-03-31 DIAGNOSIS — R32 Unspecified urinary incontinence: Secondary | ICD-10-CM | POA: Insufficient documentation

## 2013-03-31 DIAGNOSIS — I5022 Chronic systolic (congestive) heart failure: Secondary | ICD-10-CM | POA: Diagnosis present

## 2013-03-31 DIAGNOSIS — R627 Adult failure to thrive: Principal | ICD-10-CM | POA: Insufficient documentation

## 2013-03-31 DIAGNOSIS — I2510 Atherosclerotic heart disease of native coronary artery without angina pectoris: Secondary | ICD-10-CM | POA: Insufficient documentation

## 2013-03-31 DIAGNOSIS — S42409A Unspecified fracture of lower end of unspecified humerus, initial encounter for closed fracture: Secondary | ICD-10-CM

## 2013-03-31 DIAGNOSIS — R4182 Altered mental status, unspecified: Secondary | ICD-10-CM

## 2013-03-31 DIAGNOSIS — Z79899 Other long term (current) drug therapy: Secondary | ICD-10-CM

## 2013-03-31 DIAGNOSIS — D649 Anemia, unspecified: Secondary | ICD-10-CM | POA: Insufficient documentation

## 2013-03-31 DIAGNOSIS — R079 Chest pain, unspecified: Secondary | ICD-10-CM | POA: Insufficient documentation

## 2013-03-31 DIAGNOSIS — R748 Abnormal levels of other serum enzymes: Secondary | ICD-10-CM | POA: Insufficient documentation

## 2013-03-31 DIAGNOSIS — I10 Essential (primary) hypertension: Secondary | ICD-10-CM

## 2013-03-31 DIAGNOSIS — I509 Heart failure, unspecified: Secondary | ICD-10-CM

## 2013-03-31 DIAGNOSIS — I447 Left bundle-branch block, unspecified: Secondary | ICD-10-CM | POA: Insufficient documentation

## 2013-03-31 DIAGNOSIS — Z66 Do not resuscitate: Secondary | ICD-10-CM | POA: Diagnosis present

## 2013-03-31 DIAGNOSIS — IMO0002 Reserved for concepts with insufficient information to code with codable children: Secondary | ICD-10-CM

## 2013-03-31 DIAGNOSIS — E039 Hypothyroidism, unspecified: Secondary | ICD-10-CM | POA: Diagnosis present

## 2013-03-31 HISTORY — DX: Unspecified fracture of lower end of unspecified humerus, initial encounter for closed fracture: S42.409A

## 2013-03-31 LAB — COMPREHENSIVE METABOLIC PANEL
ALT: 23 U/L (ref 0–35)
AST: 52 U/L — ABNORMAL HIGH (ref 0–37)
Albumin: 2.9 g/dL — ABNORMAL LOW (ref 3.5–5.2)
Alkaline Phosphatase: 90 U/L (ref 39–117)
BUN: 29 mg/dL — ABNORMAL HIGH (ref 6–23)
CO2: 26 mEq/L (ref 19–32)
Chloride: 105 mEq/L (ref 96–112)
Potassium: 3.6 mEq/L (ref 3.5–5.1)
Sodium: 144 mEq/L (ref 135–145)
Total Bilirubin: 0.9 mg/dL (ref 0.3–1.2)
Total Protein: 6.3 g/dL (ref 6.0–8.3)

## 2013-03-31 LAB — URINALYSIS, ROUTINE W REFLEX MICROSCOPIC
Ketones, ur: 15 mg/dL — AB
Leukocytes, UA: NEGATIVE
Nitrite: NEGATIVE
Protein, ur: 30 mg/dL — AB
Urobilinogen, UA: 0.2 mg/dL (ref 0.0–1.0)
pH: 5 (ref 5.0–8.0)

## 2013-03-31 LAB — CBC WITH DIFFERENTIAL/PLATELET
Basophils Absolute: 0 10*3/uL (ref 0.0–0.1)
Basophils Relative: 0 % (ref 0–1)
Hemoglobin: 11.3 g/dL — ABNORMAL LOW (ref 12.0–15.0)
MCHC: 33.5 g/dL (ref 30.0–36.0)
Monocytes Relative: 12 % (ref 3–12)
Neutro Abs: 7 10*3/uL (ref 1.7–7.7)
Neutrophils Relative %: 75 % (ref 43–77)
Platelets: 189 10*3/uL (ref 150–400)
RDW: 15.3 % (ref 11.5–15.5)

## 2013-03-31 LAB — POCT I-STAT TROPONIN I

## 2013-03-31 LAB — URINE MICROSCOPIC-ADD ON

## 2013-03-31 MED ORDER — IRBESARTAN 150 MG PO TABS
150.0000 mg | ORAL_TABLET | Freq: Every day | ORAL | Status: DC
  Administered 2013-04-01 – 2013-04-06 (×6): 150 mg via ORAL
  Filled 2013-03-31 (×6): qty 1

## 2013-03-31 MED ORDER — RIVAROXABAN 20 MG PO TABS
20.0000 mg | ORAL_TABLET | Freq: Every day | ORAL | Status: DC
  Administered 2013-04-01 – 2013-04-02 (×2): 20 mg via ORAL
  Filled 2013-03-31 (×3): qty 1

## 2013-03-31 MED ORDER — ISOSORBIDE MONONITRATE ER 30 MG PO TB24
30.0000 mg | ORAL_TABLET | Freq: Every day | ORAL | Status: DC
  Administered 2013-04-01: 30 mg via ORAL
  Filled 2013-03-31: qty 1

## 2013-03-31 MED ORDER — SODIUM CHLORIDE 0.9 % IV BOLUS (SEPSIS)
500.0000 mL | Freq: Once | INTRAVENOUS | Status: AC
  Administered 2013-03-31: 500 mL via INTRAVENOUS

## 2013-03-31 MED ORDER — SODIUM CHLORIDE 0.9 % IV BOLUS (SEPSIS): 1000.0000 mL | Freq: Once | INTRAVENOUS | Status: DC

## 2013-03-31 MED ORDER — AMLODIPINE-OLMESARTAN 5-20 MG PO TABS: 1.0000 | ORAL_TABLET | Freq: Every day | ORAL | Status: DC

## 2013-03-31 MED ORDER — FUROSEMIDE 20 MG PO TABS
20.0000 mg | ORAL_TABLET | Freq: Two times a day (BID) | ORAL | Status: DC
  Administered 2013-04-01 – 2013-04-06 (×11): 20 mg via ORAL
  Filled 2013-03-31 (×13): qty 1

## 2013-03-31 MED ORDER — ZOLPIDEM TARTRATE 5 MG PO TABS
5.0000 mg | ORAL_TABLET | Freq: Every evening | ORAL | Status: DC | PRN
  Filled 2013-03-31: qty 1

## 2013-03-31 MED ORDER — ASPIRIN EC 81 MG PO TBEC
81.0000 mg | DELAYED_RELEASE_TABLET | Freq: Every day | ORAL | Status: DC
  Administered 2013-04-01 – 2013-04-06 (×6): 81 mg via ORAL
  Filled 2013-03-31 (×6): qty 1

## 2013-03-31 MED ORDER — CLONIDINE HCL 0.1 MG PO TABS
0.1000 mg | ORAL_TABLET | Freq: Every day | ORAL | Status: DC
  Administered 2013-04-01: 0.1 mg via ORAL
  Filled 2013-03-31: qty 1

## 2013-03-31 MED ORDER — AMLODIPINE BESYLATE 5 MG PO TABS
5.0000 mg | ORAL_TABLET | Freq: Every day | ORAL | Status: DC
  Administered 2013-04-01 – 2013-04-06 (×6): 5 mg via ORAL
  Filled 2013-03-31 (×6): qty 1

## 2013-03-31 MED ORDER — KETOROLAC TROMETHAMINE 0.5 % OP SOLN
1.0000 [drp] | Freq: Four times a day (QID) | OPHTHALMIC | Status: DC
  Administered 2013-04-01 – 2013-04-06 (×22): 1 [drp] via OPHTHALMIC
  Filled 2013-03-31: qty 3

## 2013-03-31 MED ORDER — COLLAGENASE 250 UNIT/GM EX OINT
1.0000 "application " | TOPICAL_OINTMENT | Freq: Every day | CUTANEOUS | Status: DC
  Administered 2013-04-01 – 2013-04-06 (×4): 1 via TOPICAL
  Filled 2013-03-31 (×2): qty 30

## 2013-03-31 MED ORDER — FERROUS SULFATE 325 (65 FE) MG PO TABS
325.0000 mg | ORAL_TABLET | Freq: Two times a day (BID) | ORAL | Status: DC
  Administered 2013-04-01 – 2013-04-06 (×12): 325 mg via ORAL
  Filled 2013-03-31 (×13): qty 1

## 2013-03-31 MED ORDER — METOPROLOL TARTRATE 25 MG PO TABS
25.0000 mg | ORAL_TABLET | Freq: Two times a day (BID) | ORAL | Status: DC
  Administered 2013-04-01 (×2): 25 mg via ORAL
  Filled 2013-03-31 (×3): qty 1

## 2013-03-31 NOTE — ED Notes (Addendum)
C/o last BM 3 days ago. Denies n/v/d, dysuria. Pt appears to have generalized pain & cries out when touched everywhere. Pt also requested something to eat upon arrival to ED.  Pt poor historian, awaiting family

## 2013-03-31 NOTE — ED Provider Notes (Signed)
CSN: [id number]     Arrival date & time 03/31/13  1555 History   First MD Initiated Contact with Patient 03/31/13 1809     Chief Complaint  Patient presents with  . Urinary Incontinence  . Abdominal Pain   (Consider location/radiation/quality/duration/timing/severity/associated sxs/prior Treatment) HPI 79 year old female with multiple medical problems presents today with epigastric pain, abdominal pain, increased confusion, increased urinary incontinence, per the family the patient has been acting out. She's been acting alternatives and difficult to deal with as she complains of no chest pain and abdominal pain. Patient does live by herself and is taking care of by her daughter who is her neighbor. Per the daughter however she is now taking all her medications as the daughter is also dealing with her medical problems and is unable to adequately keep up with the patient's medications. Family states that they're concerned that they're unable to care for the patient at this time.  Past Medical History  Diagnosis Date  . Coronary artery disease   . CHF (congestive heart failure)   . Hypertension   . Anemia     acute blood loss from hip surgery 11/2012   Past Surgical History  Procedure Laterality Date  . Femur im nail Left 11/09/2012    Procedure: INTRAMEDULLARY (IM) NAIL FEMORAL/Left;  Surgeon: Toni Arthurs, MD;  Location: [facility] OR;  Service: Orthopedics;  Laterality: Left;   History reviewed. No pertinent family history. History  Substance Use Topics  . Smoking status: Never Smoker   . Smokeless tobacco: Never Used  . Alcohol Use: No   OB History   Grav Para Term Preterm Abortions TAB SAB Ect Mult Living                 Review of Systems  Unable to perform ROS: Dementia    Allergies  Keflex and Penicillins  Home Medications   Current Outpatient Rx  Name  Route  Sig  Dispense  Refill  . amLODipine-olmesartan (AZOR) 5-20 MG per tablet   Oral   Take 1 tablet by mouth daily.          Marland Kitchen aspirin EC 81 MG tablet   Oral   Take 81 mg by mouth daily.         . cloNIDine (CATAPRES) 0.1 MG tablet   Oral   Take 0.1 mg by mouth daily.         . cloNIDine (CATAPRES) 0.2 MG tablet   Oral   Take 0.4 mg by mouth at bedtime.         . collagenase (SANTYL) ointment   Topical   Apply 1 application topically daily.         . Ferrous Sulfate (IRON) 325 (65 FE) MG TABS   Oral   Take 1 tablet by mouth 2 (two) times daily.   60 each   1   . furosemide (LASIX) 20 MG tablet   Oral   Take 20 mg by mouth 2 (two) times daily.          Marland Kitchen HYDROcodone-acetaminophen (NORCO/VICODIN) 5-325 MG per tablet   Oral   Take 1 tablet by mouth every 6 (six) hours as needed for moderate pain.         . isosorbide mononitrate (IMDUR) 30 MG 24 hr tablet   Oral   Take 30 mg by mouth daily.         Marland Kitchen ketorolac (ACULAR) 0.5 % ophthalmic solution   Right Eye   Place 1 drop  into the right eye 4 (four) times daily.         . metoprolol tartrate (LOPRESSOR) 25 MG tablet   Oral   Take 25 mg by mouth 2 (two) times daily.         . Multiple Vitamins-Minerals (MULTIVITAMIN WITH MINERALS) tablet   Oral   Take 1 tablet by mouth daily.         . nitroGLYCERIN (NITROSTAT) 0.4 MG SL tablet   Sublingual   Place 0.4 mg under the tongue every 5 (five) minutes as needed for chest pain.         . Rivaroxaban (XARELTO) 20 MG TABS tablet   Oral   Take 1 tablet (20 mg total) by mouth daily.   30 tablet   0   . traMADol (ULTRAM) 50 MG tablet   Oral   Take 50 mg by mouth every 6 (six) hours as needed for moderate pain.         . vitamin C (ASCORBIC ACID) 500 MG tablet   Oral   Take 500 mg by mouth daily.         Marland Kitchen zolpidem (AMBIEN) 5 MG tablet   Oral   Take 5 mg by mouth at bedtime as needed for sleep.          BP 158/83  Pulse 43  Temp(Src) 99.2 F (37.3 C) (Oral)  Resp 11  SpO2 96% Physical Exam  Constitutional: She is oriented to person, place, and  time. She appears cachectic. She appears ill.  HENT:  Head: Normocephalic and atraumatic.  Eyes: Pupils are equal, round, and reactive to light. Right eye exhibits no discharge. Left eye exhibits no discharge.  Neck: Normal range of motion.  Cardiovascular: Regular rhythm and normal heart sounds.  Tachycardia present.   Pulmonary/Chest: Effort normal and breath sounds normal.  Abdominal: Soft. She exhibits no distension. There is no tenderness.  Musculoskeletal: Normal range of motion.  Neurological: She is alert and oriented to person, place, and time.  Skin: Skin is warm. She is not diaphoretic.    ED Course  Procedures (including critical care time) Labs Review Labs Reviewed  URINALYSIS, ROUTINE W REFLEX MICROSCOPIC - Abnormal; Notable for the following:    Color, Urine AMBER (*)    APPearance CLOUDY (*)    Specific Gravity, Urine 1.031 (*)    Hgb urine dipstick MODERATE (*)    Bilirubin Urine MODERATE (*)    Ketones, ur 15 (*)    Protein, ur 30 (*)    All other components within normal limits  CBC WITH DIFFERENTIAL - Abnormal; Notable for the following:    RBC 3.67 (*)    Hemoglobin 11.3 (*)    HCT 33.7 (*)    Monocytes Absolute 1.1 (*)    All other components within normal limits  COMPREHENSIVE METABOLIC PANEL - Abnormal; Notable for the following:    BUN 29 (*)    Albumin 2.9 (*)    AST 52 (*)    GFR calc non Af Amer 50 (*)    GFR calc Af Amer 58 (*)    All other components within normal limits  URINE MICROSCOPIC-ADD ON - Abnormal; Notable for the following:    Squamous Epithelial / LPF MANY (*)    Casts HYALINE CASTS (*)    All other components within normal limits  POCT I-STAT TROPONIN I - Abnormal; Notable for the following:    Troponin i, poc 0.12 (*)    All other  components within normal limits  LIPASE, BLOOD  TROPONIN I   Imaging Review No results found.  EKG Interpretation    Date/Time:  Wednesday March 31 2013 16:07:44 EST Ventricular Rate:   106 PR Interval:    QRS Duration: 141 QT Interval:  417 QTC Calculation: 554 R Axis:   -67 Text Interpretation:  Normal sinus rhythm Premature atrial complexes Left bundle branch block When compared with ECG of 11/12/2012, No significant change was found Confirmed by [facility]  MD, DAVID ([id number]) on 03/31/2013 4:59:07 PM            MDM   1. Altered mental status   2. Congestive heart failure, unspecified   3. DNR (do not resuscitate)   4. Encounter for long-term (current) use of other medications   5. Failure to thrive   6. Hypertension    79 year old female with a history of coronary artery disease, CHF, hypertension, anemia who presents today for worsening urinary incontinence and inability of the family to continue taking care of her.  Physical exam as above reveals a elderly sickly-appearing female but is unable to ambulate. Patient has a mild confusion upon my exam. Otherwise physical exam is unremarkable. The symptoms are as above demonstrates no acute findings. There is no infectious source possibly causing the patient's worsening altered mental status. However given the patient's recent history of worsening urinary incontinence and inability to care for herself and inability of the family to care for her, the hospitalist will be consulted for a social admit for coordination with a skilled nursing facility.  Hospitalist was consulted and agreed to admit the patient and have social work following to coordinate placement. Patient admitted in stable condition. Patient seen and evaluated by myself and by my attending Dr. Preston Fleeting.    Imagene Sheller, MD 04/01/13 [phone]

## 2013-03-31 NOTE — ED Notes (Signed)
Presents with epigastric pain, abdominal pain, increased confusion, strong foul urine odor, elevated HR 114. Lives at home with Family. Pt will yell out in pain, alert to self, has not taken medications for three days. Denies nausea, vomting. Reports constipation.

## 2013-03-31 NOTE — ED Notes (Signed)
Family reports pt unable to take care of herself, has not been taking her meds "for a while". Daughter states she is too sick to care for her. Pt  NAD. VSS.  Family voicing that pt needs to be in a nrsg home

## 2013-03-31 NOTE — ED Provider Notes (Signed)
Patient is brought in by family who state that she is having progressive difficulty with urinary incontinence and ambulation at home. She has been having difficulty since being discharged from a rehabilitation center at 2 months ago. There's been no fever or chills. Appetite has been diminished and she is having difficulty managing herself at home. Family is concerned that is not a safe place for her. There's been no real acute change. On exam, as she is oriented to person and place but not time. Although she knows it is Christmas, she thinks the year is 6. Lungs are clear heart has regular rate and rhythm. Abdomen is soft and nontender. Neurologic exam is nonfocal with the only significant findings being her disorientation. Social service consultation will need to be obtained to arrange appropriate placement.  I saw and evaluated the patient, reviewed the resident's note and I agree with the findings and plan.  EKG Interpretation    Date/Time:  Wednesday March 31 2013 16:07:44 EST Ventricular Rate:  106 PR Interval:    QRS Duration: 141 QT Interval:  417 QTC Calculation: 554 R Axis:   -67 Text Interpretation:  Normal sinus rhythm Premature atrial complexes Left bundle branch block When compared with ECG of 11/12/2012, No significant change was found Confirmed by Preston Fleeting  MD, Ryenne Lynam ([id number]) on 03/31/2013 4:59:07 PM              Dione Booze, MD 03/31/13 2202

## 2013-03-31 NOTE — ED Notes (Signed)
Assisted Herbert Seta, EMT and [place], RN with in and out cath

## 2013-03-31 NOTE — ED Notes (Signed)
[name] daughter [phone] Hyman Bible Delfin Edis daughter [phone]

## 2013-04-01 DIAGNOSIS — R4182 Altered mental status, unspecified: Secondary | ICD-10-CM

## 2013-04-01 LAB — TROPONIN I
Troponin I: <0.3 ng/mL (ref <0.30)
Troponin I: <0.3 ng/mL (ref <0.30)
Troponin I: <0.3 ng/mL (ref <0.30)

## 2013-04-01 MED ORDER — ISOSORBIDE MONONITRATE ER 60 MG PO TB24
60.0000 mg | ORAL_TABLET | Freq: Every day | ORAL | Status: DC
  Administered 2013-04-02: 60 mg via ORAL
  Filled 2013-04-01 (×2): qty 1

## 2013-04-01 MED ORDER — NITROGLYCERIN 0.4 MG SL SUBL: 0.4000 mg | SUBLINGUAL_TABLET | SUBLINGUAL | Status: DC | PRN

## 2013-04-01 MED ORDER — METOPROLOL TARTRATE 25 MG PO TABS
25.0000 mg | ORAL_TABLET | Freq: Three times a day (TID) | ORAL | Status: DC
  Administered 2013-04-01 – 2013-04-06 (×15): 25 mg via ORAL
  Filled 2013-04-01 (×17): qty 1

## 2013-04-01 MED ORDER — DOCUSATE SODIUM 100 MG PO CAPS
100.0000 mg | ORAL_CAPSULE | Freq: Two times a day (BID) | ORAL | Status: DC
  Administered 2013-04-01 – 2013-04-03 (×5): 100 mg via ORAL
  Filled 2013-04-01 (×7): qty 1

## 2013-04-01 MED ORDER — HYDRALAZINE HCL 20 MG/ML IJ SOLN: 10.0000 mg | Freq: Three times a day (TID) | INTRAMUSCULAR | Status: DC | PRN

## 2013-04-01 NOTE — H&P (Addendum)
[facility] History and Physical  Danielle [name] ZOX:[id number] DOB: 1916/07/26    PCP:   Pola Corn, MD   Chief Complaint: failure to thrive.  HPI: Danielle [name] is an 79 y.o. female with multiple medical problems including CAD, CHF, anemia, HTN, deconditioned, urinary incontinence, brought to the ER as her daughter feels she can no longer be able to do her ADLs, and not safe to be at home alone.  She is alert and orient, and was agreeable to see social service for placement.  She has no chest pain, SOB, fever, chills, chest pain, shortness of breath, abdominal pain or cramps.  Evalaution in the ER included a normal WBC, HB, renal fx tests, LFTs and unremarkble UA.  Hospitalist was asked to admit her for failure to thrive, and for SNF placement.  Rewiew of Systems:  Constitutional: Negative for malaise, fever and chills. No significant weight loss or weight gain Eyes: Negative for eye pain, redness and discharge, diplopia, visual changes, or flashes of light. ENMT: Negative for ear pain, hoarseness, nasal congestion, sinus pressure and sore throat. No headaches; tinnitus, drooling, or problem swallowing. Cardiovascular: Negative for chest pain, palpitations, diaphoresis, dyspnea and peripheral edema. ; No orthopnea, PND Respiratory: Negative for cough, hemoptysis, wheezing and stridor. No pleuritic chestpain. Gastrointestinal: Negative for nausea, vomiting, diarrhea, constipation, abdominal pain, melena, blood in stool, hematemesis, jaundice and rectal bleeding.    Genitourinary: Negative for frequency, dysuria, incontinence,flank pain and hematuria; Musculoskeletal: Negative for back pain and neck pain. Negative for swelling and trauma.;  Skin: . Negative for pruritus, rash, abrasions, bruising and skin lesion.; ulcerations Neuro: Negative for headache, lightheadedness and neck stiffness. Negative for weakness, altered level of consciousness , altered mental status, extremity  weakness, burning feet, involuntary movement, seizure and syncope.  Psych: negative for anxiety, depression, insomnia, tearfulness, panic attacks, hallucinations, paranoia, suicidal or homicidal ideation    Past Medical History  Diagnosis Date  . Coronary artery disease   . CHF (congestive heart failure)   . Hypertension   . Anemia     acute blood loss from hip surgery 11/2012    Past Surgical History  Procedure Laterality Date  . Femur im nail Left 11/09/2012    Procedure: INTRAMEDULLARY (IM) NAIL FEMORAL/Left;  Surgeon: Toni Arthurs, MD;  Location: [facility] OR;  Service: Orthopedics;  Laterality: Left;    Medications:  HOME MEDS: Prior to Admission medications   Medication Sig Start Date End Date Taking? Authorizing Provider  amLODipine-olmesartan (AZOR) 5-20 MG per tablet Take 1 tablet by mouth daily.   Yes Historical Provider, MD  aspirin EC 81 MG tablet Take 81 mg by mouth daily.   Yes Historical Provider, MD  cloNIDine (CATAPRES) 0.1 MG tablet Take 0.1 mg by mouth daily.   Yes Historical Provider, MD  cloNIDine (CATAPRES) 0.2 MG tablet Take 0.4 mg by mouth at bedtime.   Yes Historical Provider, MD  collagenase (SANTYL) ointment Apply 1 application topically daily.   Yes Historical Provider, MD  Ferrous Sulfate (IRON) 325 (65 FE) MG TABS Take 1 tablet by mouth 2 (two) times daily. 11/16/12  Yes Ricki Rodriguez, MD  furosemide (LASIX) 20 MG tablet Take 20 mg by mouth 2 (two) times daily.    Yes Historical Provider, MD  HYDROcodone-acetaminophen (NORCO/VICODIN) 5-325 MG per tablet Take 1 tablet by mouth every 6 (six) hours as needed for moderate pain.   Yes Historical Provider, MD  isosorbide mononitrate (IMDUR) 30 MG 24 hr tablet Take 30 mg by  mouth daily.   Yes Historical Provider, MD  ketorolac (ACULAR) 0.5 % ophthalmic solution Place 1 drop into the right eye 4 (four) times daily.   Yes Historical Provider, MD  metoprolol tartrate (LOPRESSOR) 25 MG tablet Take 25 mg by mouth 2 (two)  times daily.   Yes Historical Provider, MD  Multiple Vitamins-Minerals (MULTIVITAMIN WITH MINERALS) tablet Take 1 tablet by mouth daily.   Yes Historical Provider, MD  nitroGLYCERIN (NITROSTAT) 0.4 MG SL tablet Place 0.4 mg under the tongue every 5 (five) minutes as needed for chest pain.   Yes Historical Provider, MD  Rivaroxaban (XARELTO) 20 MG TABS tablet Take 1 tablet (20 mg total) by mouth daily. 01/22/13  Yes Astrid Divine, NP  traMADol (ULTRAM) 50 MG tablet Take 50 mg by mouth every 6 (six) hours as needed for moderate pain.   Yes Historical Provider, MD  vitamin C (ASCORBIC ACID) 500 MG tablet Take 500 mg by mouth daily.   Yes Historical Provider, MD  zolpidem (AMBIEN) 5 MG tablet Take 5 mg by mouth at bedtime as needed for sleep.   Yes Historical Provider, MD     Allergies:  Allergies  Allergen Reactions  . Keflex [Cephalexin] Itching  . Penicillins Itching    Social History:   reports that she has never smoked. She has never used smokeless tobacco. She reports that she does not drink alcohol. Her drug history is not on file.  Family History: History reviewed. No pertinent family history.   Physical Exam: Filed Vitals:   03/31/13 2200 03/31/13 2230 03/31/13 2300 03/31/13 2344  BP: 163/99 167/74 169/119 167/95  Pulse: 111 104 110 110  Temp:    98.1 F (36.7 C)  TempSrc:    Oral  Resp: 22 21 18 18   Height:    5\' 4"  (1.626 m)  Weight:    55.974 kg (123 lb 6.4 oz)  SpO2: 99% 98% 98% 91%   Blood pressure 167/95, pulse 110, temperature 98.1 F (36.7 C), temperature source Oral, resp. rate 18, height 5\' 4"  (1.626 m), weight 55.974 kg (123 lb 6.4 oz), SpO2 91.00%.  GEN:  Pleasant  patient lying in the stretcher in no acute distress; cooperative with exam. PSYCH:  alert and oriented x4; does not appear anxious or depressed; affect is appropriate. HEENT: Mucous membranes pink and anicteric; PERRLA; EOM intact; no cervical lymphadenopathy nor thyromegaly or carotid bruit;  no JVD; There were no stridor. Neck is very supple. Breasts:: Not examined CHEST WALL: No tenderness CHEST: Normal respiration, clear to auscultation bilaterally.  HEART: Regular rate and rhythm.  There are no murmur, rub, or gallops.   BACK: No kyphosis or scoliosis; no CVA tenderness ABDOMEN: soft and non-tender; no masses, no organomegaly, normal abdominal bowel sounds; no pannus; no intertriginous candida. There is no rebound and no distention. Rectal Exam: Not done EXTREMITIES: No bone or joint deformity; age-appropriate arthropathy of the hands and knees; no edema; no ulcerations.  There is no calf tenderness. Genitalia: not examined PULSES: 2+ and symmetric SKIN: Normal hydration no rash or ulceration CNS: Cranial nerves 2-12 grossly intact no focal lateralizing neurologic deficit.  Speech is fluent; uvula elevated with phonation, facial symmetry and tongue midline. DTR are normal bilaterally, cerebella exam is intact, barbinski is negative and strengths are equaled bilaterally.  No sensory loss.   Labs on Admission:  Basic Metabolic Panel:  Recent Labs Lab 03/31/13 1648  NA 144  K 3.6  CL 105  CO2 26  GLUCOSE 84  BUN 29*  CREATININE 0.94  CALCIUM 8.7   Liver Function Tests:  Recent Labs Lab 03/31/13 1648  AST 52*  ALT 23  ALKPHOS 90  BILITOT 0.9  PROT 6.3  ALBUMIN 2.9*    Recent Labs Lab 03/31/13 1648  LIPASE 18   No results found for this basename: AMMONIA,  in the last 168 hours CBC:  Recent Labs Lab 03/31/13 1648  WBC 9.3  NEUTROABS 7.0  HGB 11.3*  HCT 33.7*  MCV 91.8  PLT 189   Cardiac Enzymes: No results found for this basename: CKTOTAL, CKMB, CKMBINDEX, TROPONINI,  in the last 168 hours  CBG: No results found for this basename: GLUCAP,  in the last 168 hours   Radiological Exams on Admission: No results found.  Assessment/Plan Present on Admission:  . Failure to thrive . Congestive heart failure, unspecified . Hypertension .  Unspecified hypothyroidism . DNR (do not resuscitate)  PLAN:  She will be admitted for generalized weakness, failure to thrive, unable to perform ADL, and unsafe to live alone.  I have consulted social service for placement.  I have continued her home meds.  She is on Xarelto for DVT, and this will be continued as well.   She is quite alert and conversing.  I confirmed that she is a DNR, and will honor her wishes.  Thank you for allowing me to participate in her care.   Other plans as per orders.  Code Status: DNR.   Houston Siren, MD. [facility] Pager [phone] 7pm to 7am.  04/01/2013, 2:20 AM

## 2013-04-01 NOTE — Progress Notes (Signed)
Patient admitted with Failure to thrive. Not feeling well. Patient report transient episode of chest pain this morning. Troponin mildly elevated at 0.12. Will continue to cycle troponin, repeat EKG. Patient is chest pain free. I spoke with Dr Algie Coffer patient is not candidate for intervention. I have consulted Dr Sharyn Lull who is covering for Dr Algie Coffer. He will see patient today in consultation. Continue with metoprolol, nitroglycerin PRN, Imdur. Order IV hydralazine for hypertension PRN. Patient on Xarelto for DVT.  Laporsche Hoeger, Md.

## 2013-04-01 NOTE — Consult Note (Signed)
Reason for Consult: Chest pain/minimally elevated troponin I Referring Physician: [facility] hospitalist  Danielle [name] is an 79 y.o. female.  HPI: Patient is 79 year old female with past medical history significant for moderate coronary artery disease, history of congestive heart failure secondary to preserved LV systolic function, hypertension, hypothyroidism, degenerative joint disease, history of recent hip fracture, anemia of chronic disease, was admitted yesterday because of failure to thrive and vague retrosternal chest pain off and on. Patient had EKG done which showed left bundle branch block and this morning was noted to have minimally elevated troponin I. Patient states her she had fall at home a week ago and complains of generalized back pain chest pain. States chest pain increases with movement. Patient presently is alert awake and oriented. Denies any shortness of breath. Denies any palpitation. Patient had cardiac cath her approximately 2-1/2 years ago which showed mild to moderate coronary artery disease with preserved LV systolic function. 2-D echo has been ordered which has not been done yet. Patient's activity is very limited mostly she is bedbound. As per daughter as she could not take care of her at home and was admitted to be transferred to skilled nursing facility.  Past Medical History  Diagnosis Date  . Coronary artery disease   . CHF (congestive heart failure)   . Hypertension   . Anemia     acute blood loss from hip surgery 11/2012    Past Surgical History  Procedure Laterality Date  . Femur im nail Left 11/09/2012    Procedure: INTRAMEDULLARY (IM) NAIL FEMORAL/Left;  Surgeon: Toni Arthurs, MD;  Location: [facility] OR;  Service: Orthopedics;  Laterality: Left;    History reviewed. No pertinent family history.  Social History:  reports that she has never smoked. She has never used smokeless tobacco. She reports that she does not drink alcohol. Her drug history is not on  file.  Allergies:  Allergies  Allergen Reactions  . Keflex [Cephalexin] Itching  . Penicillins Itching    Medications: I have reviewed the patient's current medications.  Results for orders placed during the hospital encounter of 03/31/13 (from the past 48 hour(s))  TROPONIN I     Status: None   Collection Time    03/31/13 12:33 AM      Result Value Range   Troponin I <0.30  <0.30 ng/mL   Comment:            Due to the release kinetics of cTnI,     a negative result within the first hours     of the onset of symptoms does not rule out     myocardial infarction with certainty.     If myocardial infarction is still suspected,     repeat the test at appropriate intervals.  CBC WITH DIFFERENTIAL     Status: Abnormal   Collection Time    03/31/13  4:48 PM      Result Value Range   WBC 9.3  4.0 - 10.5 K/uL   RBC 3.67 (*) 3.87 - 5.11 MIL/uL   Hemoglobin 11.3 (*) 12.0 - 15.0 g/dL   HCT 04.5 (*) 40.9 - 81.1 %   MCV 91.8  78.0 - 100.0 fL   MCH 30.8  26.0 - 34.0 pg   MCHC 33.5  30.0 - 36.0 g/dL   RDW 91.4  78.2 - 95.6 %   Platelets 189  150 - 400 K/uL   Neutrophils Relative % 75  43 - 77 %   Neutro Abs 7.0  1.7 - 7.7 K/uL   Lymphocytes Relative 13  12 - 46 %   Lymphs Abs 1.2  0.7 - 4.0 K/uL   Monocytes Relative 12  3 - 12 %   Monocytes Absolute 1.1 (*) 0.1 - 1.0 K/uL   Eosinophils Relative 0  0 - 5 %   Eosinophils Absolute 0.0  0.0 - 0.7 K/uL   Basophils Relative 0  0 - 1 %   Basophils Absolute 0.0  0.0 - 0.1 K/uL  COMPREHENSIVE METABOLIC PANEL     Status: Abnormal   Collection Time    03/31/13  4:48 PM      Result Value Range   Sodium 144  135 - 145 mEq/L   Potassium 3.6  3.5 - 5.1 mEq/L   Chloride 105  96 - 112 mEq/L   CO2 26  19 - 32 mEq/L   Glucose, Bld 84  70 - 99 mg/dL   BUN 29 (*) 6 - 23 mg/dL   Creatinine, Ser 1.61  0.50 - 1.10 mg/dL   Calcium 8.7  8.4 - 09.6 mg/dL   Total Protein 6.3  6.0 - 8.3 g/dL   Albumin 2.9 (*) 3.5 - 5.2 g/dL   AST 52 (*) 0 - 37 U/L    ALT 23  0 - 35 U/L   Alkaline Phosphatase 90  39 - 117 U/L   Total Bilirubin 0.9  0.3 - 1.2 mg/dL   GFR calc non Af Amer 50 (*) >90 mL/min   GFR calc Af Amer 58 (*) >90 mL/min   Comment: (NOTE)     The eGFR has been calculated using the CKD EPI equation.     This calculation has not been validated in all clinical situations.     eGFR's persistently <90 mL/min signify possible Chronic Kidney     Disease.  LIPASE, BLOOD     Status: None   Collection Time    03/31/13  4:48 PM      Result Value Range   Lipase 18  11 - 59 U/L  POCT I-STAT TROPONIN I     Status: Abnormal   Collection Time    03/31/13  5:07 PM      Result Value Range   Troponin i, poc 0.12 (*) 0.00 - 0.08 ng/mL   Comment NOTIFIED PHYSICIAN     Comment 3            Comment: Due to the release kinetics of cTnI,     a negative result within the first hours     of the onset of symptoms does not rule out     myocardial infarction with certainty.     If myocardial infarction is still suspected,     repeat the test at appropriate intervals.  URINALYSIS, ROUTINE W REFLEX MICROSCOPIC     Status: Abnormal   Collection Time    03/31/13  5:48 PM      Result Value Range   Color, Urine AMBER (*) YELLOW   Comment: BIOCHEMICALS MAY BE AFFECTED BY COLOR   APPearance CLOUDY (*) CLEAR   Specific Gravity, Urine 1.031 (*) 1.005 - 1.030   pH 5.0  5.0 - 8.0   Glucose, UA NEGATIVE  NEGATIVE mg/dL   Hgb urine dipstick MODERATE (*) NEGATIVE   Bilirubin Urine MODERATE (*) NEGATIVE   Ketones, ur 15 (*) NEGATIVE mg/dL   Protein, ur 30 (*) NEGATIVE mg/dL   Urobilinogen, UA 0.2  0.0 - 1.0 mg/dL   Nitrite NEGATIVE  NEGATIVE   Leukocytes, UA NEGATIVE  NEGATIVE  URINE MICROSCOPIC-ADD ON     Status: Abnormal   Collection Time    03/31/13  5:48 PM      Result Value Range   Squamous Epithelial / LPF MANY (*) RARE   WBC, UA 0-2  <3 WBC/hpf   RBC / HPF 3-6  <3 RBC/hpf   Casts HYALINE CASTS (*) NEGATIVE   Urine-Other MUCOUS PRESENT      No  results found.  Review of Systems  Constitutional: Negative for fever and chills.  Respiratory: Negative for cough and hemoptysis.   Cardiovascular: Positive for chest pain and leg swelling. Negative for palpitations and orthopnea.  Gastrointestinal: Negative for nausea, vomiting, abdominal pain and diarrhea.  Neurological: Negative for dizziness and headaches.   Blood pressure 155/81, pulse 94, temperature 98 F (36.7 C), temperature source Oral, resp. rate 20, height 5\' 4"  (1.626 m), weight 55.974 kg (123 lb 6.4 oz), SpO2 99.00%. Physical Exam  Constitutional: She is oriented to person, place, and time.  Eyes: Conjunctivae are normal. Left eye exhibits no discharge.  Neck: Normal range of motion. Neck supple. No JVD present. No tracheal deviation present. No thyromegaly present.  Cardiovascular: Normal rate and regular rhythm.   Murmur (Soft systolic and diastolic murmur noted) heard. Respiratory:  Decreased breath sound at bases  GI: Soft. Bowel sounds are normal. She exhibits no distension. There is no tenderness. There is no rebound.  Musculoskeletal:  No clubbing cyanosis 1+ edema noted  Neurological: She is alert and oriented to person, place, and time.    Assessment/Plan: Atypical chest pain with minimally elevated troponin I. rule out MI Mild decompensated congestive heart failure secondary to preserved LV systolic function Coronary artery disease Hypertension Hypothyroidism Degenerative joint disease Chronic anemia Failure to thrive Plan Check serial enzymes Check 2-D echo Medical Rx for now unless marked elevation of cardiac enzymes/significant wall motion abnormalities on 2-D echo  Daeron Carreno N 04/01/2013, 11:11 AM

## 2013-04-02 ENCOUNTER — Inpatient Hospital Stay (HOSPITAL_COMMUNITY): Payer: Medicare Other

## 2013-04-02 LAB — TROPONIN I
Troponin I: <0.3 ng/mL (ref <0.30)
Troponin I: <0.3 ng/mL (ref <0.30)
Troponin I: <0.3 ng/mL (ref <0.30)

## 2013-04-02 LAB — CBC
MCHC: 33.6 g/dL (ref 30.0–36.0)
RDW: 15.6 % — ABNORMAL HIGH (ref 11.5–15.5)
WBC: 9.2 10*3/uL (ref 4.0–10.5)

## 2013-04-02 LAB — BASIC METABOLIC PANEL
CO2: 28 mEq/L (ref 19–32)
Calcium: 7.8 mg/dL — ABNORMAL LOW (ref 8.4–10.5)
Chloride: 106 mEq/L (ref 96–112)
GFR calc Af Amer: 56 mL/min — ABNORMAL LOW (ref >90)
Glucose, Bld: 113 mg/dL — ABNORMAL HIGH (ref 70–99)
Sodium: 142 mEq/L (ref 135–145)

## 2013-04-02 MED ORDER — ACETAMINOPHEN 325 MG PO TABS
650.0000 mg | ORAL_TABLET | Freq: Four times a day (QID) | ORAL | Status: DC | PRN
  Administered 2013-04-02 – 2013-04-04 (×4): 650 mg via ORAL
  Filled 2013-04-02 (×4): qty 2

## 2013-04-02 MED ORDER — POTASSIUM CHLORIDE CRYS ER 20 MEQ PO TBCR
40.0000 meq | EXTENDED_RELEASE_TABLET | Freq: Once | ORAL | Status: AC
  Administered 2013-04-02: 40 meq via ORAL
  Filled 2013-04-02: qty 2

## 2013-04-02 NOTE — Consult Note (Signed)
.  WOC wound consult note Reason for Consult: Moisture Associated Skin Damage (MASD), specifically Incontinence Associated Dermatitis (IAD). Patieal thickness open areas are not pressure related. Patient complaining of burning sensation at left heel, no alteration in skin integrity noted. Two small open areas (sites of unroofed blisters) in periarea secondary to urinary incontinence experienced during fall. Wound type:MASD, specifically, IAD Pressure Ulcer POA: No Measurement: Two open areas, each less tan 1.5cm round on medial thighs Wound ONG:EXBMW, pink, moist Drainage (amount, consistency, odor) scant serous Periwound:macerated.  Bedside RN has just inserted indwelling urinary catheter. Dressing procedure/placement/frequency: I will provide bilateral Prevalon Boots as patient is experiencing discomfort.  (Heels are floated with pillows beneath calves, but patient is still complaining of burning sensation.) Additionally, I will provide a therapeutic sleep surface since patient also has a fractured arm and is reluctant to turn and reposition due to pain.  The two lesions resultant from IAD will be covered with a soft silicone foam dressing to enhance reepithelialization, and are only practical because of the indwelling urinary catheter.  Should the catheter be removed prior to the wounds resurfacing, please use zinc-based skin barrier cream to cover wounds until healed. WOC nursing team will not follow routinely, but will remain available to this patient, the nursing and medical team.  Please re-consult if needed. Thanks, Ladona Mow, MSN, RN, GNP, [place], CWON-AP [phone])

## 2013-04-02 NOTE — Progress Notes (Signed)
04/02/13 1054 nsg  pt had trouble swallowing pills in apple sauce claims she's choking but able to spit all meds. continous to gag suctioned patient non came out; no meds found in her mouth. Charge RN came to help. MD notified new orders noted.

## 2013-04-02 NOTE — Progress Notes (Signed)
PT Cancellation Note  Patient Details Name: Danielle [name] MRN: [id number] DOB: 1916-06-19   Cancelled Treatment:    Reason Eval/Treat Not Completed: Patient at procedure or test/unavailable. Pt off floor at procedure, will re-attempt at next available time.    Donnamarie Poag [place], [place] [phone] 04/02/2013, 12:17 PM

## 2013-04-02 NOTE — Progress Notes (Signed)
Subjective:  Patient denies any chest pain or shortness of breath. Complains of musculoskeletal pain in the shoulder is back and left elbow. States had fall recently  Objective:  Vital Signs in the last 24 hours: Temp:  [98 F (36.7 C)-99.1 F (37.3 C)] 99.1 F (37.3 C) (12/26 0915) Pulse Rate:  [85-102] 102 (12/26 0915) Resp:  [18-19] 19 (12/26 0915) BP: (113-161)/(54-91) 120/54 mmHg (12/26 0915) SpO2:  [95 %-99 %] 96 % (12/26 0915) Weight:  [58.605 kg (129 lb 3.2 oz)] 58.605 kg (129 lb 3.2 oz) (12/25 2025)  Intake/Output from previous day: 12/25 0701 - 12/26 0700 In: 100 [P.O.:100] Out: 2 [Urine:1; Stool:1] Intake/Output from this shift: Total I/O In: 120 [P.O.:120] Out: 1 [Urine:1]  Physical Exam: Neck: no adenopathy, no carotid bruit, no JVD and supple, symmetrical, trachea midline Lungs: Decreased breath sound at bases Heart: regular rate and rhythm, S1, S2 normal and Soft systolic and diastolic murmur noted Abdomen: soft, non-tender; bowel sounds normal; no masses,  no organomegaly Extremities: No clubbing cyanosis 1+ edema noted mild tenderness and decreased range of motion left elbow  Lab Results:  Recent Labs  03/31/13 1648 04/02/13 0449  WBC 9.3 9.2  HGB 11.3* 10.1*  PLT 189 153    Recent Labs  03/31/13 1648 04/02/13 0449  NA 144 142  K 3.6 3.4*  CL 105 106  CO2 26 28  GLUCOSE 84 113*  BUN 29* 26*  CREATININE 0.94 0.97    Recent Labs  04/01/13 2305 04/02/13 0449  TROPONINI <0.30 <0.30   Hepatic Function Panel  Recent Labs  03/31/13 1648  PROT 6.3  ALBUMIN 2.9*  AST 52*  ALT 23  ALKPHOS 90  BILITOT 0.9   No results found for this basename: CHOL,  in the last 72 hours No results found for this basename: PROTIME,  in the last 72 hours  Imaging: Imaging results have been reviewed and No results found.  Cardiac Studies:  Assessment/Plan:  Status post Atypical chest pain MI ruled out Mild decompensated congestive heart failure  secondary to preserved LV systolic function  Coronary artery disease  Hypertension  Hypothyroidism  Degenerative joint disease  Chronic anemia  Failure to thrive Musculoskeletal pain Plan Check x-ray left elbow OT PT consult Social service for discharge planning  LOS: 2 days    Emmer Lillibridge N 04/02/2013, 10:28 AM

## 2013-04-02 NOTE — Evaluation (Signed)
Physical Therapy Evaluation Patient Details Name: MARSHEA WISHER MRN: [id number] DOB: 1916/05/03 Today's Date: 04/02/2013 Time: 2440-1027 PT Time Calculation (min): 20 min  PT Assessment / Plan / Recommendation History of Present Illness    Patient is 79 year old female with past medical history significant for moderate coronary artery disease, history of congestive heart failure secondary to preserved LV systolic function, hypertension, hypothyroidism, degenerative joint disease, history of recent hip fracture, anemia of chronic disease, was admitted yesterday because of failure to thrive and vague retrosternal chest pain off and on. Patient had EKG done which showed left bundle branch block and this morning was noted to have minimally elevated troponin I. Patient states her she had fall at home a week ago and complains of generalized back pain chest pain. States chest pain increases with movement. Patient presently is alert awake and oriented. Denies any shortness of breath. Denies any palpitation. Patient had cardiac cath her approximately 2-1/2 years ago which showed mild to moderate coronary artery disease with preserved LV systolic function. 2-D echo has been ordered which has not been done yet. Patient's activity is very limited mostly she is bedbound. As per daughter as she could not take care of her at home and was admitted to be transferred to skilled nursing facility.   Clinical Impression  Pt adm due to the above. Presents with decreased functional independenece with mobility. Evaluation was limited due to increased pain in Lt UE. Encouraged bed mobility without WB through Lt UE until x-ray results were determined. Pt to benefit from skilled PT in acute setting to address deficits listed below (see PT problem list). Will benefit from SNF upon acute D/C to reduce caregiver burden and seek post acute rehab to return to PLOF. Pt has good support from daughter but due to limited mobility at this  time will need more extensive rehab post acute.     PT Assessment  Patient needs continued PT services    Follow Up Recommendations  SNF;Supervision/Assistance - 24 hour    Does the patient have the potential to tolerate intense rehabilitation      Barriers to Discharge Decreased caregiver support pt will require increased (A) at this time     Equipment Recommendations  Other (comment) (TBD)    Recommendations for Other Services OT consult   Frequency Min 2X/week    Precautions / Restrictions Precautions Precautions: Fall Precaution Comments: multiple recent falls per daughter Restrictions Weight Bearing Restrictions: No   Pertinent Vitals/Pain "10/10 all over"       Mobility  Bed Mobility Bed Mobility: Rolling Right;Rolling Left;Scooting to [facility] Rolling Right: 2: Max assist;With rail Rolling Left: 2: Max assist;With rail Scooting to HOB: 1: +2 Total assist;With rail Scooting to [facility]: Patient Percentage: 0% Details for Bed Mobility Assistance: encouraged pt to not WB or pull with Lt UE due to pain and x-rays being taken; required incr time for rolling and bed mobility; pt required 2+ (A) for pernieal hygiene and to change bedding; max cues for sequencing  Transfers Transfers: Not assessed (pt refused due to pain) Ambulation/Gait Ambulation/Gait Assistance: Not tested (comment) Stairs: No    Exercises General Exercises - Lower Extremity Ankle Circles/Pumps: AAROM;Both;10 reps;Supine;Other (comment) (limited by pain)   PT Diagnosis: Difficulty walking;Generalized weakness;Acute pain  PT Problem List: Decreased strength;Decreased range of motion;Decreased activity tolerance;Decreased balance;Decreased mobility;Decreased cognition;Decreased knowledge of use of DME;Decreased safety awareness;Pain PT Treatment Interventions: DME instruction;Gait training;Functional mobility training;Therapeutic activities;Therapeutic exercise;Neuromuscular re-education;Balance  training;Patient/family education     PT Goals(Current goals  can be found in the care plan section) Acute Rehab PT Goals Patient Stated Goal: "i am miserable. i dont want any of this pain"  PT Goal Formulation: With patient Time For Goal Achievement: 04/16/13 Potential to Achieve Goals: Fair  Visit Information  Last PT Received On: 04/02/13 Assistance Needed: +2 Reason Eval/Treat Not Completed: Patient at procedure or test/unavailable       Prior Functioning  Home Living Family/patient expects to be discharged to:: Skilled nursing facility Living Arrangements: Spouse/significant other Prior Function Level of Independence: Needs assistance Comments: pt is confused and disoritend; is a poor historian but reported she ambulated with rollator at home and daughter would (A) her with ADLs  Communication Communication: No difficulties Dominant Hand: Right    Cognition  Cognition Arousal/Alertness: Awake/alert Behavior During Therapy: Restless Overall Cognitive Status: Impaired/Different from baseline Area of Impairment: Orientation;Memory;Problem solving Orientation Level: Disoriented to;Place;Time;Situation Memory: Decreased short-term memory Problem Solving: Decreased initiation;Difficulty sequencing;Requires verbal cues;Requires tactile cues;Slow processing General Comments: pt continuously believed she was at home and required max redirection due to pain     Extremity/Trunk Assessment Upper Extremity Assessment Upper Extremity Assessment: Defer to OT evaluation (c/o pain in Lt UE; went down for x-ray earlier no results ) Lower Extremity Assessment Lower Extremity Assessment: Generalized weakness (limited by pain) Cervical / Trunk Assessment Cervical / Trunk Assessment: Kyphotic   Balance Balance Balance Assessed: No  End of Session PT - End of Session Activity Tolerance: Patient limited by pain;Patient limited by fatigue Patient left: in bed;with call bell/phone within  reach;with nursing/sitter in room Nurse Communication: Mobility status;Precautions  GP     Donell Sievert, [place] [phone] 04/02/2013, 3:09 PM

## 2013-04-02 NOTE — Progress Notes (Signed)
UR completed. Selvin Yun RN CCM Case Mgmt phone [phone] 

## 2013-04-02 NOTE — Progress Notes (Addendum)
Clinical Social Work Department CLINICAL SOCIAL WORK PLACEMENT NOTE 04/02/2013  Patient:  Danielle [name], Danielle [name]  Account Number:  [id number] Admit date:  03/31/2013  Clinical Social Worker:  Carren Rang  Date/time:  04/02/2013 02:11 PM  Clinical Social Work is seeking post-discharge placement for this patient at the following level of care:   SKILLED NURSING   (*CSW will update this form in Epic as items are completed)   04/02/2013  Patient/family provided with [facility] Department of Clinical Social Work's list of facilities offering this level of care within the geographic area requested by the patient (or if unable, by the patient's family).  04/02/2013  Patient/family informed of their freedom to choose among providers that offer the needed level of care, that participate in Medicare, Medicaid or managed care program needed by the patient, have an available bed and are willing to accept the patient.  04/02/2013  Patient/family informed of [facility]' ownership interest in [facility], as well as of the fact that they are under no obligation to receive care at this facility.  PASARR submitted to EDS on  PASARR number received from EDS on   FL2 transmitted to all facilities in geographic area requested by pt/family on  04/02/2013 FL2 transmitted to all facilities within larger geographic area on   Patient informed that his/her managed care company has contracts with or will negotiate with  certain facilities, including the following:     Patient/family informed of bed offers received:   Patient chooses bed at  Physician recommends and patient chooses bed at    Patient to be transferred to [facility] on 04/06/13- Jetta Lout, LCSWA   Patient to be transferred to facility by [facility] (non-emergency EMS)- Jetta Lout, LCSWA   The following physician request were entered in Epic:   Additional Comments: Patient had existing Pasarr    Maree Krabbe, MSW,  Theresia Majors [phone]

## 2013-04-02 NOTE — Evaluation (Signed)
Clinical/Bedside Swallow Evaluation Patient Details  Name: Danielle [name] MRN: [id number] Date of Birth: 07/02/1916  Today's Date: 04/02/2013 Time: 1450-1511 SLP Time Calculation (min): 21 min  Past Medical History:  Past Medical History  Diagnosis Date  . Coronary artery disease   . CHF (congestive heart failure)   . Hypertension   . Anemia     acute blood loss from hip surgery 11/2012   Past Surgical History:  Past Surgical History  Procedure Laterality Date  . Femur im nail Left 11/09/2012    Procedure: INTRAMEDULLARY (IM) NAIL FEMORAL/Left;  Surgeon: Toni Arthurs, MD;  Location: [facility] OR;  Service: Orthopedics;  Laterality: Left;   HPI:  Danielle [name] is an 79 y.o. female with multiple medical problems including CAD, CHF, anemia, HTN, deconditioned, urinary incontinence, brought to the ER as her daughter feels she can no longer be able to do her ADLs, and not safe to be at home alone.  She is alert and orient, and was agreeable to see social service for placement.  She has no chest pain, SOB, fever, chills, chest pain, shortness of breath, abdominal pain or cramps.  Evalaution in the ER included a normal WBC, HB, renal fx tests, LFTs and unremarkble UA.  Hospitalist was asked to admit her for failure to thrive, and for SNF placement. SLP spoke to dtr, she reports a history of dyshpagia with solids, pt has consumed pureed foods for may years. Pills must be crushed.    Assessment / Plan / Recommendation Clinical Impression  Pt demonstrates evidence of a primary esophageal or cervical esophageal dysphagia, consistent with pts dtr description of pts baseline. Pt has been unable to swallow anything but very soft/puree foods for many years and requires her pills to be crushed. With SLP, pt is anxious and grimacing when swallowing puree. There is no evidence of aspiration but pt does have a slightly audible swallow. No intervention is necessary other than basic esophageal precautions as pt  unlikely to be a candidate for any intervention based on age. Will start a Dys 2 (fine chop) diet with thin liquids, meds crushed. Pt does not want purees. Will f/u for tolerance of texture.     Aspiration Risk  Mild    Diet Recommendation Dysphagia 2 (Fine chop);Thin liquid   Liquid Administration via: Cup;Straw Medication Administration: Crushed with puree Supervision: Staff to assist with self feeding Compensations: Slow rate;Small sips/bites;Follow solids with liquid Postural Changes and/or Swallow Maneuvers: Seated upright 90 degrees;Upright 30-60 min after meal    Other  Recommendations Oral Care Recommendations: Oral care BID   Follow Up Recommendations  None    Frequency and Duration min 1 x/week  1 week   Pertinent Vitals/Pain NA    SLP Swallow Goals     Swallow Study Prior Functional Status       General HPI: Danielle [name] is an 79 y.o. female with multiple medical problems including CAD, CHF, anemia, HTN, deconditioned, urinary incontinence, brought to the ER as her daughter feels she can no longer be able to do her ADLs, and not safe to be at home alone.  She is alert and orient, and was agreeable to see social service for placement.  She has no chest pain, SOB, fever, chills, chest pain, shortness of breath, abdominal pain or cramps.  Evalaution in the ER included a normal WBC, HB, renal fx tests, LFTs and unremarkble UA.  Hospitalist was asked to admit her for failure to thrive, and for SNF placement.  SLP spoke to dtr, she reports a history of dyshpagia with solids, pt has consumed pureed foods for may years. Pills must be crushed.  Type of Study: Bedside swallow evaluation Diet Prior to this Study: NPO Temperature Spikes Noted: No Respiratory Status: Room air History of Recent Intubation: No Behavior/Cognition: Alert;Cooperative;Pleasant mood Oral Cavity - Dentition: Adequate natural dentition Self-Feeding Abilities: Needs assist Patient Positioning: Upright in  bed Baseline Vocal Quality: Clear;Low vocal intensity Volitional Cough: Strong Volitional Swallow: Able to elicit    Oral/Motor/Sensory Function Overall Oral Motor/Sensory Function: Appears within functional limits for tasks assessed   Ice Chips     Thin Liquid Thin Liquid: Within functional limits    Nectar Thick Nectar Thick Liquid: Not tested   Honey Thick Honey Thick Liquid: Not tested   Puree Puree: Impaired Presentation: Spoon Pharyngeal Phase Impairments:  (grimace)   Solid   GO    Solid: Not tested      Harlon Ditty, MA CCC-SLP [phone]  Nethan Caudillo, Riley Nearing 04/02/2013,3:31 PM

## 2013-04-02 NOTE — Progress Notes (Signed)
INITIAL NUTRITION ASSESSMENT  DOCUMENTATION CODES Per approved criteria  -Severe malnutrition in the context of chronic illness   INTERVENTION: Diet texture and liquid consistency per SLP. Recommend Ensure Complete po BID, each supplement provides 350 kcal and 13 grams of protein, once diet order permits. RD to continue to follow nutrition care plan.  NUTRITION DIAGNOSIS: Inadequate oral intake related to FTT as evidenced by poor appetite.   Goal: Intake to meet >90% of estimated nutrition needs.  Monitor:  weight trends, lab trends, I/O's, PO intake, supplement tolerance  Reason for Assessment: Malnutrition Screening Tool  79 y.o. female  Admitting Dx: Failure to thrive  ASSESSMENT: PMHx significant for CAD, CHF, anemia, HTN. Admitted from home, family reports that they are unable to care for patient at home and would like SNF. Work-up reveals FTT.  Pt currently NPO - had trouble swallowing pills in applesauce this morning. SLP consult pending. Pt is poor historian - unable to answer any of my questions.  Nutrition Focused Physical Exam:  Subcutaneous Fat:  Orbital Region: moderate depletion Upper Arm Region: severe depletion Thoracic and Lumbar Region: n/a  Muscle:  Temple Region: severe depletion Clavicle Bone Region: severe depletion Clavicle and Acromion Bone Region: moderate depletion Scapular Bone Region: n/a Dorsal Hand: n/a Patellar Region: WNL Anterior Thigh Region: WNL Posterior Calf Region: WNL  Edema: 1+ edema  Pt meets criteria for severe MALNUTRITION in the context of chronic illness as evidenced by severe fat and muscle mass loss, suspected intake of <75% x at least 1 month.  Potassium low at 3.4 and trending down.  Height: Ht Readings from Last 1 Encounters:  03/31/13 5\' 4"  (1.626 m)    Weight: Wt Readings from Last 1 Encounters:  04/01/13 129 lb 3.2 oz (58.605 kg)    Ideal Body Weight: 120 lb  % Ideal Body Weight: 108%  Wt Readings  from Last 10 Encounters:  04/01/13 129 lb 3.2 oz (58.605 kg)  12/14/12 141 lb (63.957 kg)  11/09/12 126 lb 3.2 oz (57.244 kg)  11/09/12 126 lb 3.2 oz (57.244 kg)    Usual Body Weight: n/a  % Usual Body Weight: n/a  BMI:  Body mass index is 22.17 kg/(m^2). Normal weight  Estimated Nutritional Needs: Kcal: 1250 - 1450 kcal Protein: 60 - 70 g Fluid: at least 1.5 liters daily  Skin: intact  Diet Order: NPO  EDUCATION NEEDS: -No education needs identified at this time   Intake/Output Summary (Last 24 hours) at 04/02/13 1209 Last data filed at 04/02/13 0916  Gross per 24 hour  Intake    120 ml  Output      3 ml  Net    117 ml    Last BM: 12/25  Labs:   Recent Labs Lab 03/31/13 1648 04/02/13 0449  NA 144 142  K 3.6 3.4*  CL 105 106  CO2 26 28  BUN 29* 26*  CREATININE 0.94 0.97  CALCIUM 8.7 7.8*  GLUCOSE 84 113*    CBG (last 3)  No results found for this basename: GLUCAP,  in the last 72 hours  Scheduled Meds: . amLODipine  5 mg Oral Daily   And  . irbesartan  150 mg Oral Daily  . aspirin EC  81 mg Oral Daily  . collagenase  1 application Topical Daily  . docusate sodium  100 mg Oral BID  . ferrous sulfate  325 mg Oral BID  . furosemide  20 mg Oral BID  . isosorbide mononitrate  60 mg  Oral Daily  . ketorolac  1 drop Both Eyes QID  . metoprolol tartrate  25 mg Oral TID  . Rivaroxaban  20 mg Oral Q supper    Continuous Infusions:   Past Medical History  Diagnosis Date  . Coronary artery disease   . CHF (congestive heart failure)   . Hypertension   . Anemia     acute blood loss from hip surgery 11/2012    Past Surgical History  Procedure Laterality Date  . Femur im nail Left 11/09/2012    Procedure: INTRAMEDULLARY (IM) NAIL FEMORAL/Left;  Surgeon: Toni Arthurs, MD;  Location: [facility] OR;  Service: Orthopedics;  Laterality: Left;   Jarold Motto MS, RD, LDN Pager: [phone] After-hours pager: [phone]

## 2013-04-02 NOTE — Progress Notes (Signed)
  Echocardiogram 2D Echocardiogram has been performed.  Cathie Beams 04/02/2013, 1:38 PM

## 2013-04-02 NOTE — Progress Notes (Signed)
Clinical Social Work Department BRIEF PSYCHOSOCIAL ASSESSMENT 04/02/2013  Patient:  HONESTII, MARTON     Account Number:  [id number]     Admit date:  03/31/2013  Clinical Social Worker:  Carren Rang  Date/Time:  04/02/2013 02:08 PM  Referred by:  Physician  Date Referred:  04/02/2013 Referred for  SNF Placement   Other Referral:   Interview type:  Family Other interview type:    PSYCHOSOCIAL DATA Living Status:  ALONE Admitted from facility:   Level of care:   Primary support name:  [name] Primary support relationship to patient:  CHILD, ADULT Degree of support available:   Good    CURRENT CONCERNS Current Concerns  Post-Acute Placement   Other Concerns:    SOCIAL WORK ASSESSMENT / PLAN Clinical Social Worker received referral for SNF placement at d/c. CSW went into room and introduced self and explained reason for visit. Patient was not in the room at the time. Patient had family by bedside. CSW explained SNF process to patient's family.  Patient's daughter reported she is agreeable for SNF placement and has a preference for [facility].  CSW encouraged family to think about additional SNF options pending availability of preferred facility. CSW will complete FL2 for MD's signature and will update patient and family when bed offers are received.   Assessment/plan status:  Psychosocial Support/Ongoing Assessment of Needs Other assessment/ plan:   Information/referral to community resources:   SNF information    PATIENT'S/FAMILY'S RESPONSE TO PLAN OF CARE: Patient's daughter is agreeable to SNF placement and states patient has been to [facility] before and liked it.        Maree Krabbe, MSW, Theresia Majors [phone]

## 2013-04-02 NOTE — Progress Notes (Signed)
Patient care transfer to Cardiology , PCP service. Discussed with Dr Sharyn Lull.

## 2013-04-02 NOTE — Consult Note (Signed)
Reason for Consult: left elbow injury Referring Physician:  Sharyn Lull, MD  Danielle [name] is an 79 y.o. female.  HPI: 80 yo female admitted with failure to thrive issues.  She reported having elbow pain associated with recent fall.  X-rays were ordered by primary team, revealing fracture.  Orthopaedics was consulted to direct care. No other injuries to report  Past Medical History  Diagnosis Date  . Coronary artery disease   . CHF (congestive heart failure)   . Hypertension   . Anemia     acute blood loss from hip surgery 11/2012    Past Surgical History  Procedure Laterality Date  . Femur im nail Left 11/09/2012    Procedure: INTRAMEDULLARY (IM) NAIL FEMORAL/Left;  Surgeon: Toni Arthurs, MD;  Location: [facility] OR;  Service: Orthopedics;  Laterality: Left;    History reviewed. No pertinent family history.  Social History:  reports that she has never smoked. She has never used smokeless tobacco. She reports that she does not drink alcohol. Her drug history is not on file.  Allergies:  Allergies  Allergen Reactions  . Keflex [Cephalexin] Itching  . Penicillins Itching    Medications:  I have reviewed the patient's current medications. Scheduled: . amLODipine  5 mg Oral Daily   And  . irbesartan  150 mg Oral Daily  . aspirin EC  81 mg Oral Daily  . collagenase  1 application Topical Daily  . docusate sodium  100 mg Oral BID  . ferrous sulfate  325 mg Oral BID  . furosemide  20 mg Oral BID  . isosorbide mononitrate  60 mg Oral Daily  . ketorolac  1 drop Both Eyes QID  . metoprolol tartrate  25 mg Oral TID  . Rivaroxaban  20 mg Oral Q supper    Results for orders placed during the hospital encounter of 03/31/13 (from the past 24 hour(s))  TROPONIN I     Status: None   Collection Time    04/01/13 11:05 PM      Result Value Range   Troponin I <0.30  <0.30 ng/mL  CBC     Status: Abnormal   Collection Time    04/02/13  4:49 AM      Result Value Range   WBC 9.2  4.0 - 10.5  K/uL   RBC 3.23 (*) 3.87 - 5.11 MIL/uL   Hemoglobin 10.1 (*) 12.0 - 15.0 g/dL   HCT 16.1 (*) 09.6 - 04.5 %   MCV 93.2  78.0 - 100.0 fL   MCH 31.3  26.0 - 34.0 pg   MCHC 33.6  30.0 - 36.0 g/dL   RDW 40.9 (*) 81.1 - 91.4 %   Platelets 153  150 - 400 K/uL  BASIC METABOLIC PANEL     Status: Abnormal   Collection Time    04/02/13  4:49 AM      Result Value Range   Sodium 142  135 - 145 mEq/L   Potassium 3.4 (*) 3.5 - 5.1 mEq/L   Chloride 106  96 - 112 mEq/L   CO2 28  19 - 32 mEq/L   Glucose, Bld 113 (*) 70 - 99 mg/dL   BUN 26 (*) 6 - 23 mg/dL   Creatinine, Ser 7.82  0.50 - 1.10 mg/dL   Calcium 7.8 (*) 8.4 - 10.5 mg/dL   GFR calc non Af Amer 48 (*) >90 mL/min   GFR calc Af Amer 56 (*) >90 mL/min  TROPONIN I     Status:  None   Collection Time    04/02/13  4:49 AM      Result Value Range   Troponin I <0.30  <0.30 ng/mL  TROPONIN I     Status: None   Collection Time    04/02/13 11:46 AM      Result Value Range   Troponin I <0.30  <0.30 ng/mL  TROPONIN I     Status: None   Collection Time    04/02/13  6:30 PM      Result Value Range   Troponin I <0.30  <0.30 ng/mL    X-ray: EXAM:   LEFT ELBOW - 2 VIEW   COMPARISON: None   FINDINGS:  A transverse supracondylar fractures appreciated demonstrating mild  posterior displacement. Areas of osteophytosis identified along the  periphery of the epicondyles and olecranon. The bones are  osteopenic. A joint effusion is appreciated   IMPRESSION:  Supracondylar fracture.   Electronically Signed  By: Salome Holmes M.[name].  On: 04/02/2013 11:46   ROS Rewiew of Systems:  As per admitting H&P Constitutional: Negative for malaise, fever and chills. No significant weight loss or weight gain  Eyes: Negative for eye pain, redness and discharge, diplopia, visual changes, or flashes of light.  ENMT: Negative for ear pain, hoarseness, nasal congestion, sinus pressure and sore throat. No headaches; tinnitus, drooling, or problem swallowing.   Cardiovascular: Negative for chest pain, palpitations, diaphoresis, dyspnea and peripheral edema. ; No orthopnea, PND  Respiratory: Negative for cough, hemoptysis, wheezing and stridor. No pleuritic chestpain.  Gastrointestinal: Negative for nausea, vomiting, diarrhea, constipation, abdominal pain, melena, blood in stool, hematemesis, jaundice and rectal bleeding.  Genitourinary: Negative for frequency, dysuria, incontinence,flank pain and hematuria;  Musculoskeletal: Negative for back pain and neck pain. Left elbow pain without deformity or lacerations  Skin: . Negative for pruritus, rash, abrasions, bruising and skin lesion.; ulcerations  Neuro: Negative for headache, lightheadedness and neck stiffness. Negative for weakness, altered level of consciousness , altered mental status, extremity weakness, burning feet, involuntary movement, seizure and syncope.  Psych: negative for anxiety, depression, insomnia, tearfulness, panic attacks, hallucinations, paranoia, suicidal or homicidal    Blood pressure 105/67, pulse 90, temperature 99.2 F (37.3 C), temperature source Oral, resp. rate 18, height 5\' 4"  (1.626 m), weight 58.605 kg (129 lb 3.2 oz), SpO2 99.00%.  Physical Exam  Constitutional: No distress.  HENT:  Head: Atraumatic.  Cardiovascular: Normal rate.   Musculoskeletal:       Right elbow: Normal.      Left elbow: She exhibits decreased range of motion. She exhibits no effusion, no deformity and no laceration. Tenderness found.       Left wrist: Normal.       Right hip: Normal.       Left hip: Normal.    Assessment/Plan: Closed transverse fracture involving left elbow supracondylar region  recommend non-operative management in this 79 yo female Order placed for Ortho tech to apply posterior long arm splint and sling Non weight bearing through left upper extremity  Follow up in our office with Dr. Melvyn Novas in 2-3 weeks for radiographic follow up in case treatment necessitates a  change   Danielle [name] 04/02/2013, 10:43 PM

## 2013-04-02 NOTE — Care Management Note (Addendum)
   CARE MANAGEMENT NOTE 04/02/2013  Patient:  Danielle [name], Danielle [name]   Account Number:  [id number]  Date Initiated:  04/02/2013  Documentation initiated by:  Yojan Paskett  Subjective/Objective Assessment:   order for LTAC, will followup to make sure this may be for SNF will follow.     Action/Plan:   Anticipated DC Date:     Anticipated DC Plan:           Choice offered to / List presented to:             Status of service:   Medicare Important Message given?   (If response is "NO", the following Medicare IM given date fields will be blank) Date Medicare IM given:   Date Additional Medicare IM given:    Discharge Disposition:    Per UR Regulation:    If discussed at Long Length of Stay Meetings, dates discussed:    Comments:  05/03/2012 Consult for LTAC, most likely meant to be SNF as I see no acute needs for this pt that would qualify her for LTAC, will continue to follow.  Johny Shock RN MPH [phone] Will need PT/OT eval to qualify for pt insurance to pay for SNF.               CRoyal RN MPH, case Production designer, theatre/television/film

## 2013-04-03 LAB — URINALYSIS, ROUTINE W REFLEX MICROSCOPIC
Glucose, UA: NEGATIVE mg/dL
Nitrite: NEGATIVE
Specific Gravity, Urine: 1.014 (ref 1.005–1.030)
pH: 5 (ref 5.0–8.0)

## 2013-04-03 LAB — TROPONIN I
Troponin I: <0.3 ng/mL (ref <0.30)
Troponin I: <0.3 ng/mL (ref <0.30)

## 2013-04-03 LAB — URINE MICROSCOPIC-ADD ON

## 2013-04-03 MED ORDER — RIVAROXABAN 15 MG PO TABS
15.0000 mg | ORAL_TABLET | Freq: Every day | ORAL | Status: DC
  Administered 2013-04-03 – 2013-04-05 (×3): 15 mg via ORAL
  Filled 2013-04-03 (×4): qty 1

## 2013-04-03 MED ORDER — DOCUSATE SODIUM 50 MG/5ML PO LIQD
100.0000 mg | Freq: Two times a day (BID) | ORAL | Status: DC
  Administered 2013-04-03 – 2013-04-06 (×6): 100 mg via ORAL
  Filled 2013-04-03 (×8): qty 10

## 2013-04-03 MED ORDER — CIPROFLOXACIN HCL 500 MG PO TABS
500.0000 mg | ORAL_TABLET | Freq: Two times a day (BID) | ORAL | Status: DC
  Administered 2013-04-03 – 2013-04-06 (×7): 500 mg via ORAL
  Filled 2013-04-03 (×9): qty 1

## 2013-04-03 NOTE — Evaluation (Signed)
Occupational Therapy Evaluation Patient Details Name: Danielle [name] MRN: [id number] DOB: 1916/09/18 Today's Date: 04/03/2013 Time: 4098-1191 OT Time Calculation (min): 12 min  OT Assessment / Plan / Recommendation History of present illness   Danielle [name] is an 79 y.o. female with multiple medical problems including CAD, CHF, anemia, HTN, deconditioned, urinary incontinence, brought to the ER as her daughter feels she can no longer be able to do her ADLs, and not safe to be at home alone. She reported having elbow pain associated with recent fall. X-rays were ordered by primary team, revealing fracture. Orthopaedics was consulted to direct care and recommend non-operative management of closed transverse fx involving left elbow supracondylar region.  Pt place in long arm splint and sling.    Clinical Impression   Pt admitted with above. Will continue to follow acutely in order to address below problem list. Recommending SNF for d/c planning.    OT Assessment  Patient needs continued OT Services    Follow Up Recommendations  SNF;Supervision/Assistance - 24 hour    Barriers to Discharge      Equipment Recommendations   (Defer to next venue)    Recommendations for Other Services    Frequency  Min 2X/week    Precautions / Restrictions Precautions Precautions: Fall Precaution Comments: multiple recent falls per daughter Restrictions Weight Bearing Restrictions: Yes LUE Weight Bearing: Non weight bearing   Pertinent Vitals/Pain See vitals    ADL  Grooming: Performed;Wash/dry face;Moderate assistance Where Assessed - Grooming: Supine, head of bed up ADL Comments: Total assist for bathing/dressing ADLs at bed level.  Pt confused and difficulty following one step commands.    OT Diagnosis: Generalized weakness;Cognitive deficits;Acute pain  OT Problem List: Decreased strength;Decreased activity tolerance;Decreased cognition;Decreased knowledge of use of DME or AE;Pain OT  Treatment Interventions: Self-care/ADL training;DME and/or AE instruction;Therapeutic activities;Patient/family education;Balance training;Cognitive remediation/compensation   OT Goals(Current goals can be found in the care plan section) Acute Rehab OT Goals Patient Stated Goal: to not have pain OT Goal Formulation: With patient Time For Goal Achievement: 04/17/13 Potential to Achieve Goals: Good  Visit Information  Last OT Received On: 04/03/13 Assistance Needed: +2       Prior Functioning     Home Living Family/patient expects to be discharged to:: Skilled nursing facility Prior Function Level of Independence: Needs assistance Comments: Info from PT eval since pt unable to provide- Pt ambulated with rollator at home. Daughter assisted with ADLs. Communication Communication: No difficulties Dominant Hand: Right         Vision/Perception     Cognition  Cognition Arousal/Alertness: Awake/alert Behavior During Therapy: Restless Overall Cognitive Status: Impaired/Different from baseline Area of Impairment: Orientation;Memory;Problem solving Orientation Level: Disoriented to;Place;Time;Situation Memory: Decreased short-term memory Problem Solving: Slow processing;Decreased initiation;Requires verbal cues;Requires tactile cues    Extremity/Trunk Assessment Upper Extremity Assessment Upper Extremity Assessment: Generalized weakness;LUE deficits/detail;Difficult to assess due to impaired cognition LUE Deficits / Details: NWB LUE     Mobility Bed Mobility Bed Mobility: Rolling Right;Rolling Left Rolling Right: 2: Max assist Rolling Left: 2: Max assist Details for Bed Mobility Assistance: Assist with use of draw pad.  Cues to avoid use of LUE.     Exercise     Balance     End of Session OT - End of Session Activity Tolerance: Patient limited by lethargy;Patient limited by fatigue Patient left: in bed;with call bell/phone within reach  GO    04/03/2013 Cipriano Mile OTR/L Pager [phone] Office [phone]  Smitty Pluck  Danielle [name] 04/03/2013, 5:25 PM

## 2013-04-03 NOTE — Progress Notes (Signed)
CSW spoke with pt's daughter (via phone) re: bed offers.  Paper list left in room on bedside table.  Dtr. Prefers [facility] (no offer yet) but may consider [facility].  Unit CSW to f/u on Monday 12/29.

## 2013-04-03 NOTE — Progress Notes (Signed)
Orthopedic Tech Progress Note Patient Details:  Danielle [name] 1916/12/18 [id number]  Ortho Devices Type of Ortho Device: Arm sling;Long arm splint   Haskell Flirt 04/03/2013, 7:58 AM

## 2013-04-03 NOTE — Progress Notes (Signed)
Subjective:  Appreciate orthopedic consult  and help. Patient denies any chest pain or shortness of breath. Left elbow pain improved Patient had Foley catheter placed yesterday due to excoriation of skin. Urine appears cloudy Objective:  Vital Signs in the last 24 hours: Temp:  [97.9 F (36.6 C)-99.2 F (37.3 C)] 97.9 F (36.6 C) (12/27 0905) Pulse Rate:  [82-97] 97 (12/27 0905) Resp:  [17-18] 17 (12/27 0905) BP: (105-131)/(67-79) 131/79 mmHg (12/27 0905) SpO2:  [95 %-99 %] 96 % (12/27 0905)  Intake/Output from previous day: 12/26 0701 - 12/27 0700 In: 480 [P.O.:480] Out: 4 [Urine:2; Stool:2] Intake/Output from this shift: Total I/O In: 360 [P.O.:360] Out: -   Physical Exam: Neck: no adenopathy, no carotid bruit, no JVD and supple, symmetrical, trachea midline Lungs: Decreased breath sound at bases Heart: regular rate and rhythm, S1, S2 normal and Soft systolic and diastolic murmur noted Abdomen: soft, non-tender; bowel sounds normal; no masses,  no organomegaly Extremities: No clubbing cyanosis 1+ edema noted  Lab Results:  Recent Labs  03/31/13 1648 04/02/13 0449  WBC 9.3 9.2  HGB 11.3* 10.1*  PLT 189 153    Recent Labs  03/31/13 1648 04/02/13 0449  NA 144 142  K 3.6 3.4*  CL 105 106  CO2 26 28  GLUCOSE 84 113*  BUN 29* 26*  CREATININE 0.94 0.97    Recent Labs  04/02/13 2245 04/03/13 0730  TROPONINI <0.30 <0.30   Hepatic Function Panel  Recent Labs  03/31/13 1648  PROT 6.3  ALBUMIN 2.9*  AST 52*  ALT 23  ALKPHOS 90  BILITOT 0.9   No results found for this basename: CHOL,  in the last 72 hours No results found for this basename: PROTIME,  in the last 72 hours  Imaging: Imaging results have been reviewed and Dg Elbow 2 Views Left  04/02/2013   CLINICAL DATA:  Pain status post fall  EXAM: LEFT ELBOW - 2 VIEW  COMPARISON:  None  FINDINGS: A transverse supracondylar fractures appreciated demonstrating mild posterior displacement. Areas of  osteophytosis identified along the periphery of the epicondyles and olecranon. The bones are osteopenic. A joint effusion is appreciated  IMPRESSION: Supracondylar fracture.   Electronically Signed   By: Salome Holmes M.D.   On: 04/02/2013 11:46    Cardiac Studies:  Assessment/Plan:  Status post Atypical chest pain MI ruled out  Mild decompensated congestive heart failure secondary to preserved LV systolic function  Coronary artery disease  Supracondylar fracture left elbow Hypertension  Hypothyroidism  Degenerative joint disease  Chronic anemia  Failure to thrive  Musculoskeletal pain  Rule out UTI Plan Check UA and urine culture Start Cipro as per orders Awaiting skilled nursing facility  LOS: 3 days    Anil Havard N 04/03/2013, 12:22 PM

## 2013-04-04 MED ORDER — MENTHOL 3 MG MT LOZG
1.0000 | LOZENGE | OROMUCOSAL | Status: DC | PRN
  Administered 2013-04-04: 3 mg via ORAL
  Filled 2013-04-04: qty 9

## 2013-04-04 NOTE — Progress Notes (Signed)
Subjective:  Patient denies any chest pain no shortness of breath. Left elbow pain improved after applying splint no tingling numbness in fingers  Objective:  Vital Signs in the last 24 hours: Temp:  [98.2 F (36.8 C)-98.9 F (37.2 C)] 98.2 F (36.8 C) (12/28 1030) Pulse Rate:  [70-82] 82 (12/28 1030) Resp:  [16-18] 16 (12/28 1030) BP: (95-122)/(57-92) 122/92 mmHg (12/28 1030) SpO2:  [93 %-100 %] 100 % (12/28 1030)  Intake/Output from previous day: 12/27 0701 - 12/28 0700 In: 990 [P.O.:990] Out: 630 [Urine:630] Intake/Output from this shift:    Physical Exam: Neck: no adenopathy, no carotid bruit, no JVD and supple, symmetrical, trachea midline Lungs: Decreased breath sound at bases Heart: regular rate and rhythm, S1, S2 normal and Soft systolic and diastolic murmur noted Abdomen: soft, non-tender; bowel sounds normal; no masses,  no organomegaly Extremities: No clubbing cyanosis 1+ edema noted  Lab Results:  Recent Labs  04/02/13 0449  WBC 9.2  HGB 10.1*  PLT 153    Recent Labs  04/02/13 0449  NA 142  K 3.4*  CL 106  CO2 28  GLUCOSE 113*  BUN 26*  CREATININE 0.97    Recent Labs  04/03/13 1140 04/03/13 1800  TROPONINI <0.30 <0.30   Hepatic Function Panel No results found for this basename: PROT, ALBUMIN, AST, ALT, ALKPHOS, BILITOT, BILIDIR, IBILI,  in the last 72 hours No results found for this basename: CHOL,  in the last 72 hours No results found for this basename: PROTIME,  in the last 72 hours  Imaging: Imaging results have been reviewed and Dg Elbow 2 Views Left  04/02/2013   CLINICAL DATA:  Pain status post fall  EXAM: LEFT ELBOW - 2 VIEW  COMPARISON:  None  FINDINGS: A transverse supracondylar fractures appreciated demonstrating mild posterior displacement. Areas of osteophytosis identified along the periphery of the epicondyles and olecranon. The bones are osteopenic. A joint effusion is appreciated  IMPRESSION: Supracondylar fracture.    Electronically Signed   By: Salome Holmes M.D.   On: 04/02/2013 11:46    Cardiac Studies:  Assessment/Plan:  Status post Atypical chest pain MI ruled out  Mild decompensated congestive heart failure secondary to preserved LV systolic function  Coronary artery disease  Supracondylar fracture left elbow  Hypertension  Hypothyroidism  Degenerative joint disease  Chronic anemia  Failure to thrive  Musculoskeletal pain  Probable UTI  Plan Continue present management Check urine culture results Awaiting skilled nursing facility  LOS: 4 days    Danielle [name] N 04/04/2013, 11:04 AM

## 2013-04-05 LAB — URINE CULTURE: Colony Count: >=100000

## 2013-04-05 LAB — BASIC METABOLIC PANEL
BUN: 23 mg/dL (ref 6–23)
Chloride: 100 mEq/L (ref 96–112)
GFR calc Af Amer: 61 mL/min — ABNORMAL LOW (ref >90)
Glucose, Bld: 122 mg/dL — ABNORMAL HIGH (ref 70–99)
Potassium: 3.6 mEq/L (ref 3.5–5.1)
Sodium: 136 mEq/L (ref 135–145)

## 2013-04-05 LAB — CBC
HCT: 30.6 % — ABNORMAL LOW (ref 36.0–46.0)
Hemoglobin: 10.5 g/dL — ABNORMAL LOW (ref 12.0–15.0)
MCHC: 34.3 g/dL (ref 30.0–36.0)
Platelets: 204 10*3/uL (ref 150–400)
RDW: 15.7 % — ABNORMAL HIGH (ref 11.5–15.5)
WBC: 7.8 10*3/uL (ref 4.0–10.5)

## 2013-04-05 MED ORDER — CIPROFLOXACIN HCL 500 MG PO TABS: 500.0000 mg | ORAL_TABLET | Freq: Two times a day (BID) | ORAL | Status: DC

## 2013-04-05 MED ORDER — LISINOPRIL 20 MG PO TABS: 20.0000 mg | ORAL_TABLET | Freq: Every day | ORAL | Status: DC

## 2013-04-05 MED ORDER — LISINOPRIL 20 MG PO TABS
20.0000 mg | ORAL_TABLET | Freq: Every day | ORAL | Status: DC
  Administered 2013-04-05 – 2013-04-06 (×2): 20 mg via ORAL
  Filled 2013-04-05 (×2): qty 1

## 2013-04-05 NOTE — Progress Notes (Signed)
Clinical Social Worker (CSW) contacted patient's daughter [name] [phone] who reported she wants patient to return to [facility] for short term rehab. CSW contacted admissions coordinator at [facility] who reported that patient owes an outstanding debt of $3,554.00 and can come back to [facility] if that amount is paid in full before admission. Patient's daughter reported that she can get the money from the patient's bank account after she gets the power of attorney paper work completed. Daughter reported that the POA paperwork has been completed and notarized except for 1 witness signature and the daughter reported that she would get a witness today and go to the back get the money and go to [facility] to pay the outstanding debt. CSW has been contacted by patient's Gertie Exon [phone] who reported that the daughter and granddaughter Hyman Bible are actively working on paying the debt off at [facility]. CSW called the daughter [name] 10 times after 3 pm and could not get a response. CSW contacted the [facility] [employer] and reported that if the family has not taken care of the debt at [facility] by 11 am tomorrow then the family will have to choose a different facility or take the patient home. Gertie Exon verbalized his understanding and reported that he would pass this information on to the daughter [name]. CSW gave patient a list of other bed offers and explained to patient that she would have to pick a facility to go to tomorrow if the family can't pay the debt at [facility]. CSW will assist with D/C tomorrow Tuesday 04/06/13.   Jetta Lout, LCSWA Weekend CSW [phone]

## 2013-04-05 NOTE — Progress Notes (Signed)
Physical Therapy Treatment Patient Details Name: KONYA FAUBLE MRN: [id number] DOB: [birth year] Today's Date: 04/05/2013 Time: 2952-8413 PT Time Calculation (min): 14 min  PT Assessment / Plan / Recommendation  History of Present Illness   Patient is 79 year old female with past medical history significant for moderate coronary artery disease, history of congestive heart failure secondary to preserved LV systolic function, hypertension, hypothyroidism, degenerative joint disease, history of recent hip fracture, anemia of chronic disease, was admitted yesterday because of failure to thrive and vague retrosternal chest pain off and on. Patient had EKG done which showed left bundle branch block and this morning was noted to have minimally elevated troponin I. Patient states her she had fall at home a week ago and complains of generalized back pain chest pain. States chest pain increases with movement. Patient presently is alert awake and oriented. Denies any shortness of breath. Denies any palpitation. Patient had cardiac cath her approximately 2-1/2 years ago which showed mild to moderate coronary artery disease with preserved LV systolic function. 2-D echo has been ordered which has not been done yet. Patient's activity is very limited mostly she is bedbound. As per daughter as she could not take care of her at home and was admitted to be transferred to skilled nursing facility.    PT Comments   Pt able to transfer to chair today with 2 person (A). Pt is unsteady and limited due to NWB status and pain in Lt UE. Pt is awaiting placement for SNF today. Pt continues to be confused on place and situation. Will cont to follow per POC.   Follow Up Recommendations  SNF;Supervision/Assistance - 24 hour     Does the patient have the potential to tolerate intense rehabilitation     Barriers to Discharge        Equipment Recommendations  Other (comment)    Recommendations for Other Services OT consult   Frequency Min 2X/week   Progress towards PT Goals Progress towards PT goals: Progressing toward goals  Plan Current plan remains appropriate    Precautions / Restrictions Precautions Precautions: Fall Precaution Comments: multiple recent falls per daughter Restrictions Weight Bearing Restrictions: Yes LUE Weight Bearing: Non weight bearing   Pertinent Vitals/Pain C/o pain during activity; did not rate pain. patient repositioned for comfort with pillow under Lt UE to elevate it      Mobility  Bed Mobility Bed Mobility: Supine to Sit;Sitting - Scoot to Edge of Bed Supine to Sit: 1: +2 Total assist;HOB elevated;With rails Supine to Sit: Patient Percentage: 40% Sitting - Scoot to Edge of Bed: 2: Max assist Details for Bed Mobility Assistance: pt required 2+ (A) to bring to upright sitting position due to weakness and pain in Lt UE; cues to maintain nwb status on Lt UE Transfers Transfers: Sit to Stand;Stand to Sit;Stand Pivot Transfers Sit to Stand: 1: +2 Total assist;From bed;With upper extremity assist Sit to Stand: Patient Percentage: 30% Stand to Sit: 1: +2 Total assist;To chair/3-in-1;With armrests;With upper extremity assist Stand to Sit: Patient Percentage: 30% Stand Pivot Transfers: 1: +2 Total assist;From elevated surface Stand Pivot Transfers: Patient Percentage: 20% Details for Transfer Assistance: pt with difficulty achieving upright standing position; requries 2+ (A) for transfers; requires (A) to facilitate weight shift for pivotal steps; max cues for sequencing and hand placement and to maintain NWB status on lt UE  Ambulation/Gait Ambulation/Gait Assistance: Not tested (comment) Stairs: No Wheelchair Mobility Wheelchair Mobility: No         PT Diagnosis:  PT Problem List:   PT Treatment Interventions:     PT Goals (current goals can now be found in the care plan section) Acute Rehab PT Goals Patient Stated Goal: none stated PT Goal Formulation: With  patient Time For Goal Achievement: 04/16/13 Potential to Achieve Goals: Fair  Visit Information  Last PT Received On: 04/05/13 Assistance Needed: +2    Subjective Data  Subjective: lying supine; lethargic but arrousable when brought to EOB or with tactile cues; agreeable to attempt transfer to chair  Patient Stated Goal: none stated   Cognition  Cognition Arousal/Alertness: Lethargic Behavior During Therapy: Flat affect Overall Cognitive Status: Impaired/Different from baseline Area of Impairment: Orientation;Following commands;Problem solving Orientation Level: Disoriented to;Situation;Place Memory: Decreased short-term memory Following Commands: Follows one step commands consistently;Follows one step commands with increased time Problem Solving: Slow processing;Decreased initiation;Requires verbal cues;Requires tactile cues    Balance  Balance Balance Assessed: Yes Static Sitting Balance Static Sitting - Balance Support: Right upper extremity supported;Feet supported Static Sitting - Level of Assistance: 5: Stand by assistance Static Sitting - Comment/# of Minutes: tolerated sitting EOB ~4 min   End of Session PT - End of Session Equipment Utilized During Treatment: Gait belt Activity Tolerance: Patient tolerated treatment well Patient left: in chair;with call bell/phone within reach Nurse Communication: Mobility status;Precautions   GP     Donell Sievert, [place] [phone] 04/05/2013, 2:52 PM

## 2013-04-05 NOTE — Progress Notes (Signed)
Subjective:  Patient denies any chest pain or shortness of breath. Awaiting skilled nursing facility hopefully will be transferred today  Objective:  Vital Signs in the last 24 hours: Temp:  [98 F (36.7 C)-98.5 F (36.9 C)] 98.5 F (36.9 C) (12/29 0953) Pulse Rate:  [72-81] 80 (12/29 0953) Resp:  [16-18] 18 (12/29 0953) BP: (105-139)/(55-102) 139/80 mmHg (12/29 0953) SpO2:  [95 %-100 %] 100 % (12/29 0953)  Intake/Output from previous day: 12/28 0701 - 12/29 0700 In: -  Out: 250 [Urine:250] Intake/Output from this shift: Total I/O In: 240 [P.O.:240] Out: -   Physical Exam: Neck: no adenopathy, no carotid bruit, no JVD and supple, symmetrical, trachea midline Lungs: Decreased breath sound at bases Heart: regular rate and rhythm, S1, S2 normal and Soft systolic and diastolic murmur noted Abdomen: soft, non-tender; bowel sounds normal; no masses,  no organomegaly Extremities: No clubbing cyanosis 1+ edema noted  Lab Results:  Recent Labs  04/05/13 0630  WBC 7.8  HGB 10.5*  PLT 204    Recent Labs  04/05/13 0630  NA 136  K 3.6  CL 100  CO2 26  GLUCOSE 122*  BUN 23  CREATININE 0.91    Recent Labs  04/03/13 1140 04/03/13 1800  TROPONINI <0.30 <0.30   Hepatic Function Panel No results found for this basename: PROT, ALBUMIN, AST, ALT, ALKPHOS, BILITOT, BILIDIR, IBILI,  in the last 72 hours No results found for this basename: CHOL,  in the last 72 hours No results found for this basename: PROTIME,  in the last 72 hours  Imaging: Imaging results have been reviewed and No results found.  Cardiac Studies:  Assessment/Plan:  Status post Atypical chest pain MI ruled out  Compensated congestive heart failure secondary to depressed LV systolic function Coronary artery disease  Supracondylar fracture left elbow  Hypertension  Hypothyroidism  Degenerative joint disease  Chronic anemia  Failure to thrive  Musculoskeletal pain  Resolving UTI  Plan Continue  present management Transfer to skilled nursing facility later today Discharge summary dictated  LOS: 5 days    Danielle [name] 04/05/2013, 12:58 PM

## 2013-04-05 NOTE — Discharge Summary (Signed)
  Priority discharge summary dictated on 04/05/2013 dictation number is [id number]

## 2013-04-06 NOTE — Progress Notes (Signed)
NUTRITION FOLLOW-UP  DOCUMENTATION CODES Per approved criteria  -Severe malnutrition in the context of chronic illness   INTERVENTION: Diet texture and liquid consistency per SLP. Recommend oral nutrition supplements, per RD at SNF. RD to continue to follow nutrition care plan.  NUTRITION DIAGNOSIS: Inadequate oral intake related to FTT as evidenced by poor appetite.   Goal: Intake to meet >90% of estimated nutrition needs.  Monitor:  weight trends, lab trends, I/O's, PO intake, supplement tolerance  ASSESSMENT: PMHx significant for CAD, CHF, anemia, HTN. Admitted from home, family reports that they are unable to care for patient at home and would like SNF. Work-up reveals FTT.  BSE completed by SLP on 12/26, recommended for Dysphagia 2 diet with thin liquids. Currently eating 75-100% of Dysphagia 2 meals.  WOC RN saw pt on 12/26 - pt with MASD. Per chart, pt is awaiting SNF placement, RN is currently calling report to SNF for patient to transfer at this time.  Pt meets criteria for severe MALNUTRITION in the context of chronic illness as evidenced by severe fat and muscle mass loss, suspected intake of <75% x at least 1 month.  Potassium now WNL.  Height: Ht Readings from Last 1 Encounters:  03/31/13 5\' 4"  (1.626 m)    Weight: Wt Readings from Last 1 Encounters:  04/01/13 129 lb 3.2 oz (58.605 kg)  Admit wt 123 lb  BMI:  Body mass index is 22.17 kg/(m^2). Normal weight  Estimated Nutritional Needs: Kcal: 1250 - 1450 kcal Protein: 60 - 70 g Fluid: at least 1.5 liters daily  Skin: intact  Diet Order: Dysphagia 2; thin liquids    Intake/Output Summary (Last 24 hours) at 04/06/13 1451 Last data filed at 04/06/13 1300  Gross per 24 hour  Intake    720 ml  Output    800 ml  Net    -80 ml    Last BM: 12/26  Labs:   Recent Labs Lab 03/31/13 1648 04/02/13 0449 04/05/13 0630  NA 144 142 136  K 3.6 3.4* 3.6  CL 105 106 100  CO2 26 28 26   BUN 29* 26*  23  CREATININE 0.94 0.97 0.91  CALCIUM 8.7 7.8* 7.9*  GLUCOSE 84 113* 122*    CBG (last 3)  No results found for this basename: GLUCAP,  in the last 72 hours  Scheduled Meds: . amLODipine  5 mg Oral Daily   And  . irbesartan  150 mg Oral Daily  . aspirin EC  81 mg Oral Daily  . ciprofloxacin  500 mg Oral BID  . collagenase  1 application Topical Daily  . docusate  100 mg Oral BID  . ferrous sulfate  325 mg Oral BID  . furosemide  20 mg Oral BID  . ketorolac  1 drop Both Eyes QID  . lisinopril  20 mg Oral Daily  . metoprolol tartrate  25 mg Oral TID  . Rivaroxaban  15 mg Oral Q supper    Continuous Infusions:   Jarold Motto MS, RD, LDN Pager: [phone] After-hours pager: [phone]

## 2013-04-06 NOTE — Progress Notes (Signed)
Speech Language Pathology Treatment: Dysphagia  Patient Details Name: Danielle [name] MRN: [id number] DOB: [birth year] Today's Date: 04/06/2013 Time: 1440-1450 SLP Time Calculation (min): 10 min  Assessment / Plan / Recommendation Clinical Impression  Pt seen for f/u dysphagia treatment. SLP provided skilled observation of Dys 2 textures and thin liquids from lunch tray. No overt s/s of aspiration observed, although pt did demonstrate mildly prolonged mastication with solids as well as signs of a primary esophageal component, including a globus sensation and eructation. Continue plan of care.   HPI HPI: NILAYA [name] is an 79 y.o. female with multiple medical problems including CAD, CHF, anemia, HTN, deconditioned, urinary incontinence, brought to the ER as her daughter feels she can no longer be able to do her ADLs, and not safe to be at home alone.  She is alert and orient, and was agreeable to see social service for placement.  She has no chest pain, SOB, fever, chills, chest pain, shortness of breath, abdominal pain or cramps.  Evalaution in the ER included a normal WBC, HB, renal fx tests, LFTs and unremarkble UA.  Hospitalist was asked to admit her for failure to thrive, and for SNF placement. SLP spoke to dtr, she reports a history of dyshpagia with solids, pt has consumed pureed foods for may years. Pills must be crushed.    Pertinent Vitals N/A  SLP Plan  Continue with current plan of care    Recommendations Diet recommendations: Dysphagia 2 (fine chop);Thin liquid Liquids provided via: Cup;Straw;No straw Medication Administration: Crushed with puree Supervision: Staff to assist with self feeding Compensations: Slow rate;Small sips/bites;Follow solids with liquid Postural Changes and/or Swallow Maneuvers: Seated upright 90 degrees;Upright 30-60 min after meal              Oral Care Recommendations: Oral care BID Follow up Recommendations: None Plan: Continue with current plan of  care    GO     Maxcine Ham, M.A. CCC-SLP [phone]  Maxcine Ham 04/06/2013, 2:52 PM

## 2013-04-06 NOTE — Progress Notes (Signed)
Patient is medically stable for D/C today. Patient is going to [facility] Therapist, music). Clinical Child psychotherapist (CSW) prepared D/C packet and confirmed with [employer] at [facility] that everything is set up and ready for the patient to come today. CSW left message with patient's granddaughter Hyman Bible [phone] and made her aware of above. Nursing is aware of above. Please reconsult if further social work needs arise. CSW signing off.   Jetta Lout, LCSWA Weekend CSW [phone]

## 2013-04-06 NOTE — Discharge Summary (Signed)
NAMEANTHONIA, MONGER                ACCOUNT NO.:  [id number]  MEDICAL RECORD NO.:  [id number]  LOCATION:  6E14C                        FACILITY:  [facility]  PHYSICIAN:  Maxfield Gildersleeve N. Sharyn Lull, M.D. DATE OF BIRTH:  05/24/1916  DATE OF ADMISSION:  03/31/2013 DATE OF DISCHARGE:  04/05/2013                              DISCHARGE SUMMARY   ADMITTING DIAGNOSES BY [facility]: 1. Failure to thrive. 2. Congestive heart failure. 3. Hypertension. 4. Hypothyroidism.  FINAL DIAGNOSES: 1. Status post atypical chest pain, myocardial infarction ruled out. 2. Compensated congestive heart failure secondary to mildly depressed     left ventricular systolic function. 3. Coronary artery disease, stable. 4. Supracondylar fracture of left elbow. 5. Hypertension. 6. Hypothyroidism. 7. Degenerative joint disease. 8. Chronic anemia. 9. Failure to thrive. 10.Resolving urinary tract infection.  DISCHARGE MEDICATIONS: 1. Ciprofloxacin 500 mg twice daily for 5 more days. 2. Lisinopril 20 mg 1 tablet daily. 3. Aspirin 81 mg 1 tablet daily. 4. Collagenase ointment applied topically. 5. Lasix 20 mg twice daily. 6. Iron 325 mg 1 tablet twice daily. 7. Acular eye drops as before four times daily. 8. Metoprolol tartrate 25 mg twice daily. 9. Multivitamin with minerals 1 tablet daily. 10.Nitrostat sublingual use as directed. 11.Xarelto 20 mg 1 tablet daily. 12.Vitamin C 2500 mg 1 tablet daily.  The patient has been advised to     stop Azor, clonidine, hydrocodone, Imdur, tramadol, and Ambien.  DIET:  Low salt, low cholesterol.  ACTIVITY:  As tolerated.  The patient has been advised to maintain posterior splint with left upper extremity to protect left elbow fracture, nonweightbearing left upper extremity, elbow fracture.  FOLLOWUP:  The patient to follow up with Orthopedics in 2 weeks.  Follow up with Dr. Shana Chute in 2 weeks.  CONDITION AT DISCHARGE:  Stable.  BRIEF HISTORY AND HOSPITAL COURSE:  The  patient is 79 year old female with multiple medical problems; i.e. coronary artery disease, history of congestive heart failure, anemia, hypertension, deconditioning, urinary incontinence.  She was brought to the ER by her daughter as she felt that she could not able to take care of her at home and she is not safe at home alone.  She is alert, oriented, was agreeable to see social service for placement.  The patient denies any chest pain, shortness of breath, fever, chills, abdominal pain, cramps.  Evaluation in the ER included normal WBCs, renal function test, LFTs, unremarkable UA.  She was admitted by Hospitalist because of failure to thrive and skilled nursing facility placement.  PHYSICAL EXAMINATION:  VITAL SIGNS:  Her blood pressure was 167/95, pulse was 110, she was afebrile. HEENT:  Mucous membranes were pink, PERRLA.  Extraocular muscles were intact.  No chest wall tenderness. LUNGS:  Clear to auscultation bilaterally. CARDIOVASCULAR:  S1, S2 was normal.  There was no murmur or gallop. ABDOMEN:  Soft.  Bowel sounds were present.  Nontender. EXTREMITIES:  There was no clubbing, cyanosis, or edema at the time of admission.  Pulses were 2+.  LABORATORY DATA:  Sodium was 144, potassium 3.6, BUN 29, creatinine 0.94.  Her hemoglobin was 11.3, hematocrit 33.7, white count of 9.3. Her troponin-I was slightly elevated at 0.12.  Repeat  four sets of troponin-I were negative.  Her EKG showed normal sinus rhythm, sinus tachycardia with left bundle-branch block.  Urinalysis showed large bacteria and also E. coli above 100,000.  BRIEF HOSPITAL COURSE:  The patient was admitted to Telemetry Unit.  The patient was seen in Cardiology consultation because of minimally elevated troponin-I.  The patient did not have any episodes of anginal chest pain during the hospital stay.  It complained of musculoskeletal pain and also complained of left elbow pain.  Subsequently had x-ray of left elbow,  which showed supracondylar fracture.  Consultation was obtained with Dr. Durene Romans, and the patient subsequently underwent placement of the splint in the left elbow.  Recommendation was to follow up with Dr. Melvyn Novas in 2-3 weeks and recommendation was nonoperative management at this time.  The patient had posterior long arm splint and sling placed with nonweightbearing through her left upper extremity. The patient's left elbow pain has resolved.  The patient also was noted to have cloudy urine.  Urine culture and urinalysis were sent, which grew E. coli.  The patient has been tolerating p.o. Cipro and remained afebrile during the hospital stay.  The patient did not have any episodes of anginal chest pain during the hospital stay.  The patient will be discharged to skilled nursing facility on above medications and will be followed up by Ortho in 2-3 weeks and Dr. Shana Chute in 2 weeks.     Eduardo Osier. Sharyn Lull, M.D.     [facility]/MEDQ  D:  04/05/2013  T:  04/06/2013  Job:  [id number]

## 2013-04-06 NOTE — Progress Notes (Signed)
Subjective:  Patient denies any chest pain or shortness of breath. Awaiting skilled nursing facility. No changes in discharge summary  Objective:  Vital Signs in the last 24 hours: Temp:  [97.8 F (36.6 C)-99.4 F (37.4 C)] 98.2 F (36.8 C) (12/30 0900) Pulse Rate:  [80-91] 80 (12/30 0900) Resp:  [18] 18 (12/30 0900) BP: (105-157)/(64-93) 157/93 mmHg (12/30 0900) SpO2:  [97 %-100 %] 100 % (12/30 0900)  Intake/Output from previous day: 12/29 0701 - 12/30 0700 In: 600 [P.O.:600] Out: 800 [Urine:800] Intake/Output from this shift:    Physical Exam: Exam unchanged  Lab Results:  Recent Labs  04/05/13 0630  WBC 7.8  HGB 10.5*  PLT 204    Recent Labs  04/05/13 0630  NA 136  K 3.6  CL 100  CO2 26  GLUCOSE 122*  BUN 23  CREATININE 0.91    Recent Labs  04/03/13 1140 04/03/13 1800  TROPONINI <0.30 <0.30   Hepatic Function Panel No results found for this basename: PROT, ALBUMIN, AST, ALT, ALKPHOS, BILITOT, BILIDIR, IBILI,  in the last 72 hours No results found for this basename: CHOL,  in the last 72 hours No results found for this basename: PROTIME,  in the last 72 hours  Imaging: Imaging results have been reviewed and No results found.  Cardiac Studies:  Assessment/Plan:  Status post atypical chest pain, myocardial infarction ruled out.  2. Compensated congestive heart failure secondary to mildly depressed  left ventricular systolic function.  3. Coronary artery disease, stable.  4. Supracondylar fracture of left elbow.  5. Hypertension.  6. Hypothyroidism.  7. Degenerative joint disease.  8. Chronic anemia.  9. Failure to thrive.  10.Resolving urinary tract infection. Plan Continue present management Okay to discharge her skilled nursing facility  LOS: 6 days    Danielle [name] N 04/06/2013, 10:57 AM

## 2013-04-09 ENCOUNTER — Non-Acute Institutional Stay (SKILLED_NURSING_FACILITY): Payer: Medicare Other | Admitting: Internal Medicine

## 2013-04-09 ENCOUNTER — Encounter: Payer: Self-pay | Admitting: Internal Medicine

## 2013-04-09 DIAGNOSIS — IMO0002 Reserved for concepts with insufficient information to code with codable children: Secondary | ICD-10-CM

## 2013-04-09 DIAGNOSIS — I5022 Chronic systolic (congestive) heart failure: Secondary | ICD-10-CM

## 2013-04-09 DIAGNOSIS — F03918 Unspecified dementia, unspecified severity, with other behavioral disturbance: Secondary | ICD-10-CM | POA: Insufficient documentation

## 2013-04-09 DIAGNOSIS — N390 Urinary tract infection, site not specified: Secondary | ICD-10-CM | POA: Insufficient documentation

## 2013-04-09 DIAGNOSIS — R627 Adult failure to thrive: Secondary | ICD-10-CM

## 2013-04-09 DIAGNOSIS — E039 Hypothyroidism, unspecified: Secondary | ICD-10-CM

## 2013-04-09 DIAGNOSIS — I509 Heart failure, unspecified: Secondary | ICD-10-CM

## 2013-04-09 DIAGNOSIS — F0391 Unspecified dementia with behavioral disturbance: Secondary | ICD-10-CM | POA: Insufficient documentation

## 2013-04-09 DIAGNOSIS — S42309D Unspecified fracture of shaft of humerus, unspecified arm, subsequent encounter for fracture with routine healing: Secondary | ICD-10-CM

## 2013-04-09 DIAGNOSIS — S42412D Displaced simple supracondylar fracture without intercondylar fracture of left humerus, subsequent encounter for fracture with routine healing: Secondary | ICD-10-CM | POA: Insufficient documentation

## 2013-04-09 DIAGNOSIS — I2510 Atherosclerotic heart disease of native coronary artery without angina pectoris: Secondary | ICD-10-CM

## 2013-04-09 DIAGNOSIS — F0390 Unspecified dementia without behavioral disturbance: Secondary | ICD-10-CM

## 2013-04-09 NOTE — Assessment & Plan Note (Signed)
Cloudy urine noted - e coli-tx with cipro until 1/3

## 2013-04-09 NOTE — Assessment & Plan Note (Signed)
Presentation to ED; brought by relative because she can't take care of herself any longer

## 2013-04-09 NOTE — Assessment & Plan Note (Signed)
Had no CP during hospitalization

## 2013-04-09 NOTE — Assessment & Plan Note (Signed)
ACE, lopressor, lasic ASA, xarelto

## 2013-04-09 NOTE — Assessment & Plan Note (Signed)
Not new onset but newly appreciated; cause of failure to thrive and SNF placement

## 2013-04-09 NOTE — Assessment & Plan Note (Signed)
Mentioned as problem during hosp-on no meds-can't find a TSH - will ordee

## 2013-04-09 NOTE — Progress Notes (Signed)
MRN: [id number] Name: Danielle [name]  Sex: female Age: 80 y.o. DOB: October 15, 1916  [facility] #: [facility] Facility/Room: 312A Level Of Care: SNF Provider: Merrilee Seashore D Emergency Contacts: Extended Emergency Contact Information Primary Emergency Contact: [name],[name] Address: [address]          [place] [address] [place] Home Phone: [phone] Relation: Daughter  Code Status:   Allergies: Keflex and Penicillins  Chief Complaint  Patient presents with  . nursing home admission    HPI: Patient is 80 y.o. female who is being admitted to SNF for dementia and failure to thrive.  Past Medical History  Diagnosis Date  . Coronary artery disease   . CHF (congestive heart failure)   . Hypertension   . Anemia     acute blood loss from hip surgery 11/2012    Past Surgical History  Procedure Laterality Date  . Femur im nail Left 11/09/2012    Procedure: INTRAMEDULLARY (IM) NAIL FEMORAL/Left;  Surgeon: Toni Arthurs, MD;  Location: [facility] OR;  Service: Orthopedics;  Laterality: Left;      Medication List       This list is accurate as of: 04/09/13 10:11 PM.  Always use your most recent med list.               aspirin EC 81 MG tablet  Take 81 mg by mouth daily.     ciprofloxacin 500 MG tablet  Commonly known as:  CIPRO  Take 500 mg by mouth 2 (two) times daily.     collagenase ointment  Commonly known as:  SANTYL  Apply 1 application topically daily.     furosemide 20 MG tablet  Commonly known as:  LASIX  Take 20 mg by mouth 2 (two) times daily.     Iron 325 (65 FE) MG Tabs  Take 1 tablet by mouth 2 (two) times daily.     ketorolac 0.5 % ophthalmic solution  Commonly known as:  ACULAR  Place 1 drop into the right eye 4 (four) times daily.     lisinopril 20 MG tablet  Commonly known as:  PRINIVIL,ZESTRIL  Take 1 tablet (20 mg total) by mouth daily.     metoprolol tartrate 25 MG tablet  Commonly known as:  LOPRESSOR  Take 25 mg by mouth 2  (two) times daily.     multivitamin with minerals tablet  Take 1 tablet by mouth daily.     nitroGLYCERIN 0.4 MG SL tablet  Commonly known as:  NITROSTAT  Place 0.4 mg under the tongue every 5 (five) minutes as needed for chest pain.     Rivaroxaban 20 MG Tabs tablet  Commonly known as:  XARELTO  Take 1 tablet (20 mg total) by mouth daily.     vitamin C 500 MG tablet  Commonly known as:  ASCORBIC ACID  Take 500 mg by mouth daily.        Meds ordered this encounter  Medications  . ciprofloxacin (CIPRO) 500 MG tablet    Sig: Take 500 mg by mouth 2 (two) times daily.     There is no immunization history on file for this patient.  History  Substance Use Topics  . Smoking status: Never Smoker   . Smokeless tobacco: Never Used  . Alcohol Use: No    Family history is noncontributory    Review of Systems not reliable due to dementia-when reminded pt remembers her elbow hurts   Filed Vitals:   04/09/13 1032  BP: 98/66  Pulse: 84  Temp: 98.6 F (37 C)  Resp: 22    Physical Exam  GENERAL APPEARANCE: Alert, mod conversant. Appropriately groomed. No acute distress; eating, NAD  SKIN: No diaphoresis rash HEAD: Normocephalic, atraumatic  EYES: Conjunctiva/lids clear. Pupils round, reactive. EOMs intact.  EARS: External exam WNL, canals clear. Hearing grossly normal.  NOSE: No deformity or discharge.  MOUTH/THROAT: Lips w/o lesions.  RESPIRATORY: Breathing is even, unlabored. Lung sounds are clear   CARDIOVASCULAR: Heart RRR no murmurs, rubs or gallops. No peripheral edema.   GASTROINTESTINAL: Abdomen is soft, non-tender, not distended w/ normal bowel sounds. GENITOURINARY: Bladder non tender, not distended  MUSCULOSKELETAL: No abnormal joints or musculature NEUROLOGIC: Oriented X3. Cranial nerves 2-12 grossly intact. Moves all extremities no tremor. PSYCHIATRIC: Dementia, no behavioral issues  Patient Active Problem List   Diagnosis Date Noted  . Closed  supracondylar fracture of left elbow with routine healing 04/09/2013  . UTI (urinary tract infection) 04/09/2013  . Dementia without behavioral disturbance 04/09/2013  . Failure to thrive 03/31/2013  . DNR (do not resuscitate) 03/31/2013  . Encounter for long-term (current) use of other medications 01/11/2013  . Acute venous embolism and thrombosis of deep vessels of distal lower extremity 12/30/2012  . Unspecified hypothyroidism 12/23/2012  . Chronic systolic congestive heart failure 12/23/2012  . Acute posthemorrhagic anemia 12/09/2012  . Unspecified constipation 11/23/2012  . Intertrochanteric fracture of left hip 11/16/2012  . Coronary artery disease   . Hypertension   . Anemia     CBC    Component Value Date/Time   WBC 7.8 04/05/2013 0630   RBC 3.33* 04/05/2013 0630   HGB 10.5* 04/05/2013 0630   HCT 30.6* 04/05/2013 0630   PLT 204 04/05/2013 0630   MCV 91.9 04/05/2013 0630   LYMPHSABS 1.2 03/31/2013 1648   MONOABS 1.1* 03/31/2013 1648   EOSABS 0.0 03/31/2013 1648   BASOSABS 0.0 03/31/2013 1648    CMP     Component Value Date/Time   NA 136 04/05/2013 0630   K 3.6 04/05/2013 0630   CL 100 04/05/2013 0630   CO2 26 04/05/2013 0630   GLUCOSE 122* 04/05/2013 0630   BUN 23 04/05/2013 0630   CREATININE 0.91 04/05/2013 0630   CALCIUM 7.9* 04/05/2013 0630   PROT 6.3 03/31/2013 1648   ALBUMIN 2.9* 03/31/2013 1648   AST 52* 03/31/2013 1648   ALT 23 03/31/2013 1648   ALKPHOS 90 03/31/2013 1648   BILITOT 0.9 03/31/2013 1648   GFRNONAA 52* 04/05/2013 0630   GFRAA 61* 04/05/2013 0630    Assessment and Plan  Failure to thrive Presentation to ED; brought by relative because she can't take care of herself any longer  Coronary artery disease Had no CP during hospitalization  Closed supracondylar fracture of left elbow with routine healing Discovered from pt c/o pain-was splinted by ortho and f/u with ortho  UTI (urinary tract infection) Cloudy urine noted - e coli-tx  with cipro until 1/3  Chronic systolic congestive heart failure ACE, lopressor, lasic ASA, xarelto  Unspecified hypothyroidism Mentioned as problem during hosp-on no meds-can't find a TSH - will ordee  Dementia without behavioral disturbance Not new onset but newly appreciated; cause of failure to thrive and SNF placement    Margit HanksALEXANDER, Ameliya Nicotra D, MD

## 2013-04-09 NOTE — Assessment & Plan Note (Signed)
Discovered from pt c/o pain-was splinted by ortho and f/u with ortho

## 2013-04-18 ENCOUNTER — Encounter: Payer: Self-pay | Admitting: Internal Medicine

## 2013-04-27 ENCOUNTER — Non-Acute Institutional Stay (SKILLED_NURSING_FACILITY): Payer: Medicare Other | Admitting: Nurse Practitioner

## 2013-04-27 DIAGNOSIS — I5022 Chronic systolic (congestive) heart failure: Secondary | ICD-10-CM

## 2013-04-27 DIAGNOSIS — I509 Heart failure, unspecified: Secondary | ICD-10-CM

## 2013-04-27 NOTE — Progress Notes (Signed)
Patient ID: Danielle [name], female   DOB: 04/01/1917, 80 y.o.   MRN: [id number]    Nursing [facility]   Place of Service: SNF (31)  PCP: Pola CornSPRUILL,JEROME O, MD  Allergies  Allergen Reactions  . Keflex [Cephalexin] Itching  . Penicillins Itching    Chief Complaint  Patient presents with  . Acute Visit    weight gain    HPI:  80 year old female being seen today at the request of nursing; pt with a PMH of CHF who has had a noted 16 lbs in 1 week; staff reports pt complaining of leg pain constantly and has pitting edema No fevers or chills, pt with no increase work of breathing but reports shortness of breath at times Takes lasix 20 mg BID currently  Review of Systems:  Review of Systems  Constitutional: Negative for fever, chills and malaise/fatigue.  Respiratory: Positive for shortness of breath (at times). Negative for cough.   Cardiovascular: Positive for leg swelling. Negative for chest pain.  Musculoskeletal: Positive for myalgias (in legs).     Past Medical History  Diagnosis Date  . Coronary artery disease   . CHF (congestive heart failure)   . Hypertension   . Anemia     acute blood loss from hip surgery 11/2012   Past Surgical History  Procedure Laterality Date  . Femur im nail Left 11/09/2012    Procedure: INTRAMEDULLARY (IM) NAIL FEMORAL/Left;  Surgeon: Toni ArthursJohn Hewitt, MD;  Location: [facility] OR;  Service: Orthopedics;  Laterality: Left;   Social History:   reports that she has never smoked. She has never used smokeless tobacco. She reports that she does not drink alcohol. Her drug history is not on file.  No family history on file.  Medications: Patient's Medications  New Prescriptions   No medications on file  Previous Medications   ASPIRIN EC 81 MG TABLET    Take 81 mg by mouth daily.   COLLAGENASE (SANTYL) OINTMENT    Apply 1 application topically daily.   FERROUS SULFATE (IRON) 325 (65 FE) MG TABS    Take 1 tablet by mouth 2  (two) times daily.   FUROSEMIDE (LASIX) 20 MG TABLET    Take 20 mg by mouth 2 (two) times daily.    KETOROLAC (ACULAR) 0.5 % OPHTHALMIC SOLUTION    Place 1 drop into the right eye 4 (four) times daily.   LISINOPRIL (PRINIVIL,ZESTRIL) 20 MG TABLET    Take 1 tablet (20 mg total) by mouth daily.   METOPROLOL TARTRATE (LOPRESSOR) 25 MG TABLET    Take 25 mg by mouth 2 (two) times daily.   MULTIPLE VITAMINS-MINERALS (MULTIVITAMIN WITH MINERALS) TABLET    Take 1 tablet by mouth daily.   NITROGLYCERIN (NITROSTAT) 0.4 MG SL TABLET    Place 0.4 mg under the tongue every 5 (five) minutes as needed for chest pain.   RIVAROXABAN (XARELTO) 20 MG TABS TABLET    Take 1 tablet (20 mg total) by mouth daily.   VITAMIN C (ASCORBIC ACID) 500 MG TABLET    Take 500 mg by mouth daily.  Modified Medications   No medications on file  Discontinued Medications   No medications on file     Physical Exam: Physical Exam  Constitutional: She is well-developed, well-nourished, and in no distress.  Neck: Normal range of motion. Neck supple. No JVD present. No thyromegaly present.  No JVD but neck veins are pronounced   Cardiovascular: Normal rate, regular rhythm  and normal heart sounds.   Pulmonary/Chest: Effort normal and breath sounds normal. No respiratory distress.  Abdominal: Soft. Bowel sounds are normal. She exhibits no distension. There is no tenderness.  Musculoskeletal: She exhibits edema (3+ pitting and weeping edema).  Neurological: She is alert.  Skin: Skin is warm and dry.  Psychiatric: Affect normal.    Filed Vitals:   04/27/13 1736  BP: 120/74  Pulse: 80  Temp: 98 F (36.7 C)  Resp: 20  SpO2: 98%      Labs reviewed: Basic Metabolic Panel:  Recent Labs  16/10/96 1648 04/02/13 0449 04/05/13 0630  NA 144 142 136  K 3.6 3.4* 3.6  CL 105 106 100  CO2 26 28 26   GLUCOSE 84 113* 122*  BUN 29* 26* 23  CREATININE 0.94 0.97 0.91  CALCIUM 8.7 7.8* 7.9*   Liver Function Tests:  Recent  Labs  11/08/12 1851 03/31/13 1648  AST 58* 52*  ALT 16 23  ALKPHOS 85 90  BILITOT 0.7 0.9  PROT 7.7 6.3  ALBUMIN 3.5 2.9*    Recent Labs  03/31/13 1648  LIPASE 18   No results found for this basename: AMMONIA,  in the last 8760 hours CBC:  Recent Labs  11/08/12 1851  03/31/13 1648 04/02/13 0449 04/05/13 0630  WBC 12.4*  < > 9.3 9.2 7.8  NEUTROABS 9.9*  --  7.0  --   --   HGB 11.0*  < > 11.3* 10.1* 10.5*  HCT 31.0*  < > 33.7* 30.1* 30.6*  MCV 88.6  < > 91.8 93.2 91.9  PLT 195  < > 189 153 204  < > = values in this interval not displayed. Cardiac Enzymes:  Recent Labs  11/08/12 1851  04/03/13 0730 04/03/13 1140 04/03/13 1800  CKTOTAL 1920*  --   --   --   --   TROPONINI  --   < > <0.30 <0.30 <0.30  < > = values in this interval not displayed.   Assessment/Plan 1. Chronic systolic congestive heart failure -with a 16 lbs weight gain in 1 week and worsening LE edema -will increase lasix; staff to give 40 lasix now; will increase to 60 mg lasix in am and 40 mg lasix at 2 pm for 3 days then will start lasix 40 mg BID  -KCL 40 meq PO BID -will get BMP now and again in 1 week (staff to run STAT)  - to keep legs elevated as much as possible

## 2013-05-03 ENCOUNTER — Encounter (HOSPITAL_COMMUNITY): Payer: Self-pay | Admitting: Emergency Medicine

## 2013-05-03 ENCOUNTER — Emergency Department (HOSPITAL_COMMUNITY): Payer: Medicare Other

## 2013-05-03 ENCOUNTER — Inpatient Hospital Stay (HOSPITAL_COMMUNITY)
Admission: EM | Admit: 2013-05-03 | Discharge: 2013-05-06 | DRG: 689 | Disposition: A | Discharge status: Skilled Nursing Facility | Payer: Medicare Other | Attending: Internal Medicine | Admitting: Internal Medicine

## 2013-05-03 DIAGNOSIS — Z79899 Other long term (current) drug therapy: Secondary | ICD-10-CM

## 2013-05-03 DIAGNOSIS — Z66 Do not resuscitate: Secondary | ICD-10-CM | POA: Diagnosis present

## 2013-05-03 DIAGNOSIS — E870 Hyperosmolality and hypernatremia: Secondary | ICD-10-CM | POA: Diagnosis present

## 2013-05-03 DIAGNOSIS — I10 Essential (primary) hypertension: Secondary | ICD-10-CM | POA: Diagnosis present

## 2013-05-03 DIAGNOSIS — F03918 Unspecified dementia, unspecified severity, with other behavioral disturbance: Secondary | ICD-10-CM | POA: Diagnosis present

## 2013-05-03 DIAGNOSIS — Z7982 Long term (current) use of aspirin: Secondary | ICD-10-CM

## 2013-05-03 DIAGNOSIS — G9340 Encephalopathy, unspecified: Secondary | ICD-10-CM | POA: Diagnosis present

## 2013-05-03 DIAGNOSIS — N179 Acute kidney failure, unspecified: Secondary | ICD-10-CM

## 2013-05-03 DIAGNOSIS — F0390 Unspecified dementia without behavioral disturbance: Secondary | ICD-10-CM

## 2013-05-03 DIAGNOSIS — IMO0001 Reserved for inherently not codable concepts without codable children: Secondary | ICD-10-CM

## 2013-05-03 DIAGNOSIS — Z515 Encounter for palliative care: Secondary | ICD-10-CM

## 2013-05-03 DIAGNOSIS — E039 Hypothyroidism, unspecified: Secondary | ICD-10-CM | POA: Diagnosis present

## 2013-05-03 DIAGNOSIS — R627 Adult failure to thrive: Secondary | ICD-10-CM | POA: Diagnosis present

## 2013-05-03 DIAGNOSIS — I5023 Acute on chronic systolic (congestive) heart failure: Secondary | ICD-10-CM | POA: Diagnosis present

## 2013-05-03 DIAGNOSIS — IMO0002 Reserved for concepts with insufficient information to code with codable children: Secondary | ICD-10-CM

## 2013-05-03 DIAGNOSIS — N390 Urinary tract infection, site not specified: Principal | ICD-10-CM | POA: Diagnosis present

## 2013-05-03 DIAGNOSIS — I509 Heart failure, unspecified: Secondary | ICD-10-CM | POA: Diagnosis present

## 2013-05-03 DIAGNOSIS — E860 Dehydration: Secondary | ICD-10-CM

## 2013-05-03 DIAGNOSIS — I2510 Atherosclerotic heart disease of native coronary artery without angina pectoris: Secondary | ICD-10-CM | POA: Diagnosis present

## 2013-05-03 DIAGNOSIS — E875 Hyperkalemia: Secondary | ICD-10-CM | POA: Diagnosis present

## 2013-05-03 DIAGNOSIS — R4182 Altered mental status, unspecified: Secondary | ICD-10-CM | POA: Diagnosis present

## 2013-05-03 DIAGNOSIS — I4891 Unspecified atrial fibrillation: Secondary | ICD-10-CM

## 2013-05-03 DIAGNOSIS — M1990 Unspecified osteoarthritis, unspecified site: Secondary | ICD-10-CM | POA: Diagnosis present

## 2013-05-03 DIAGNOSIS — I5022 Chronic systolic (congestive) heart failure: Secondary | ICD-10-CM

## 2013-05-03 DIAGNOSIS — F0391 Unspecified dementia with behavioral disturbance: Secondary | ICD-10-CM | POA: Diagnosis present

## 2013-05-03 DIAGNOSIS — T68XXXA Hypothermia, initial encounter: Secondary | ICD-10-CM

## 2013-05-03 DIAGNOSIS — R531 Weakness: Secondary | ICD-10-CM

## 2013-05-03 DIAGNOSIS — R680 Hypothermia, not associated with low environmental temperature: Secondary | ICD-10-CM | POA: Diagnosis present

## 2013-05-03 DIAGNOSIS — D638 Anemia in other chronic diseases classified elsewhere: Secondary | ICD-10-CM | POA: Diagnosis present

## 2013-05-03 HISTORY — DX: Unspecified atrial fibrillation: I48.91

## 2013-05-03 LAB — URINALYSIS, ROUTINE W REFLEX MICROSCOPIC
Bilirubin Urine: NEGATIVE
Glucose, UA: NEGATIVE mg/dL
Hgb urine dipstick: NEGATIVE
Ketones, ur: NEGATIVE mg/dL
Leukocytes, UA: NEGATIVE
Nitrite: NEGATIVE
Protein, ur: 30 mg/dL — AB
Specific Gravity, Urine: >1.03 — ABNORMAL HIGH (ref 1.005–1.030)
Urobilinogen, UA: 0.2 mg/dL (ref 0.0–1.0)
pH: 5 (ref 5.0–8.0)

## 2013-05-03 LAB — CBC WITH DIFFERENTIAL/PLATELET
Basophils Absolute: 0 10*3/uL (ref 0.0–0.1)
Basophils Relative: 0 % (ref 0–1)
EOS ABS: 0 10*3/uL (ref 0.0–0.7)
Eosinophils Relative: 1 % (ref 0–5)
HCT: 33.4 % — ABNORMAL LOW (ref 36.0–46.0)
Hemoglobin: 11.1 g/dL — ABNORMAL LOW (ref 12.0–15.0)
Lymphocytes Relative: 30 % (ref 12–46)
Lymphs Abs: 1.9 10*3/uL (ref 0.7–4.0)
MCH: 31.7 pg (ref 26.0–34.0)
MCHC: 33.2 g/dL (ref 30.0–36.0)
MCV: 95.4 fL (ref 78.0–100.0)
Monocytes Absolute: 0.8 10*3/uL (ref 0.1–1.0)
Monocytes Relative: 13 % — ABNORMAL HIGH (ref 3–12)
NEUTROS PCT: 57 % (ref 43–77)
Neutro Abs: 3.5 10*3/uL (ref 1.7–7.7)
PLATELETS: 370 10*3/uL (ref 150–400)
RBC: 3.5 MIL/uL — ABNORMAL LOW (ref 3.87–5.11)
RDW: 18 % — AB (ref 11.5–15.5)
WBC: 6.2 10*3/uL (ref 4.0–10.5)

## 2013-05-03 LAB — COMPREHENSIVE METABOLIC PANEL
ALT: 59 U/L — ABNORMAL HIGH (ref 0–35)
AST: 84 U/L — AB (ref 0–37)
Albumin: 3.6 g/dL (ref 3.5–5.2)
Alkaline Phosphatase: 187 U/L — ABNORMAL HIGH (ref 39–117)
BUN: 57 mg/dL — AB (ref 6–23)
CALCIUM: 9.3 mg/dL (ref 8.4–10.5)
CO2: 24 meq/L (ref 19–32)
CREATININE: 1.94 mg/dL — AB (ref 0.50–1.10)
Chloride: 103 mEq/L (ref 96–112)
GFR calc Af Amer: 24 mL/min — ABNORMAL LOW (ref >90)
GFR, EST NON AFRICAN AMERICAN: 21 mL/min — AB (ref >90)
Glucose, Bld: 89 mg/dL (ref 70–99)
Potassium: 5.6 mEq/L — ABNORMAL HIGH (ref 3.7–5.3)
Sodium: 144 mEq/L (ref 137–147)
Total Bilirubin: 1.3 mg/dL — ABNORMAL HIGH (ref 0.3–1.2)
Total Protein: 7.9 g/dL (ref 6.0–8.3)

## 2013-05-03 LAB — POCT I-STAT, CHEM 8
BUN: 53 mg/dL — ABNORMAL HIGH (ref 6–23)
CHLORIDE: 107 meq/L (ref 96–112)
Calcium, Ion: 1.07 mmol/L — ABNORMAL LOW (ref 1.13–1.30)
Creatinine, Ser: 2 mg/dL — ABNORMAL HIGH (ref 0.50–1.10)
Glucose, Bld: 86 mg/dL (ref 70–99)
HCT: 38 % (ref 36.0–46.0)
Hemoglobin: 12.9 g/dL (ref 12.0–15.0)
POTASSIUM: 5.3 meq/L (ref 3.7–5.3)
SODIUM: 143 meq/L (ref 137–147)
TCO2: 25 mmol/L (ref 0–100)

## 2013-05-03 LAB — HEPARIN LEVEL (UNFRACTIONATED): Heparin Unfractionated: >2.2 IU/mL — ABNORMAL HIGH (ref 0.30–0.70)

## 2013-05-03 LAB — APTT: aPTT: 43 seconds — ABNORMAL HIGH (ref 24–37)

## 2013-05-03 LAB — TROPONIN I

## 2013-05-03 LAB — URINE MICROSCOPIC-ADD ON

## 2013-05-03 LAB — PRO B NATRIURETIC PEPTIDE: PRO B NATRI PEPTIDE: 9837 pg/mL — AB (ref 0–450)

## 2013-05-03 LAB — MRSA PCR SCREENING: MRSA by PCR: POSITIVE — AB

## 2013-05-03 LAB — TSH: TSH: 3.864 u[IU]/mL (ref 0.350–4.500)

## 2013-05-03 LAB — VITAMIN B12: VITAMIN B 12: 1466 pg/mL — AB (ref 211–911)

## 2013-05-03 LAB — AMMONIA: Ammonia: 49 umol/L (ref 11–60)

## 2013-05-03 MED ORDER — COLLAGENASE 250 UNIT/GM EX OINT
1.0000 "application " | TOPICAL_OINTMENT | Freq: Every day | CUTANEOUS | Status: DC
  Administered 2013-05-03 – 2013-05-06 (×4): 1 via TOPICAL
  Filled 2013-05-03 (×2): qty 30

## 2013-05-03 MED ORDER — SODIUM CHLORIDE 0.9 % IV SOLN
INTRAVENOUS | Status: DC
  Administered 2013-05-03: 50 mL/h via INTRAVENOUS

## 2013-05-03 MED ORDER — ASPIRIN EC 81 MG PO TBEC
81.0000 mg | DELAYED_RELEASE_TABLET | Freq: Every day | ORAL | Status: DC
  Administered 2013-05-04 – 2013-05-06 (×3): 81 mg via ORAL
  Filled 2013-05-03 (×4): qty 1

## 2013-05-03 MED ORDER — FERROUS SULFATE 325 (65 FE) MG PO TABS
325.0000 mg | ORAL_TABLET | Freq: Two times a day (BID) | ORAL | Status: DC
  Administered 2013-05-04 – 2013-05-06 (×5): 325 mg via ORAL
  Filled 2013-05-03 (×8): qty 1

## 2013-05-03 MED ORDER — HEPARIN (PORCINE) IN NACL 100-0.45 UNIT/ML-% IJ SOLN
800.0000 [IU]/h | INTRAMUSCULAR | Status: DC
  Administered 2013-05-03: 800 [IU]/h via INTRAVENOUS
  Filled 2013-05-03: qty 250

## 2013-05-03 MED ORDER — ONDANSETRON HCL 4 MG/2ML IJ SOLN: 4.0000 mg | Freq: Four times a day (QID) | INTRAMUSCULAR | Status: DC | PRN

## 2013-05-03 MED ORDER — ONDANSETRON HCL 4 MG PO TABS: 4.0000 mg | ORAL_TABLET | Freq: Four times a day (QID) | ORAL | Status: DC | PRN

## 2013-05-03 MED ORDER — SODIUM CHLORIDE 0.9 % IV SOLN: INTRAVENOUS | Status: AC

## 2013-05-03 MED ORDER — CHLORHEXIDINE GLUCONATE CLOTH 2 % EX PADS
6.0000 | MEDICATED_PAD | Freq: Every day | CUTANEOUS | Status: DC
  Administered 2013-05-03 – 2013-05-05 (×3): 6 via TOPICAL

## 2013-05-03 MED ORDER — KETOROLAC TROMETHAMINE 0.5 % OP SOLN
1.0000 [drp] | Freq: Four times a day (QID) | OPHTHALMIC | Status: DC
  Administered 2013-05-03 – 2013-05-06 (×10): 1 [drp] via OPHTHALMIC
  Filled 2013-05-03 (×3): qty 3

## 2013-05-03 MED ORDER — HEPARIN (PORCINE) IN NACL 100-0.45 UNIT/ML-% IJ SOLN
800.0000 [IU]/h | INTRAMUSCULAR | Status: DC
  Filled 2013-05-03: qty 250

## 2013-05-03 MED ORDER — LORAZEPAM 2 MG/ML IJ SOLN
1.0000 mg | Freq: Once | INTRAMUSCULAR | Status: AC
  Administered 2013-05-03: 1 mg via INTRAVENOUS
  Filled 2013-05-03: qty 1

## 2013-05-03 MED ORDER — MUPIROCIN 2 % EX OINT
TOPICAL_OINTMENT | Freq: Two times a day (BID) | CUTANEOUS | Status: DC
  Administered 2013-05-03 – 2013-05-06 (×7): via NASAL
  Filled 2013-05-03 (×3): qty 22

## 2013-05-03 MED ORDER — ACETAMINOPHEN 325 MG PO TABS: 650.0000 mg | ORAL_TABLET | Freq: Four times a day (QID) | ORAL | Status: DC | PRN

## 2013-05-03 MED ORDER — ONDANSETRON HCL 4 MG/2ML IJ SOLN: 4.0000 mg | Freq: Three times a day (TID) | INTRAMUSCULAR | Status: AC | PRN

## 2013-05-03 MED ORDER — FUROSEMIDE 10 MG/ML IJ SOLN
20.0000 mg | Freq: Every day | INTRAMUSCULAR | Status: DC
  Administered 2013-05-03 – 2013-05-04 (×2): 20 mg via INTRAVENOUS
  Filled 2013-05-03 (×2): qty 2

## 2013-05-03 MED ORDER — RIVAROXABAN 20 MG PO TABS: 20.0000 mg | ORAL_TABLET | Freq: Every day | ORAL | Status: DC

## 2013-05-03 MED ORDER — DOCUSATE SODIUM 100 MG PO CAPS
100.0000 mg | ORAL_CAPSULE | Freq: Two times a day (BID) | ORAL | Status: DC
  Administered 2013-05-04 – 2013-05-05 (×4): 100 mg via ORAL
  Filled 2013-05-03 (×7): qty 1

## 2013-05-03 MED ORDER — NITROGLYCERIN 0.4 MG SL SUBL: 0.4000 mg | SUBLINGUAL_TABLET | SUBLINGUAL | Status: DC | PRN

## 2013-05-03 MED ORDER — METOPROLOL SUCCINATE ER 25 MG PO TB24
25.0000 mg | ORAL_TABLET | Freq: Two times a day (BID) | ORAL | Status: DC
  Filled 2013-05-03 (×2): qty 1

## 2013-05-03 MED ORDER — METOPROLOL TARTRATE 1 MG/ML IV SOLN
5.0000 mg | Freq: Three times a day (TID) | INTRAVENOUS | Status: DC
  Administered 2013-05-03 – 2013-05-04 (×3): 5 mg via INTRAVENOUS
  Filled 2013-05-03 (×6): qty 5

## 2013-05-03 MED ORDER — SODIUM CHLORIDE 0.9 % IV SOLN
Freq: Once | INTRAVENOUS | Status: AC
  Administered 2013-05-03: 75 mL/h via INTRAVENOUS

## 2013-05-03 NOTE — ED Notes (Addendum)
Dr Sunnie Nielsenegalado at bedside.  Pt awoken to assess.  Responds to movement.  Keeps stating she wants to get out of bed, but no other purposeful conversation.  Dr Sunnie Nielsenegalado does not know why pt needs TLSO brace.

## 2013-05-03 NOTE — Progress Notes (Signed)
ANTICOAGULATION CONSULT NOTE  Pharmacy Consult for heparin Indication: atrial fibrillation  Allergies  Allergen Reactions  . Keflex [Cephalexin] Itching  . Penicillins Itching    Patient Measurements: Height: 5\' 2"  (157.5 cm) Weight: 151 lb 0.2 oz (68.5 kg) IBW/kg (Calculated) : 50.1 Heparin Dosing Weight: 64 kg  Vital Signs: Temp: 97.1 F (36.2 C) (01/26 1900) Temp src: Axillary (01/26 1608) BP: 120/69 mmHg (01/26 1900) Pulse Rate: 78 (01/26 1900)  Labs:  Recent Labs  05/03/13 0229 05/03/13 0238 05/03/13 1228 05/03/13 1855  HGB 11.1* 12.9  --   --   HCT 33.4* 38.0  --   --   PLT 370  --   --   --   APTT  --   --   --  43*  HEPARINUNFRC  --   --   --  >2.20*  CREATININE 1.94* 2.00*  --   --   TROPONINI  --   --  <0.30  --     Estimated Creatinine Clearance: 15.6 ml/min (by C-G formula based on Cr of 2).  Assessment: [facility] 80 [facility] xarelto PTA for afib and is now to start heparin while Xarelto is on hold d/t renal insufficiency.  Nursing MAR indicated that last dose of xarelto was given on 1/25 at 8 PM. An aPTT and heparin level were drawn this evening around 24 hours after patient's last Xarelto dose in order to assess patient's baseline. Heparin level extremely elevated (to be expected as Xarelto affects this) and aPTT is below goal at 43 seconds. No bleeding noted. Palliative care meeting with family tomorrow morning.  Goal of Therapy:  Heparin level 0.3-0.7 units/ml; aPTT 66-102 Monitor platelets by anticoagulation protocol: Yes   Plan:  1. start heparin drip at 800 units/hr (no bolus) 2. Daily heparin level, aPTT and CBC while patient continues on heparin 3. Follow GOC and plans for long term anticoagulation  Taylour Lietzke D. Ernesteen Mihalic, PharmD, BCPS Clinical Pharmacist Pager: [phone] 05/03/2013 8:36 PM

## 2013-05-03 NOTE — Consult Note (Signed)
Reason for Consult: CHF/atrial fibrillation Referring Physician: [facility]  Danielle [name] is an 80 y.o. female.  HPI: Patient is 80 year old female with past medical history significant for multiple medical problems i.e. coronary artery disease, history of congestive heart failure secondary to systolic and diastolic dysfunction, hypertension, hypothyroidism, and degenerative disease, history of hip fracture and left elbow fracture, anemia of chronic disease, dementia, history of paroxysmal atrial fibrillation, was admitted today because of increasing confusion and failure to thrive and was noted to have acute renal failure with hyperkalemia. Patient presently is lethargic and no meaningful history could be obtained. Moaning to painful stimuli. Patient did receive Ativan earlier today. Patient is hypothermic sepsis workup is being done by primary team.  Past Medical History  Diagnosis Date  . Coronary artery disease   . CHF (congestive heart failure)   . Hypertension   . Anemia     acute blood loss from hip surgery 11/2012  . A-fib     Past Surgical History  Procedure Laterality Date  . Femur im nail Left 11/09/2012    Procedure: INTRAMEDULLARY (IM) NAIL FEMORAL/Left;  Surgeon: Wylene Simmer, MD;  Location: [facility];  Service: Orthopedics;  Laterality: Left;    No family history on file.  Social History:  reports that she has never smoked. She has never used smokeless tobacco. She reports that she does not drink alcohol. Her drug history is not on file.  Allergies:  Allergies  Allergen Reactions  . Keflex [Cephalexin] Itching  . Penicillins Itching    Medications: I have reviewed the patient's current medications.  Results for orders placed during the hospital encounter of 05/03/13 (from the past 48 hour(s))  CBC WITH DIFFERENTIAL     Status: Abnormal   Collection Time    05/03/13  2:29 AM      Result Value Range   WBC 6.2  4.0 - 10.5 K/uL   RBC 3.50 (*) 3.87 - 5.11 MIL/uL   Hemoglobin 11.1 (*) 12.0 - 15.0 g/dL   HCT 33.4 (*) 36.0 - 46.0 %   MCV 95.4  78.0 - 100.0 fL   MCH 31.7  26.0 - 34.0 pg   MCHC 33.2  30.0 - 36.0 g/dL   RDW 18.0 (*) 11.5 - 15.5 %   Platelets 370  150 - 400 K/uL   Neutrophils Relative % 57  43 - 77 %   Neutro Abs 3.5  1.7 - 7.7 K/uL   Lymphocytes Relative 30  12 - 46 %   Lymphs Abs 1.9  0.7 - 4.0 K/uL   Monocytes Relative 13 (*) 3 - 12 %   Monocytes Absolute 0.8  0.1 - 1.0 K/uL   Eosinophils Relative 1  0 - 5 %   Eosinophils Absolute 0.0  0.0 - 0.7 K/uL   Basophils Relative 0  0 - 1 %   Basophils Absolute 0.0  0.0 - 0.1 K/uL  PRO B NATRIURETIC PEPTIDE     Status: Abnormal   Collection Time    05/03/13  2:29 AM      Result Value Range   Pro B Natriuretic peptide (BNP) 9837.0 (*) 0 - 450 pg/mL  COMPREHENSIVE METABOLIC PANEL     Status: Abnormal   Collection Time    05/03/13  2:29 AM      Result Value Range   Sodium 144  137 - 147 mEq/L   Potassium 5.6 (*) 3.7 - 5.3 mEq/L   Chloride 103  96 - 112 mEq/L  CO2 24  19 - 32 mEq/L   Glucose, Bld 89  70 - 99 mg/dL   BUN 57 (*) 6 - 23 mg/dL   Creatinine, Ser 2.55 (*) 0.50 - 1.10 mg/dL   Calcium 9.3  8.4 - 25.8 mg/dL   Total Protein 7.9  6.0 - 8.3 g/dL   Albumin 3.6  3.5 - 5.2 g/dL   AST 84 (*) 0 - 37 U/L   ALT 59 (*) 0 - 35 U/L   Alkaline Phosphatase 187 (*) 39 - 117 U/L   Total Bilirubin 1.3 (*) 0.3 - 1.2 mg/dL   GFR calc non Af Amer 21 (*) >90 mL/min   GFR calc Af Amer 24 (*) >90 mL/min   Comment: (NOTE)     The eGFR has been calculated using the CKD EPI equation.     This calculation has not been validated in all clinical situations.     eGFR's persistently <90 mL/min signify possible Chronic Kidney     Disease.  POCT I-STAT, CHEM 8     Status: Abnormal   Collection Time    05/03/13  2:38 AM      Result Value Range   Sodium 143  137 - 147 mEq/L   Potassium 5.3  3.7 - 5.3 mEq/L   Chloride 107  96 - 112 mEq/L   BUN 53 (*) 6 - 23 mg/dL   Creatinine, Ser 9.48 (*) 0.50 -  1.10 mg/dL   Glucose, Bld 86  70 - 99 mg/dL   Calcium, Ion 3.47 (*) 1.13 - 1.30 mmol/L   TCO2 25  0 - 100 mmol/L   Hemoglobin 12.9  12.0 - 15.0 g/dL   HCT 58.3  07.4 - 60.0 %  URINALYSIS, ROUTINE W REFLEX MICROSCOPIC     Status: Abnormal   Collection Time    05/03/13  4:06 AM      Result Value Range   Color, Urine YELLOW  YELLOW   APPearance CLEAR  CLEAR   Specific Gravity, Urine >1.030 (*) 1.005 - 1.030   pH 5.0  5.0 - 8.0   Glucose, UA NEGATIVE  NEGATIVE mg/dL   Hgb urine dipstick NEGATIVE  NEGATIVE   Bilirubin Urine NEGATIVE  NEGATIVE   Ketones, ur NEGATIVE  NEGATIVE mg/dL   Protein, ur 30 (*) NEGATIVE mg/dL   Urobilinogen, UA 0.2  0.0 - 1.0 mg/dL   Nitrite NEGATIVE  NEGATIVE   Leukocytes, UA NEGATIVE  NEGATIVE  URINE MICROSCOPIC-ADD ON     Status: Abnormal   Collection Time    05/03/13  4:06 AM      Result Value Range   Squamous Epithelial / LPF RARE  RARE   WBC, UA 0-2  <3 WBC/hpf   Bacteria, UA RARE  RARE   Casts HYALINE CASTS (*) NEGATIVE   Urine-Other RARE YEAST     Comment: MICROSCOPIC EXAM PERFORMED ON UNCONCENTRATED URINE  MRSA PCR SCREENING     Status: Abnormal   Collection Time    05/03/13  9:12 AM      Result Value Range   MRSA by PCR POSITIVE (*) NEGATIVE   Comment:            The GeneXpert MRSA Assay (FDA     approved for NASAL specimens     only), is one component of a     comprehensive MRSA colonization     surveillance program. It is not     intended to diagnose MRSA     infection nor  to guide or     monitor treatment for     MRSA infections.     RESULT CALLED TO, READ BACK BY AND VERIFIED WITH:     Jamas Lav RN 11:00 05/03/13 ([email or web address])    Ct Head Wo Contrast  05/03/2013   CLINICAL DATA:  Altered mental status, combative.  EXAM: CT HEAD WITHOUT CONTRAST  TECHNIQUE: Contiguous axial images were obtained from the base of the skull through the vertex without intravenous contrast.  COMPARISON:  CT of the head November 08, 2012.  FINDINGS: Moderate to  severe ventriculomegaly, similar to prior examination with slight sulcal effacement of the convexities. No intraparenchymal hemorrhage, mass effect nor midline shift. Confluent supratentorial white matter hypodensities are unchanged. No acute large vascular territory infarcts. Bilateral patchy basal ganglia and the limits hypodensities.  No abnormal extra-axial fluid collections. Basal cisterns are patent. Severe calcific atherosclerosis of the carotid siphons and to lesser extent the vertebral arteries.  No skull fracture. Visualized paranasal sinuses and mastoid aircells are well-aerated. The included ocular globes and orbital contents are non-suspicious. Status post bilateral ocular lens implants.  IMPRESSION: No acute intracranial process.  Stable appearance of the head: Moderate to severe global brain atrophy, though there is mild effacement of the sulci at the convexities condyle which can be seen with normal pressure hydrocephalus. Severe white matter changes suggest chronic small vessel ischemic disease with patchy basal ganglia and thalamus hypodensities likely reflecting lacunar infarcts.   Electronically Signed   By: Elon Alas   On: 05/03/2013 03:01   Dg Chest Port 1 View  05/03/2013   CLINICAL DATA:  Shortness of breath, confusion.  EXAM: PORTABLE CHEST - 1 VIEW  COMPARISON:  11/08/2012  FINDINGS: Cardiac enlargement. Aortic atherosclerosis. Interstitial prominence, senescent change. No definite consolidation. Small right effusion, with blunted costophrenic angle. Limited osseous evaluation due to technique and osteopenia.  IMPRESSION: Blunted right costophrenic angle may reflect a small pleural effusion.  Prominent cardiac contour, similar to prior   Electronically Signed   By: Carlos Levering M.D.   On: 05/03/2013 03:03    Review of Systems  Unable to perform ROS: critical illness   Blood pressure 142/86, pulse 66, temperature 96.2 F (35.7 C), temperature source Rectal, resp. rate  18, height $RemoveBe'5\' 2"'cLvxMOmOA$  (1.575 m), weight 68.5 kg (151 lb 0.2 oz), SpO2 94.00%. Physical Exam  Eyes: Conjunctivae are normal. Left eye exhibits no discharge. No scleral icterus.  Neck: Normal range of motion. Neck supple. JVD present. No tracheal deviation present. No thyromegaly present.  Cardiovascular:  Irregularly irregular S1 and S2 soft there is soft systolic murmur and S3 gallop  Respiratory:  Decreased breath sound at bases with faint rales  GI: Soft. Bowel sounds are normal.  Musculoskeletal:  No clubbing cyanosis 3+ edema noted    Assessment/Plan Decompensated systolic heart failure New-onset A. fib with controlled ventricular response Altered mental status workup in progress rule out sepsis Hypertension Hypothyroidism Degenerative joint disease Chronic anemia History of hip fracture in the past and left supracondylar elbow fracture in the past Dementia Failure to thrive Acute renal failure Plan Continue present management Antibiotics per primary team Hold xarelto in view of worsening renal function Start heparin per pharmacy Prognosis poor discussed with family Add low-dose Lasix Check serial enzymes   Amesha Bailey N 05/03/2013, 12:39 PM

## 2013-05-03 NOTE — Progress Notes (Signed)
Orthopedic Tech Progress Note Patient Details:  Danielle [name] 08/04/1916 [id number] Received call from ED nurse stating patient had an old elbow injury and the doctor ordered a TLSO for patient. I explained to the nurse that a TLSO brace is actually a back brace. Nurse did not know why the doctor placed order for TLSO and stated she would follow up with doctor. Called nurse back to see if the patient did in fact need a TLSO for back or any other splint applied to elbow. Nurse stated she spoke with admitting doctor and he wasn't sure why the ordering doctor placed order for TLSO either, he also had no request for elbow splint at this time. No action required by Ortho at this time. Ortho Tech will continue to check with nursing once patient is moved to floor to ensure that patient's needs are taken care of.   Patient ID: Danielle LombardMary G [name], female   DOB: 06/08/1916, 80 y.o.   MRN: [id number]   Danielle [name] 05/03/2013, 8:47 AM

## 2013-05-03 NOTE — ED Notes (Signed)
Miller MD at bedside. 

## 2013-05-03 NOTE — Progress Notes (Signed)
Thank you for consulting the Palliative Medicine Team at [facility] to meet your patient's and family's needs.   The reason that you asked us to see your patient is for GOC.  We have scheduled your patient for a meeting: 1/27 11am  The Surrogate decision make is: Matilde SprangGlenda [name] (dtr) Contact information: [phone]  Other family members that need to be present: At [name]'s discretion    Your patient is able/unable to participate: unable  Yong ChannelAlicia Paytan Recine, NP Palliative Medicine Team Pager # [phone] Team Phone # [phone]

## 2013-05-03 NOTE — ED Notes (Signed)
Ortho paged. 

## 2013-05-03 NOTE — H&P (Signed)
[facility] History and Physical  Danielle [name] LZJ:[id number] DOB: 12-20-1916 DOA: 05/03/2013  Referring physician: Dr Sabra Heck.  PCP: Patricia Nettle, MD   Chief Complaint: AMS.   HPI: Danielle [name] is a 80 y.o. female with PMH history of congestive heart failure, hypertension and anemia after having hip surgery in August. She also had Left forearm fracture. According to the report of the paramedics the patient had blood work drawn and was found to have hyperkalemia with a potassium of 6.0 and acute renal failure with a creatinine of 1.88. Later during the evening the patient was found to be wandering the hallways in her wheelchair with altered mental status, saying nonsensical things and appeared altered. Patient is unable to give a clear history due to altered mental status. History was obtain from ED records.  Patient at time of my evaluation is sedated, move extremities at times. Patient received Ativan 1 mg in the ED because she was agitated. She had a CT head that was negative for acute intracranial process. She was found to be hypothermic. Cr at 2.0, K at 5.3. UA negative for infection. Chest X Ray; Blunted right costophrenic angle may reflect a small pleural effusion. Prominent cardiac contour, similar to prior  Review of Systems:  Unable to obtain due to AMS.   Past Medical History  Diagnosis Date  . Coronary artery disease   . CHF (congestive heart failure)   . Hypertension   . Anemia     acute blood loss from hip surgery 11/2012  . A-fib    Past Surgical History  Procedure Laterality Date  . Femur im nail Left 11/09/2012    Procedure: INTRAMEDULLARY (IM) NAIL FEMORAL/Left;  Surgeon: Wylene Simmer, MD;  Location: [place];  Service: Orthopedics;  Laterality: Left;   Social History:  reports that she has never smoked. She has never used smokeless tobacco. She reports that she does not drink alcohol. Her drug history is not on file.  Allergies  Allergen Reactions  . Keflex  [Cephalexin] Itching  . Penicillins Itching    No family history on file.   Prior to Admission medications   Medication Sig Start Date End Date Taking? Authorizing Provider  acetaminophen (TYLENOL) 325 MG tablet Take 650 mg by mouth every 6 (six) hours as needed for mild pain or moderate pain.   Yes Historical Provider, MD  aspirin EC 81 MG tablet Take 81 mg by mouth daily.   Yes Historical Provider, MD  Ferrous Sulfate (IRON) 325 (65 FE) MG TABS Take 1 tablet by mouth 2 (two) times daily. 11/16/12  Yes Birdie Riddle, MD  furosemide (LASIX) 20 MG tablet Take 20 mg by mouth 2 (two) times daily.    Yes Historical Provider, MD  lisinopril (PRINIVIL,ZESTRIL) 20 MG tablet Take 1 tablet (20 mg total) by mouth daily. 04/05/13  Yes Clent Demark, MD  metoprolol succinate (TOPROL-XL) 25 MG 24 hr tablet Take 25 mg by mouth 2 (two) times daily.   Yes Historical Provider, MD  Multiple Vitamins-Minerals (MULTIVITAMIN WITH MINERALS) tablet Take 1 tablet by mouth daily.   Yes Historical Provider, MD  nitroGLYCERIN (NITROSTAT) 0.4 MG SL tablet Place 0.4 mg under the tongue every 5 (five) minutes as needed for chest pain.   Yes Historical Provider, MD  Rivaroxaban (XARELTO) 20 MG TABS tablet Take 1 tablet (20 mg total) by mouth daily. 01/22/13  Yes Benjiman Core, NP  vitamin C (ASCORBIC ACID) 500 MG tablet Take 500 mg by mouth  daily.   Yes Historical Provider, MD  collagenase (SANTYL) ointment Apply 1 application topically daily.    Historical Provider, MD  ketorolac (ACULAR) 0.5 % ophthalmic solution Place 1 drop into the right eye 4 (four) times daily.    Historical Provider, MD  potassium chloride SA (K-DUR,KLOR-CON) 20 MEQ tablet Take 20 mEq by mouth 2 (two) times daily.    Historical Provider, MD   Physical Exam: Filed Vitals:   05/03/13 0407  BP:   Pulse:   Temp: 96.3 F (35.7 C)  Resp:     BP 151/104  Pulse 52  Temp(Src) 96.3 F (35.7 C) (Rectal)  Resp 18  SpO2 100%  General:   Appears calm and comfortable, sedated.  Eyes:  normal lids, irises & conjunctiva ENT: , lips & tongue dry.  Neck: no LAD, masses or thyromegaly Cardiovascular: IRR, no m/r/g. Bilateral LE edema.  Telemetry: SR, no arrhythmias  Respiratory: CTA bilaterally, no w/r/r. Normal respiratory effort. Abdomen: soft, ntnd Skin: no rash or induration seen on limited exam Musculoskeletal: grossly normal tone BUE/BLE Neurologic: Sedated, move all 4 extremities, eyes close.           Labs on Admission:  Basic Metabolic Panel:  Recent Labs Lab 05/03/13 0229 05/03/13 0238  NA 144 143  K 5.6* 5.3  CL 103 107  CO2 24  --   GLUCOSE 89 86  BUN 57* 53*  CREATININE 1.94* 2.00*  CALCIUM 9.3  --    Liver Function Tests:  Recent Labs Lab 05/03/13 0229  AST 84*  ALT 59*  ALKPHOS 187*  BILITOT 1.3*  PROT 7.9  ALBUMIN 3.6   No results found for this basename: LIPASE, AMYLASE,  in the last 168 hours No results found for this basename: AMMONIA,  in the last 168 hours CBC:  Recent Labs Lab 05/03/13 0229 05/03/13 0238  WBC 6.2  --   NEUTROABS 3.5  --   HGB 11.1* 12.9  HCT 33.4* 38.0  MCV 95.4  --   PLT 370  --    Cardiac Enzymes: No results found for this basename: CKTOTAL, CKMB, CKMBINDEX, TROPONINI,  in the last 168 hours  BNP (last 3 results)  Recent Labs  05/03/13 0229  PROBNP 9837.0*   CBG: No results found for this basename: GLUCAP,  in the last 168 hours  Radiological Exams on Admission: Ct Head Wo Contrast  05/03/2013   CLINICAL DATA:  Altered mental status, combative.  EXAM: CT HEAD WITHOUT CONTRAST  TECHNIQUE: Contiguous axial images were obtained from the base of the skull through the vertex without intravenous contrast.  COMPARISON:  CT of the head November 08, 2012.  FINDINGS: Moderate to severe ventriculomegaly, similar to prior examination with slight sulcal effacement of the convexities. No intraparenchymal hemorrhage, mass effect nor midline shift. Confluent  supratentorial white matter hypodensities are unchanged. No acute large vascular territory infarcts. Bilateral patchy basal ganglia and the limits hypodensities.  No abnormal extra-axial fluid collections. Basal cisterns are patent. Severe calcific atherosclerosis of the carotid siphons and to lesser extent the vertebral arteries.  No skull fracture. Visualized paranasal sinuses and mastoid aircells are well-aerated. The included ocular globes and orbital contents are non-suspicious. Status post bilateral ocular lens implants.  IMPRESSION: No acute intracranial process.  Stable appearance of the head: Moderate to severe global brain atrophy, though there is mild effacement of the sulci at the convexities condyle which can be seen with normal pressure hydrocephalus. Severe white matter changes suggest chronic small vessel  ischemic disease with patchy basal ganglia and thalamus hypodensities likely reflecting lacunar infarcts.   Electronically Signed   By: Elon Alas   On: 05/03/2013 03:01   Dg Chest Port 1 View  05/03/2013   CLINICAL DATA:  Shortness of breath, confusion.  EXAM: PORTABLE CHEST - 1 VIEW  COMPARISON:  11/08/2012  FINDINGS: Cardiac enlargement. Aortic atherosclerosis. Interstitial prominence, senescent change. No definite consolidation. Small right effusion, with blunted costophrenic angle. Limited osseous evaluation due to technique and osteopenia.  IMPRESSION: Blunted right costophrenic angle may reflect a small pleural effusion.  Prominent cardiac contour, similar to prior   Electronically Signed   By: Carlos Levering M.D.   On: 05/03/2013 03:03    EKG: A fib.   Assessment/Plan Active Problems:   Hypertension   Dementia without behavioral disturbance   Altered mental state   Acute renal failure   Hypothermia   Encephalopathy  1-Encephalopathy; Will do work up for infection. Could be related to uremia, dehydration, Will order urine culture, check blood culture, ammonia level,  TSH, B 12. . If no improvement could consider MRI. Patient already on xarelto for secondary stroke prevention.   2-Acute renal failure; could be pre-renal, secondary to decrease volume. Will hold ACE, lasix. Gentle IV fluids. Will monitor for pulmonary edema. If no improvement could consider renal US.   3-A fib, CHF; BNP elevated. Patient appears to be compensated. Will hold lasix. Her cardiology Dr Terrence Dupont will see patient in consultation. Continue with metoprolol. Pharmacy to adjust Xarelto dose.  4-Hyperkalemia: Repeat K level at 5.3.  hold ACE, potassium supplement. Repeat B-met in am.  5-Hypothermia; admit to step down. Retail banker. Work up for infection. Blood culture, Urine culture. Check TSH.   Code Status: Per prior documentation, DNR.  Family Communication: none at bedside.  Disposition Plan: expect 3 to 4 days inpatient.   Time spent: 75 minutes.   [facility] Pager [phone]

## 2013-05-03 NOTE — Progress Notes (Addendum)
ANTICOAGULATION CONSULT NOTE - Initial Consult  Pharmacy Consult for heparin Indication: atrial fibrillation  Allergies  Allergen Reactions  . Keflex [Cephalexin] Itching  . Penicillins Itching    Patient Measurements: Height: 5\' 2"  (157.5 cm) Weight: 151 lb 0.2 oz (68.5 kg) IBW/kg (Calculated) : 50.1 Heparin Dosing Weight: 64 kg  Vital Signs: Temp: 96.2 F (35.7 C) (01/26 1215) Temp src: Rectal (01/26 1215) BP: 142/86 mmHg (01/26 1215) Pulse Rate: 66 (01/26 1215)  Labs:  Recent Labs  05/03/13 0229 05/03/13 0238  HGB 11.1* 12.9  HCT 33.4* 38.0  PLT 370  --   CREATININE 1.94* 2.00*    Estimated Creatinine Clearance: 15.6 ml/min (by C-G formula based on Cr of 2).   Medical History: Past Medical History  Diagnosis Date  . Coronary artery disease   . CHF (congestive heart failure)   . Hypertension   . Anemia     acute blood loss from hip surgery 11/2012  . A-fib     Assessment: Patient is a 80 y.o F from [facility] on xarelto PTA for afib.  She [facility] AMS.  To start heparin while xarelto is on hold d/t renal insufficiency.  Nursing MAR indicated that last dose of xarelto was given on 1/25 at 8 PM.  Goal of Therapy:  Heparin level 0.3-0.7 units/ml; aPTT 66-102 Monitor platelets by anticoagulation protocol: Yes   Plan:  1) start heparin drip at 800 units/hr (no bolus) at 8 PM tonight if (aPTT < or = 102) 2) baseline heparin level and aPTT   Maeven Mcdougall P 05/03/2013,12:59 PM

## 2013-05-03 NOTE — ED Notes (Signed)
Attempted to call report.  RN Eulah Citizenauline is transporting pt to another floor.

## 2013-05-03 NOTE — ED Notes (Signed)
Pt arrived from [facility] via [facility] c/o altered mental status per staff. EMS states that pt is combative, having to tried to bite them, and to get off the gurney. PT at [facility] for rehab due to left arm fracture. Oriented to person only. Abnormal labs, Potassium 6.0 and creatinine 1.88 per [facility].

## 2013-05-03 NOTE — ED Provider Notes (Signed)
CSN: [id number]     Arrival date & time 05/03/13  0144 History   First MD Initiated Contact with Patient 05/03/13 0153     Chief Complaint  Patient presents with  . Altered Mental Status   (Consider location/radiation/quality/duration/timing/severity/associated sxs/prior Treatment) HPI Comments: 80 year old female, history of congestive heart failure, hypertension and anemia after having hip surgery in August. She presents from her nursing facility where she has been staying after she had a left forearm fracture. According to the report of the paramedics the patient had blood work drawn today and was found to have hyperkalemia with a potassium of 6.0 and acute renal failure with a creatinine of 1.88. Later this evening the patient was found to be wandering the hallways in her wheelchair with altered mental status, saying nonsensical things and appeared altered. Patient is unable to give a clear history due to altered mental status  Patient is a 80 y.o. female presenting with altered mental status. The history is provided by the patient.  Altered Mental Status   Past Medical History  Diagnosis Date  . Coronary artery disease   . CHF (congestive heart failure)   . Hypertension   . Anemia     acute blood loss from hip surgery 11/2012   Past Surgical History  Procedure Laterality Date  . Femur im nail Left 11/09/2012    Procedure: INTRAMEDULLARY (IM) NAIL FEMORAL/Left;  Surgeon: Toni ArthursJohn Hewitt, MD;  Location: [facility] OR;  Service: Orthopedics;  Laterality: Left;   No family history on file. History  Substance Use Topics  . Smoking status: Never Smoker   . Smokeless tobacco: Never Used  . Alcohol Use: No   OB History   Grav Para Term Preterm Abortions TAB SAB Ect Mult Living                 Review of Systems  Unable to perform ROS: Mental status change    Allergies  Keflex and Penicillins  Home Medications   Current Outpatient Rx  Name  Route  Sig  Dispense  Refill  . acetaminophen  (TYLENOL) 325 MG tablet   Oral   Take 650 mg by mouth every 6 (six) hours as needed for mild pain or moderate pain.         Marland Kitchen. aspirin EC 81 MG tablet   Oral   Take 81 mg by mouth daily.         . Ferrous Sulfate (IRON) 325 (65 FE) MG TABS   Oral   Take 1 tablet by mouth 2 (two) times daily.   60 each   1   . furosemide (LASIX) 20 MG tablet   Oral   Take 20 mg by mouth 2 (two) times daily.          Marland Kitchen. lisinopril (PRINIVIL,ZESTRIL) 20 MG tablet   Oral   Take 1 tablet (20 mg total) by mouth daily.   30 tablet   3   . metoprolol succinate (TOPROL-XL) 25 MG 24 hr tablet   Oral   Take 25 mg by mouth 2 (two) times daily.         . Multiple Vitamins-Minerals (MULTIVITAMIN WITH MINERALS) tablet   Oral   Take 1 tablet by mouth daily.         . nitroGLYCERIN (NITROSTAT) 0.4 MG SL tablet   Sublingual   Place 0.4 mg under the tongue every 5 (five) minutes as needed for chest pain.         . Rivaroxaban (  XARELTO) 20 MG TABS tablet   Oral   Take 1 tablet (20 mg total) by mouth daily.   30 tablet   0   . vitamin C (ASCORBIC ACID) 500 MG tablet   Oral   Take 500 mg by mouth daily.         . collagenase (SANTYL) ointment   Topical   Apply 1 application topically daily.         Marland Kitchen ketorolac (ACULAR) 0.5 % ophthalmic solution   Right Eye   Place 1 drop into the right eye 4 (four) times daily.         . potassium chloride SA (K-DUR,KLOR-CON) 20 MEQ tablet   Oral   Take 20 mEq by mouth 2 (two) times daily.          BP 151/104  Pulse 52  Temp(Src) 96.3 F (35.7 C) (Rectal)  Resp 18  SpO2 100% Physical Exam  Nursing note and vitals reviewed. Constitutional: She appears well-developed and well-nourished.  Agitated  HENT:  Head: Normocephalic and atraumatic.  Mouth/Throat: Oropharynx is clear and moist. No oropharyngeal exudate.  Eyes: Conjunctivae and EOM are normal. Pupils are equal, round, and reactive to light. Right eye exhibits no discharge. Left  eye exhibits no discharge. No scleral icterus.  Neck: Normal range of motion. Neck supple. No JVD present. No thyromegaly present.  Cardiovascular: Normal heart sounds and intact distal pulses.  Exam reveals no gallop and no friction rub.   No murmur heard. Tachycardic, irregular rhythm  Pulmonary/Chest: Effort normal and breath sounds normal. No respiratory distress. She has no wheezes. She has no rales.  Abdominal: Soft. Bowel sounds are normal. She exhibits no distension and no mass. There is no tenderness.  Musculoskeletal: Normal range of motion. She exhibits edema. She exhibits no tenderness.  2+ bilateral lower extremity pitting edema, no swelling of the left upper extremity, minimal immobilization to the left forearm with a hard splint  Lymphadenopathy:    She has no cervical adenopathy.  Neurological: She is alert.  The patient is crying out with occasional complaints of pain, she does not want to be touched, she did not want to be talked to, she is noncompliant with blood draws, she is stating "I don't feel right" but unable to give further explanation. She is moving all extremities x4 and has no apparent facial droop or slurred speech  Skin: Skin is warm and dry. No rash noted. No erythema.  Psychiatric: She has a normal mood and affect. Her behavior is normal.    ED Course  Procedures (including critical care time) Labs Review Labs Reviewed  CBC WITH DIFFERENTIAL - Abnormal; Notable for the following:    RBC 3.50 (*)    Hemoglobin 11.1 (*)    HCT 33.4 (*)    RDW 18.0 (*)    Monocytes Relative 13 (*)    All other components within normal limits  URINALYSIS, ROUTINE W REFLEX MICROSCOPIC - Abnormal; Notable for the following:    Specific Gravity, Urine >1.030 (*)    Protein, ur 30 (*)    All other components within normal limits  PRO B NATRIURETIC PEPTIDE - Abnormal; Notable for the following:    Pro B Natriuretic peptide (BNP) 9837.0 (*)    All other components within  normal limits  COMPREHENSIVE METABOLIC PANEL - Abnormal; Notable for the following:    Potassium 5.6 (*)    BUN 57 (*)    Creatinine, Ser 1.94 (*)    AST 84 (*)  ALT 59 (*)    Alkaline Phosphatase 187 (*)    Total Bilirubin 1.3 (*)    GFR calc non Af Amer 21 (*)    GFR calc Af Amer 24 (*)    All other components within normal limits  URINE MICROSCOPIC-ADD ON - Abnormal; Notable for the following:    Casts HYALINE CASTS (*)    All other components within normal limits  POCT I-STAT, CHEM 8 - Abnormal; Notable for the following:    BUN 53 (*)    Creatinine, Ser 2.00 (*)    Calcium, Ion 1.07 (*)    All other components within normal limits   Imaging Review Ct Head Wo Contrast  05/03/2013   CLINICAL DATA:  Altered mental status, combative.  EXAM: CT HEAD WITHOUT CONTRAST  TECHNIQUE: Contiguous axial images were obtained from the base of the skull through the vertex without intravenous contrast.  COMPARISON:  CT of the head November 08, 2012.  FINDINGS: Moderate to severe ventriculomegaly, similar to prior examination with slight sulcal effacement of the convexities. No intraparenchymal hemorrhage, mass effect nor midline shift. Confluent supratentorial white matter hypodensities are unchanged. No acute large vascular territory infarcts. Bilateral patchy basal ganglia and the limits hypodensities.  No abnormal extra-axial fluid collections. Basal cisterns are patent. Severe calcific atherosclerosis of the carotid siphons and to lesser extent the vertebral arteries.  No skull fracture. Visualized paranasal sinuses and mastoid aircells are well-aerated. The included ocular globes and orbital contents are non-suspicious. Status post bilateral ocular lens implants.  IMPRESSION: No acute intracranial process.  Stable appearance of the head: Moderate to severe global brain atrophy, though there is mild effacement of the sulci at the convexities condyle which can be seen with normal pressure hydrocephalus.  Severe white matter changes suggest chronic small vessel ischemic disease with patchy basal ganglia and thalamus hypodensities likely reflecting lacunar infarcts.   Electronically Signed   By: Awilda Metro   On: 05/03/2013 03:01   Dg Chest Port 1 View  05/03/2013   CLINICAL DATA:  Shortness of breath, confusion.  EXAM: PORTABLE CHEST - 1 VIEW  COMPARISON:  11/08/2012  FINDINGS: Cardiac enlargement. Aortic atherosclerosis. Interstitial prominence, senescent change. No definite consolidation. Small right effusion, with blunted costophrenic angle. Limited osseous evaluation due to technique and osteopenia.  IMPRESSION: Blunted right costophrenic angle may reflect a small pleural effusion.  Prominent cardiac contour, similar to prior   Electronically Signed   By: Jearld Lesch M.D.   On: 05/03/2013 03:03    EKG Interpretation   None       MDM   1. Acute renal failure   2. Altered mental status   3. Dehydration    Altered mental status of unknown etiology, the patient apparently has acute renal failure as well as hyperkalemia, we'll draw labs and EKG to confirm prior workup as well as further testing for sources of altered mental status include infectious with chest x-ray and urinalysis as well as a CT scan of the head. The patient is on anticoagulants - xarelto  CT scan of the head shows no acute findings, lab work shows renal failure, uremia likely secondary to dehydration, mild hypothermia, has been warmed, IVF started, no findings on UA. Labs reviewed - no other acute findings -   D/w Dr. Julian Reil who will admit.  Vida Roller, MD 05/03/13 [phone]

## 2013-05-03 NOTE — Progress Notes (Signed)
Patient received to room 3south bed 7 from ED. Patient is oriented to person,place only. Vitals obtained, settled into the bed. Bed alarm is on.No family members present at bedside.

## 2013-05-04 ENCOUNTER — Encounter (HOSPITAL_COMMUNITY): Payer: Self-pay | Admitting: *Deleted

## 2013-05-04 DIAGNOSIS — E860 Dehydration: Secondary | ICD-10-CM

## 2013-05-04 DIAGNOSIS — F0390 Unspecified dementia without behavioral disturbance: Secondary | ICD-10-CM

## 2013-05-04 DIAGNOSIS — I4891 Unspecified atrial fibrillation: Secondary | ICD-10-CM

## 2013-05-04 DIAGNOSIS — Z515 Encounter for palliative care: Secondary | ICD-10-CM

## 2013-05-04 LAB — URINE CULTURE: Colony Count: 75000

## 2013-05-04 LAB — BASIC METABOLIC PANEL
BUN: 56 mg/dL — ABNORMAL HIGH (ref 6–23)
CHLORIDE: 110 meq/L (ref 96–112)
CO2: 24 mEq/L (ref 19–32)
CREATININE: 1.72 mg/dL — AB (ref 0.50–1.10)
Calcium: 8.4 mg/dL (ref 8.4–10.5)
GFR calc Af Amer: 28 mL/min — ABNORMAL LOW (ref >90)
GFR calc non Af Amer: 24 mL/min — ABNORMAL LOW (ref >90)
GLUCOSE: 67 mg/dL — AB (ref 70–99)
POTASSIUM: 4.6 meq/L (ref 3.7–5.3)
Sodium: 148 mEq/L — ABNORMAL HIGH (ref 137–147)

## 2013-05-04 LAB — APTT
APTT: 165 s — AB (ref 24–37)
aPTT: 136 seconds — ABNORMAL HIGH (ref 24–37)
aPTT: >200 seconds (ref 24–37)
aPTT: >200 seconds (ref 24–37)

## 2013-05-04 LAB — CBC
HEMATOCRIT: 28.7 % — AB (ref 36.0–46.0)
HEMOGLOBIN: 9.4 g/dL — AB (ref 12.0–15.0)
MCH: 31.4 pg (ref 26.0–34.0)
MCHC: 32.8 g/dL (ref 30.0–36.0)
MCV: 96 fL (ref 78.0–100.0)
Platelets: 292 10*3/uL (ref 150–400)
RBC: 2.99 MIL/uL — ABNORMAL LOW (ref 3.87–5.11)
RDW: 18 % — AB (ref 11.5–15.5)
WBC: 4.7 10*3/uL (ref 4.0–10.5)

## 2013-05-04 LAB — HEPARIN LEVEL (UNFRACTIONATED)
Heparin Unfractionated: >2.2 IU/mL — ABNORMAL HIGH (ref 0.30–0.70)
Heparin Unfractionated: >2.2 IU/mL — ABNORMAL HIGH (ref 0.30–0.70)

## 2013-05-04 LAB — TROPONIN I: Troponin I: <0.3 ng/mL (ref <0.30)

## 2013-05-04 LAB — PROTIME-INR
INR: 2.8 — ABNORMAL HIGH (ref 0.00–1.49)
Prothrombin Time: 28.5 seconds — ABNORMAL HIGH (ref 11.6–15.2)

## 2013-05-04 MED ORDER — BISACODYL 10 MG RE SUPP: 10.0000 mg | Freq: Every day | RECTAL | Status: DC | PRN

## 2013-05-04 MED ORDER — HEPARIN (PORCINE) IN NACL 100-0.45 UNIT/ML-% IJ SOLN
650.0000 [IU]/h | INTRAMUSCULAR | Status: DC
Start: 2013-05-04 — End: 2013-05-04
  Filled 2013-05-04: qty 250

## 2013-05-04 MED ORDER — HEPARIN (PORCINE) IN NACL 100-0.45 UNIT/ML-% IJ SOLN
450.0000 [IU]/h | INTRAMUSCULAR | Status: DC
Start: 2013-05-04 — End: 2013-05-05
  Administered 2013-05-04: 550 [IU]/h via INTRAVENOUS
  Administered 2013-05-05: 450 [IU]/h via INTRAVENOUS
  Filled 2013-05-04: qty 250

## 2013-05-04 MED ORDER — AMIODARONE HCL 200 MG PO TABS
200.0000 mg | ORAL_TABLET | Freq: Every day | ORAL | Status: DC
  Administered 2013-05-04 – 2013-05-06 (×3): 200 mg via ORAL
  Filled 2013-05-04 (×3): qty 1

## 2013-05-04 MED ORDER — WARFARIN - PHARMACIST DOSING INPATIENT: Freq: Every day | Status: DC

## 2013-05-04 MED ORDER — ACETAMINOPHEN 650 MG RE SUPP
650.0000 mg | Freq: Four times a day (QID) | RECTAL | Status: DC | PRN
  Administered 2013-05-06: 650 mg via RECTAL
  Filled 2013-05-04: qty 1

## 2013-05-04 MED ORDER — WARFARIN SODIUM 2 MG PO TABS
2.0000 mg | ORAL_TABLET | Freq: Once | ORAL | Status: AC
  Administered 2013-05-04: 2 mg via ORAL
  Filled 2013-05-04: qty 1

## 2013-05-04 MED ORDER — DEXTROSE 5 % IV SOLN
INTRAVENOUS | Status: DC
  Administered 2013-05-04: 18:00:00 via INTRAVENOUS
  Administered 2013-05-05 (×2): 1000 mL via INTRAVENOUS

## 2013-05-04 MED ORDER — METOPROLOL TARTRATE 12.5 MG HALF TABLET
12.5000 mg | ORAL_TABLET | Freq: Two times a day (BID) | ORAL | Status: DC
  Administered 2013-05-04 – 2013-05-06 (×5): 12.5 mg via ORAL
  Filled 2013-05-04 (×6): qty 1

## 2013-05-04 NOTE — Progress Notes (Signed)
Utilization review completed.  

## 2013-05-04 NOTE — Progress Notes (Addendum)
ANTICOAGULATION CONSULT NOTE - Follow Up Consult  Pharmacy Consult for heparin Indication: atrial fibrillation  Allergies  Allergen Reactions  . Keflex [Cephalexin] Itching  . Penicillins Itching    Patient Measurements: Height: 5\' 2"  (157.5 cm) Weight: 157 lb 3 oz (71.3 kg) IBW/kg (Calculated) : 50.1 Heparin Dosing Weight: 64 kg  Vital Signs: Temp: 96.9 F (36.1 C) (01/27 0744) Temp src: Axillary (01/27 0744) BP: 149/94 mmHg (01/27 0723) Pulse Rate: 62 (01/27 0723)  Labs:  Recent Labs  05/03/13 0229 05/03/13 0238 05/03/13 1228  05/03/13 1855 05/03/13 2316 05/04/13 0315 05/04/13 0510 05/04/13 0815  HGB 11.1* 12.9  --   --   --   --  9.4*  --   --   HCT 33.4* 38.0  --   --   --   --  28.7*  --   --   PLT 370  --   --   --   --   --  292  --   --   APTT  --   --   --   < > 43*  --  136* >200* >200*  HEPARINUNFRC  --   --   --   --  >2.20*  --  >2.20*  --   --   CREATININE 1.94* 2.00*  --   --   --   --  1.72*  --   --   TROPONINI  --   --  <0.30  --   --  <0.30 <0.30  --   --   < > = values in this interval not displayed.  Estimated Creatinine Clearance: 18.5 ml/min (by C-G formula based on Cr of 1.72).  Assessment: Patient is a 80 y.o F currently on heparin while xarelto is on hold.  APTT dawn at 0800 this morning now back >200.  RN stated that level was drawn on correct arm and no bleeding noted.   Since patient was on xarelto, heparin level will be affected so will rely on aPTT to monitor heparin therapy for now until xarelto is cleared from her system.   Goal of Therapy:  aPTT goal 66-102; heparin level 0.3-0.7 Monitor platelets by anticoagulation protocol: Yes   Plan:  1) hold heparin drip for 1 hour, then resume at 650 units/hr 2) check 8 hr heparin level and aPTT   Davidmichael Zarazua P 05/04/2013,9:46 AM  Adden (10:33 AM): To start coumadin today per Dr. Sharyn LullHarwani.  Will give coumadin 2mg  PO x1 today

## 2013-05-04 NOTE — Progress Notes (Addendum)
ANTICOAGULATION CONSULT NOTE - Follow Up Consult  Pharmacy Consult for heparin Indication: atrial fibrillation  Allergies  Allergen Reactions  . Keflex [Cephalexin] Itching  . Penicillins Itching    Patient Measurements: Height: 5\' 2"  (157.5 cm) Weight: 157 lb 3 oz (71.3 kg) IBW/kg (Calculated) : 50.1 Heparin Dosing Weight: 64 kg  Vital Signs: Temp: 97.8 F (36.6 C) (01/27 1515) Temp src: Oral (01/27 1515) BP: 95/67 mmHg (01/27 1515) Pulse Rate: 58 (01/27 1515)  Labs:  Recent Labs  05/03/13 0229 05/03/13 0238  05/03/13 1228  05/03/13 1855 05/03/13 2316 05/04/13 0315 05/04/13 0510 05/04/13 0815 05/04/13 1100 05/04/13 1130 05/04/13 1849  HGB 11.1* 12.9  --   --   --   --   --  9.4*  --   --   --   --   --   HCT 33.4* 38.0  --   --   --   --   --  28.7*  --   --   --   --   --   PLT 370  --   --   --   --   --   --  292  --   --   --   --   --   APTT  --   --   --   --   < > 43*  --  136* >200* >200*  --   --  165*  LABPROT  --   --   --   --   --   --   --   --   --   --  28.5*  --   --   INR  --   --   --   --   --   --   --   --   --   --  2.80*  --   --   HEPARINUNFRC  --   --   --   --   --  >2.20*  --  >2.20*  --   --   --   --   --   CREATININE 1.94* 2.00*  --   --   --   --   --  1.72*  --   --   --   --   --   TROPONINI  --   --   < > <0.30  --   --  <0.30 <0.30  --   --   --  <0.30  --   < > = values in this interval not displayed.  Estimated Creatinine Clearance: 18.5 ml/min (by C-G formula based on Cr of 1.72).   Medications:  Scheduled:  . amiodarone  200 mg Oral Daily  . aspirin EC  81 mg Oral Daily  . Chlorhexidine Gluconate Cloth  6 each Topical Q0600  . collagenase  1 application Topical Daily  . docusate sodium  100 mg Oral BID  . ferrous sulfate  325 mg Oral BID  . ketorolac  1 drop Right Eye QID  . metoprolol tartrate  12.5 mg Oral BID  . mupirocin ointment   Nasal BID  . Warfarin - Pharmacist Dosing Inpatient   Does not apply q1800    Infusions:  . dextrose 75 mL/hr at 05/04/13 1900  . heparin 650 Units/hr (05/04/13 1900)    Assessment: 80 yo female is currently on supratherapeutic heparin for hx of afib.  Patient was on xarelto prior and per nursing home record, last dose  was 05/02/13.  Her aPTT level is 165 and heparin level is >2.2.  Goal of Therapy:  aPTT 66-102 seconds; heparin level 0.3-0.7 Monitor platelets by anticoagulation protocol: Yes   Plan:  1) Hold heparin drip for 1 hour, then reduce drip to 550 units/hr 2) 8hr aPTT to confirm  Ludie Pavlik, Tsz-Yin 05/04/2013,7:48 PM

## 2013-05-04 NOTE — Consult Note (Signed)
Patient Danielle [name]      DOB: July 07, 1916      VHQ:[id number]     Consult Note from the Palliative Medicine Team at [facility] Requested by: Dr. Terrence Dupont     PCP: Patricia Nettle, MD Reason for Consultation: Clarification GOC and options.  Phone Number:[phone]  Assessment of patients Current state: 80 yo female with failure to thrive CHF (EF 40-45%), CAD, dementia, dysphagia, new onset atrial fibrillation. I met today with Danielle [name] only daughter, Marguarite Arbour [phone]) about goals of care. We had a long discussion about what quality of life is for Danielle [name]. Holley Raring says that her mother has very little enjoyment in her life and often talks about "old times" and about her own parents and siblings that are all deceased. Danielle [name] talks about how she wants her grandchildren and great grandchildren to visit more. Holley Raring says that her mother is often sad about her life now and has had difficulty losing her independence. Holley Raring says "this is no way to live." Danielle [name] has had many falls, decreased appetite, and trouble swallowing that has all worsened over the past month. Holley Raring says that her mother is often drowsy and "sleeps all the time."   We discussed all these symptoms as part of her dementia and failure to thrive. We discussed the natural trajectory of her illnesses. I presented [name] with an option of full comfort care and she was receptive but unable to commit to decisions right now. We also discussed artifical feeding/feeding tubes. I gave her a Hard Choices book and asked if I could speak with her tomorrow about making decisions and making a good plan for Danielle [name] - and she was receptive. Holley Raring says she is pleased with our conversation and feels good about what we discussed. DNR status was confirmed. Holley Raring says that she is HCPOA and that Danielle [name] has already made arrangements for her funeral.   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 Danielle [name] was the "baby girl" out of 3 siblings and has struggled with the passing of her parents and siblings. Holley Raring said that since her mother she was the "baby girl" she was very stubborn and strong willed. Very pleasant family. I plan to meet with Holley Raring again tomorrow to further elicit goals.    Goals of Care: 1.  Code Status: DNR   2. Scope of Treatment: To be further elicited and determined on outcomes.    4. Disposition: To be determined on outcomes.    3. Symptom Management:   1. Pain: Acetaminophen supp prn.  2. Bowel Regimen: Dulcolax supp prn. 3. Nausea/Vomiting: Ondansetron prn.   4. Psychosocial: Emotional support provided to Danielle [name] and her daughter Holley Raring.   5. Spiritual: Spiritual care consult.    Patient Documents Completed or Given: Document Given Completed  Advanced Directives Pkt    MOST    DNR    Gone from My Sight    Hard Choices yes     Brief HPI: 80 yo female failure to thrive.    ROS: Unable to assess - confused and unable to answer.    PMH:  Past Medical History  Diagnosis Date  . Coronary artery disease   . CHF (congestive heart failure)   . Hypertension   . Anemia     acute blood loss from hip surgery 11/2012  . A-fib  PSH: Past Surgical History  Procedure Laterality Date  . Femur im nail Left 11/09/2012    Procedure: INTRAMEDULLARY (IM) NAIL FEMORAL/Left;  Surgeon: Wylene Simmer, MD;  Location: [facility];  Service: Orthopedics;  Laterality: Left;   I have reviewed the [place] and [facility] and  If appropriate update it with new information. Allergies  Allergen Reactions  . Keflex [Cephalexin] Itching  . Penicillins Itching   Scheduled Meds: . amiodarone  200 mg Oral Daily  . aspirin EC  81 mg Oral Daily  . Chlorhexidine Gluconate Cloth  6 each Topical Q0600  . collagenase  1 application Topical Daily  . docusate sodium  100 mg Oral BID  . ferrous sulfate  325 mg  Oral BID  . furosemide  20 mg Intravenous Daily  . ketorolac  1 drop Right Eye QID  . metoprolol tartrate  12.5 mg Oral BID  . mupirocin ointment   Nasal BID  . warfarin  2 mg Oral ONCE-1800  . Warfarin - Pharmacist Dosing Inpatient   Does not apply q1800   Continuous Infusions: . sodium chloride 50 mL/hr at 05/03/13 2300  . heparin 650 Units/hr (05/04/13 1045)   PRN Meds:.acetaminophen, nitroGLYCERIN, ondansetron (ZOFRAN) IV, ondansetron    BP 149/94  Pulse 62  Temp(Src) 97.9 F (36.6 C) (Oral)  Resp 16  Ht $R'5\' 2"'ik$  (1.575 m)  Wt 71.3 kg (157 lb 3 oz)  BMI 28.74 kg/m2  SpO2 97%   PPS: 20%   Intake/Output Summary (Last 24 hours) at 05/04/13 1219 Last data filed at 05/04/13 1048  Gross per 24 hour  Intake 1262.03 ml  Output   1100 ml  Net 162.03 ml    Physical Exam:  General: NAD, agitated HEENT:  [place]/AT, moist mucous membranes Chest: CTA throughout, decreased bases, shallow breathes CVS: Irreg rate/rhythm - A fib, S1 S2 Abdomen: Soft, NT, ND, +BS Ext: BLE 2+ edema Neuro: Alert, agitated/confused  Labs: CBC    Component Value Date/Time   WBC 4.7 05/04/2013 0315   RBC 2.99* 05/04/2013 0315   HGB 9.4* 05/04/2013 0315   HCT 28.7* 05/04/2013 0315   PLT 292 05/04/2013 0315   MCV 96.0 05/04/2013 0315   MCH 31.4 05/04/2013 0315   MCHC 32.8 05/04/2013 0315   RDW 18.0* 05/04/2013 0315   LYMPHSABS 1.9 05/03/2013 0229   MONOABS 0.8 05/03/2013 0229   EOSABS 0.0 05/03/2013 0229   BASOSABS 0.0 05/03/2013 0229    BMET    Component Value Date/Time   NA 148* 05/04/2013 0315   K 4.6 05/04/2013 0315   CL 110 05/04/2013 0315   CO2 24 05/04/2013 0315   GLUCOSE 67* 05/04/2013 0315   BUN 56* 05/04/2013 0315   CREATININE 1.72* 05/04/2013 0315   CALCIUM 8.4 05/04/2013 0315   GFRNONAA 24* 05/04/2013 0315   GFRAA 28* 05/04/2013 0315    CMP     Component Value Date/Time   NA 148* 05/04/2013 0315   K 4.6 05/04/2013 0315   CL 110 05/04/2013 0315   CO2 24 05/04/2013 0315   GLUCOSE 67*  05/04/2013 0315   BUN 56* 05/04/2013 0315   CREATININE 1.72* 05/04/2013 0315   CALCIUM 8.4 05/04/2013 0315   PROT 7.9 05/03/2013 0229   ALBUMIN 3.6 05/03/2013 0229   AST 84* 05/03/2013 0229   ALT 59* 05/03/2013 0229   ALKPHOS 187* 05/03/2013 0229   BILITOT 1.3* 05/03/2013 0229   GFRNONAA 24* 05/04/2013 0315   GFRAA 28* 05/04/2013 0315     Time In  Time Out Total Time Spent with Patient Total Overall Time  1100 1230 59min 25min    Greater than 50%  of this time was spent counseling and coordinating care related to the above assessment and plan.  Vinie Sill, NP Palliative Medicine Team Pager # [phone] Team Phone # [phone]   Discussed with Dr. Wynelle Cleveland.

## 2013-05-04 NOTE — Progress Notes (Signed)
Chaplain responded to spiritual consult. Pt stated that she wanted to get out of bed and that she couldn't stand not being able to go to the bathroom but doing it in her bed. She stated that she had "integrity," and had been trained not to do this for her whole life. Pt seems to be struggling with loss of mobility and independence. Chaplain practiced empathy and active listening as she voiced these frustrations. Chaplain offered a prayer for pt's comfort and healing. Pt seemed calmer and then dozed off. Chaplain will follow up as necessary.

## 2013-05-04 NOTE — Progress Notes (Signed)
ANTICOAGULATION CONSULT NOTE - Follow Up Consult  Pharmacy Consult for Heparin  Indication: atrial fibrillation  Allergies  Allergen Reactions  . Keflex [Cephalexin] Itching  . Penicillins Itching   Patient Measurements: Height: 5\' 2"  (157.5 cm) Weight: 157 lb 3 oz (71.3 kg) IBW/kg (Calculated) : 50.1 Heparin Dosing Weight: 64kg  Labs:  Recent Labs  05/03/13 0229 05/03/13 0238 05/03/13 1228 05/03/13 1855 05/03/13 2316 05/04/13 0315  HGB 11.1* 12.9  --   --   --  9.4*  HCT 33.4* 38.0  --   --   --  28.7*  PLT 370  --   --   --   --  292  APTT  --   --   --  43*  --  136*  HEPARINUNFRC  --   --   --  >2.20*  --   --   CREATININE 1.94* 2.00*  --   --   --  1.72*  TROPONINI  --   --  <0.30  --  <0.30 <0.30   Assessment: APTT 136 sec, inaccurate level as lab was drawn from the same site as heparin infusion, stat re-draw aPTT, phlebotomy called with instructions to drawn from opposite arm  Goal of Therapy:  Heparin level 0.3-0.7 units/ml aPTT 66-102 seconds Monitor platelets by anticoagulation protocol: Yes   Plan:  Stat re-draw aPTT  Danielle [name], Danielle [name] 05/04/2013,4:34 AM

## 2013-05-04 NOTE — Progress Notes (Signed)
[facility] Progress Note [place] TEAM 1 - Stepdown/ICU TEAM   Danielle [name] ZOX:[id number] DOB: 08-06-1916 DOA: 05/03/2013 PCP: Pola Corn, MD  Brief narrative: Danielle [name] is a 80 y.o. female with PMH history of congestive heart failure, hypertension and anemia after having hip surgery in August. She also had Left forearm fracture. According to the report of the paramedics the patient had blood work drawn. She was found to have hyperkalemia with a potassium of 6.0 and acute renal failure with a creatinine of 1.88. Later during the evening the patient was found to be wandering the hallways in her wheelchair saying nonsensical things.   Subjective: Pt has no complaints.   Assessment/Plan: Principal Problem:   Acute Encephalopathy with a h/o Dementia without behavioral disturbance - CT head negative - UTI may be the cause- although only 75, 000 colonies or multiple morphotypes in culture, will treat with Levaquin - ? Related to dehydration and uremia from ARF - d/c Lasix and continue to hydrate  Active Problems:   Hypertension - controlled    Acute renal failure - d/c Lasix and hydrate slowly (also NPO) -  no signs of fluid overload clinically (despite high BNP)  A-fib - management per cardiology- oral Amio and Coumadin started today    Hyperkalemia - resolved  Hypernatremia - switch fluids to D5 W    Hypothermia - related to sepsis from UTI? - blood cultures negative - TSH normal  Nutrition -obtain swallow eval due to coughing with swallowing - NPO for now    Code Status: DNr Family Communication: none Disposition Plan: transfer to telemetry and back to SNF- Palliative care also following  Consultants: Cardiology   Procedures: none  Antibiotics: levaquin  DVT prophylaxis: Heparin   Objective: Blood pressure 161/96, pulse 78, temperature 97.2 F (36.2 C), temperature source Axillary, resp. rate 16, height 5\' 2"  (1.575 m), weight 71.3  kg (157 lb 3 oz), SpO2 100.00%.  Intake/Output Summary (Last 24 hours) at 05/04/13 1406 Last data filed at 05/04/13 1300  Gross per 24 hour  Intake 1482.66 ml  Output   1100 ml  Net 382.66 ml     Exam: General: oriented to person only, No acute respiratory distress Lungs: Clear to auscultation bilaterally without wheezes or crackles Cardiovascular: Irregular rate and rhythm without murmur gallop or rub normal S1 and S2 Abdomen: Nontender, nondistended, soft, bowel sounds positive, no rebound, no ascites, no appreciable mass Extremities: No significant cyanosis, clubbing, or edema bilateral lower extremities  Data Reviewed: Basic Metabolic Panel:  Recent Labs Lab 05/03/13 0229 05/03/13 0238 05/04/13 0315  NA 144 143 148*  K 5.6* 5.3 4.6  CL 103 107 110  CO2 24  --  24  GLUCOSE 89 86 67*  BUN 57* 53* 56*  CREATININE 1.94* 2.00* 1.72*  CALCIUM 9.3  --  8.4   Liver Function Tests:  Recent Labs Lab 05/03/13 0229  AST 84*  ALT 59*  ALKPHOS 187*  BILITOT 1.3*  PROT 7.9  ALBUMIN 3.6   No results found for this basename: LIPASE, AMYLASE,  in the last 168 hours  Recent Labs Lab 05/03/13 1228  AMMONIA 49   CBC:  Recent Labs Lab 05/03/13 0229 05/03/13 0238 05/04/13 0315  WBC 6.2  --  4.7  NEUTROABS 3.5  --   --   HGB 11.1* 12.9 9.4*  HCT 33.4* 38.0 28.7*  MCV 95.4  --  96.0  PLT 370  --  292   Cardiac Enzymes:  Recent Labs Lab 05/03/13 1228 05/03/13 2316 05/04/13 0315 05/04/13 1130  TROPONINI <0.30 <0.30 <0.30 <0.30   BNP (last 3 results)  Recent Labs  05/03/13 0229  PROBNP 9837.0*   CBG: No results found for this basename: GLUCAP,  in the last 168 hours  Recent Results (from the past 240 hour(s))  URINE CULTURE     Status: None   Collection Time    05/03/13  4:06 AM      Result Value Range Status   Specimen Description URINE, CATHETERIZED   Final   Special Requests ADDED [phone]   Final   Culture  Setup Time     Final   Value:  05/03/2013 13:54     Performed at [facility]   Colony Count     Final   Value: 75,000 COLONIES/ML     Performed at [facility]   Culture     Final   Value: Multiple bacterial morphotypes present, none predominant. Suggest appropriate recollection if clinically indicated.     Performed at [facility]   Report Status 05/04/2013 FINAL   Final  MRSA PCR SCREENING     Status: Abnormal   Collection Time    05/03/13  9:12 AM      Result Value Range Status   MRSA by PCR POSITIVE (*) NEGATIVE Final   Comment:            The GeneXpert MRSA Assay (FDA     approved for NASAL specimens     only), is one component of a     comprehensive MRSA colonization     surveillance program. It is not     intended to diagnose MRSA     infection nor to guide or     monitor treatment for     MRSA infections.     RESULT CALLED TO, READ BACK BY AND VERIFIED WITH:     Mellody MemosP. COLQUHOUN RN 11:00 05/03/13 ([email or web address])  CULTURE, BLOOD (ROUTINE X 2)     Status: None   Collection Time    05/03/13 12:15 PM      Result Value Range Status   Specimen Description BLOOD RIGHT HAND   Final   Special Requests BOTTLES DRAWN AEROBIC ONLY 5CCS   Final   Culture  Setup Time     Final   Value: 05/03/2013 20:36     Performed at [facility]   Culture     Final   Value:        BLOOD CULTURE RECEIVED NO GROWTH TO DATE CULTURE WILL BE HELD FOR 5 DAYS BEFORE ISSUING A FINAL NEGATIVE REPORT     Performed at [facility]   Report Status PENDING   Incomplete  CULTURE, BLOOD (ROUTINE X 2)     Status: None   Collection Time    05/03/13 12:28 PM      Result Value Range Status   Specimen Description BLOOD RIGHT HAND   Final   Special Requests BOTTLES DRAWN AEROBIC ONLY 5CC   Final   Culture  Setup Time     Final   Value: 05/03/2013 20:38     Performed at [facility]   Culture     Final   Value:        BLOOD CULTURE RECEIVED NO GROWTH TO DATE CULTURE WILL BE HELD FOR 5 DAYS BEFORE  ISSUING A FINAL NEGATIVE REPORT     Performed at [facility]  Report Status PENDING   Incomplete     Studies:  Recent x-ray studies have been reviewed in detail by the Attending Physician  Scheduled Meds:  Scheduled Meds: . amiodarone  200 mg Oral Daily  . aspirin EC  81 mg Oral Daily  . Chlorhexidine Gluconate Cloth  6 each Topical Q0600  . collagenase  1 application Topical Daily  . docusate sodium  100 mg Oral BID  . ferrous sulfate  325 mg Oral BID  . furosemide  20 mg Intravenous Daily  . ketorolac  1 drop Right Eye QID  . metoprolol tartrate  12.5 mg Oral BID  . mupirocin ointment   Nasal BID  . warfarin  2 mg Oral ONCE-1800  . Warfarin - Pharmacist Dosing Inpatient   Does not apply q1800   Continuous Infusions: . sodium chloride 50 mL/hr at 05/03/13 2300  . heparin 650 Units/hr (05/04/13 1045)    Time spent on care of this patient: >35   Calvert Cantor, MD  [facility] Office  [phone] Pager - Text Page per Amion as per below:  On-Call/Text Page:      [email or web address]      password [email or web address]  If 7PM-7AM, please contact night-coverage [email or web address] Password [email or web address] 05/04/2013, 2:06 PM   LOS: 1 day

## 2013-05-04 NOTE — Clinical Social Work Psychosocial (Signed)
Clinical Social Work Department BRIEF PSYCHOSOCIAL ASSESSMENT 05/04/2013  Patient:  Danielle [name],Danielle [name]     Account Number:  [id number]     Admit date:  05/03/2013  Clinical Social Worker:  Lavell LusterAMPBELL,Marthe Dant BRYANT, LCSWA  Date/Time:  05/04/2013 12:47 PM  Referred by:  Physician  Date Referred:  05/04/2013 Referred for  SNF Placement   Other Referral:   Interview type:  Family Other interview type:   Patient not oriented at time of assessment. Daughter interviewed by phone.    PSYCHOSOCIAL DATA Living Status:  ALONE [facility] Level of care:  Skilled Nursing Facility Primary support name:  Danielle [name] Primary support relationship to patient:  CHILD, ADULT Degree of support available:   Support is adequate. However, patient lives alone with daughter next door.    CURRENT CONCERNS Current Concerns  Post-Acute Placement   Other Concerns:    SOCIAL WORK ASSESSMENT / PLAN CSW spoke with daughter over phone to confirm that patient is from [facility]. Daughter states that patient is from [facility] and she plans for patient to return upon DC from hospital. Daughter states that patient lives next door to her and she plans for the patient to return home after rehab. [facility] "cou[facility] of [facility]". CSW will assist with DC.   Assessment/plan status:  Psychosocial Support/Ongoing Assessment of Needs Other assessment/ plan:   Complete FL2, Fax   Information/referral to community resources:   CSW contact information given to daughter.    PATIENT'S/FAMILY'S RESPONSE TO PLAN OF CARE: Daughter is planning for patient to return to [facility] upon DC. Daughter was pleasant, appropriate, and appreciative of CSW contact. CSW will follow.       Roddie McBryant Zared Knoth, MaconLCSWA, GrafLCASA, [phone]

## 2013-05-04 NOTE — Evaluation (Signed)
Clinical/Bedside Swallow Evaluation Patient Details  Name: Danielle [name] MRN: [id number] Date of Birth: 1916-04-10  Today's Date: 05/04/2013 Time: 5176-1607 SLP Time Calculation (min): 15 min  Past Medical History:  Past Medical History  Diagnosis Date  . Coronary artery disease   . CHF (congestive heart failure)   . Hypertension   . Anemia     acute blood loss from hip surgery 11/2012  . A-fib    Past Surgical History:  Past Surgical History  Procedure Laterality Date  . Femur im nail Left 11/09/2012    Procedure: INTRAMEDULLARY (IM) NAIL FEMORAL/Left;  Surgeon: Wylene Simmer, MD;  Location: [facility];  Service: Orthopedics;  Laterality: Left;   HPI:  80 yo female with failure to thrive CHF (EF 40-45%), CAD, dementia, dysphagia, new onset atrial fibrillation.  Pt and dtr met with Palliative Medicine today.  Pt recently seen by SLP services during Dec 2014 admission, at which time swallow eval revealed a likely primary esophageal dysphagia.  Pt has been eating a pureed/chopped diet for years.     Assessment / Plan / Recommendation Clinical Impression  Pt's swallow function is consistent with 04/02/13 swallowing evaluation.  Pt demonstrates symptoms consistent with an esophageal dysphagia, with frequent eructation and intermittent c/o globus, but no s/s of oropharyngeal dysfunction.  Pt states she is "very hungry;" consumed container of Ensure shake, 4 oz applesauce, and water, with no s/s of aspiration.  Recommend resuming prior Dysphagia 2 diet, thin liquids. Crush meds.  Pt will need assist with feeding given visual deficits.  No further SLP f/u warranted.      Aspiration Risk  Mild    Diet Recommendation Dysphagia 2 (Fine chop);Thin liquid   Liquid Administration via: Straw;Cup Medication Administration: Crushed with puree Supervision: Staff to assist with self feeding Postural Changes and/or Swallow Maneuvers: Out of bed for meals;Seated upright 90 degrees    Other   Recommendations Oral Care Recommendations: Oral care BID   Follow Up Recommendations  None       Pertinent Vitals/Pain No c/o pain    SLP Swallow Goals     Swallow Study Prior Functional Status       General  Type of Study: Bedside swallow evaluation Previous Swallow Assessment: 12/26 eval: "Pt demonstrates evidence of a primary esophageal or cervical esophageal dysphagia, consistent with pts dtr description of pts baseline. Pt has been unable to swallow anything but very soft/puree foods for many years and requires her pills to be crushed. With SLP, pt is anxious and grimacing when swallowing puree. There is no evidence of aspiration but pt does have a slightly audible swallow. No intervention is necessary other than basic esophageal precautions as pt unlikely to be a candidate for any intervention based on age. Will start a Dys 2 (fine chop) diet with thin liquids, meds crushed. " Diet Prior to this Study: NPO Temperature Spikes Noted: No Respiratory Status: Room air Behavior/Cognition: Alert;Cooperative;Pleasant mood Oral Cavity - Dentition: Dentures, top;Dentures, bottom Self-Feeding Abilities: Able to feed self;Needs assist Patient Positioning: Upright in chair Baseline Vocal Quality: Clear Volitional Cough: Strong Volitional Swallow: Able to elicit    Oral/Motor/Sensory Function Overall Oral Motor/Sensory Function: Appears within functional limits for tasks assessed   Ice Chips Ice chips: Within functional limits Presentation: Spoon   Thin Liquid Thin Liquid: Within functional limits Presentation: Straw    Nectar Thick Nectar Thick Liquid: Not tested   Honey Thick Honey Thick Liquid: Not tested   Puree Puree: Within functional limits Presentation: Spoon  Solid      Orrie Lascano L. [employer], [place] CCC/SLP Pager [phone]  Solid: Not tested       Juan Quam Laurice 05/04/2013,3:48 PM

## 2013-05-04 NOTE — Progress Notes (Signed)
Subjective:  More alert today. Denies any chest pain or shortness of breath. Remains in A. fib Objective:  Vital Signs in the last 24 hours: Temp:  [96.2 F (35.7 C)-97.6 F (36.4 C)] 96.9 F (36.1 C) (01/27 0744) Pulse Rate:  [52-102] 62 (01/27 0723) Resp:  [11-24] 16 (01/27 0723) BP: (120-177)/(69-98) 149/94 mmHg (01/27 0723) SpO2:  [94 %-100 %] 97 % (01/27 0723) Weight:  [71.3 kg (157 lb 3 oz)] 71.3 kg (157 lb 3 oz) (01/27 0350)  Intake/Output from previous day: 01/26 0701 - 01/27 0700 In: 1088 [I.V.:1088] Out: 925 [Urine:925] Intake/Output from this shift: Total I/O In: 174 [I.V.:174] Out: 100 [Urine:100]  Physical Exam: Neck: no adenopathy, no carotid bruit, no JVD and supple, symmetrical, trachea midline Lungs: Decreased breath sound at bases  Heart: irregularly irregular rhythm, S1, S2 normal and Soft systolic murmur noted Abdomen: soft, non-tender; bowel sounds normal; no masses,  no organomegaly Extremities: No clubbing cyanosis 2+ edema noted  Lab Results:  Recent Labs  05/03/13 0229 05/03/13 0238 05/04/13 0315  WBC 6.2  --  4.7  HGB 11.1* 12.9 9.4*  PLT 370  --  292    Recent Labs  05/03/13 0229 05/03/13 0238 05/04/13 0315  NA 144 143 148*  K 5.6* 5.3 4.6  CL 103 107 110  CO2 24  --  24  GLUCOSE 89 86 67*  BUN 57* 53* 56*  CREATININE 1.94* 2.00* 1.72*    Recent Labs  05/03/13 2316 05/04/13 0315  TROPONINI <0.30 <0.30   Hepatic Function Panel  Recent Labs  05/03/13 0229  PROT 7.9  ALBUMIN 3.6  AST 84*  ALT 59*  ALKPHOS 187*  BILITOT 1.3*   No results found for this basename: CHOL,  in the last 72 hours No results found for this basename: PROTIME,  in the last 72 hours  Imaging: Imaging results have been reviewed and Ct Head Wo Contrast  05/03/2013   CLINICAL DATA:  Altered mental status, combative.  EXAM: CT HEAD WITHOUT CONTRAST  TECHNIQUE: Contiguous axial images were obtained from the base of the skull through the vertex  without intravenous contrast.  COMPARISON:  CT of the head November 08, 2012.  FINDINGS: Moderate to severe ventriculomegaly, similar to prior examination with slight sulcal effacement of the convexities. No intraparenchymal hemorrhage, mass effect nor midline shift. Confluent supratentorial white matter hypodensities are unchanged. No acute large vascular territory infarcts. Bilateral patchy basal ganglia and the limits hypodensities.  No abnormal extra-axial fluid collections. Basal cisterns are patent. Severe calcific atherosclerosis of the carotid siphons and to lesser extent the vertebral arteries.  No skull fracture. Visualized paranasal sinuses and mastoid aircells are well-aerated. The included ocular globes and orbital contents are non-suspicious. Status post bilateral ocular lens implants.  IMPRESSION: No acute intracranial process.  Stable appearance of the head: Moderate to severe global brain atrophy, though there is mild effacement of the sulci at the convexities condyle which can be seen with normal pressure hydrocephalus. Severe white matter changes suggest chronic small vessel ischemic disease with patchy basal ganglia and thalamus hypodensities likely reflecting lacunar infarcts.   Electronically Signed   By: Awilda Metro   On: 05/03/2013 03:01   Dg Chest Port 1 View  05/03/2013   CLINICAL DATA:  Shortness of breath, confusion.  EXAM: PORTABLE CHEST - 1 VIEW  COMPARISON:  11/08/2012  FINDINGS: Cardiac enlargement. Aortic atherosclerosis. Interstitial prominence, senescent change. No definite consolidation. Small right effusion, with blunted costophrenic angle. Limited osseous evaluation  due to technique and osteopenia.  IMPRESSION: Blunted right costophrenic angle may reflect a small pleural effusion.  Prominent cardiac contour, similar to prior   Electronically Signed   By: Jearld LeschAndrew  DelGaizo M.D.   On: 05/03/2013 03:03    Cardiac Studies:  Assessment/Plan:  Decompensated systolic heart  failure improved New-onset A. fib with controlled ventricular response  Altered mental status workup in progress rule out sepsis  Hypertension  Hypothyroidism  Degenerative joint disease  Chronic anemia  History of hip fracture in the past and left supracondylar elbow fracture in the past  Dementia  Failure to thrive  Acute renal failure Plan Start Coumadin per pharmacy protocol Hold IV Lopressor Start low-dose amiodarone as per orders Avoid narcotics  LOS: 1 day    Neelie Welshans N 05/04/2013, 9:53 AM

## 2013-05-04 NOTE — Evaluation (Signed)
Physical Therapy Evaluation Patient Details Name: Danielle LombardMary G [name] MRN: [id number] DOB: 01/18/1917 Today's Date: 05/04/2013 Time: [id number] PT Time Calculation (min): 19 min  PT Assessment / Plan / Recommendation History of Present Illness  Danielle [name] is a 80 y.o. female with PMH history of congestive heart failure, hypertension and anemia after having hip surgery in August. She also had Left forearm fracture. According to the report of the paramedics the patient had blood work drawn and was found to have hyperkalemia with a potassium of 6.0 and acute renal failure with a creatinine of 1.88. Later during the evening the patient was found to be wandering the hallways in her wheelchair with altered mental status, saying nonsensical things and appeared altered  Clinical Impression  Pt with dementia at baseline limiting ability to provided PLOF. Suspect pt near or at baseline. Unclear of WBing status of L UE but suspect NWB or WBing only thru elbow as pt with L forearm splint. Pt remains to require totalAx2 for OOB mobility and would recommend at w/c as primary mode of mobility at this time. Pt appropriate [facility] once d/c'd from hospital.    PT Assessment  Patient needs continued PT services    Follow Up Recommendations  SNF    Does the patient have the potential to tolerate intense rehabilitation      Barriers to Discharge        Equipment Recommendations  None recommended by PT    Recommendations for Other Services OT consult (for splint check)   Frequency Min 2X/week    Precautions / Restrictions Precautions Precautions: Fall Required Braces or Orthoses: Other Brace/Splint Other Brace/Splint: L forearm splint Restrictions Weight Bearing Restrictions: Yes LUE Weight Bearing:  (suspect pt is WBing through L elbow or NWB at all )   Pertinent Vitals/Pain Denies pain      Mobility  Bed Mobility Overal bed mobility: Needs Assistance Bed  Mobility: Supine to Sit Supine to sit: Max assist;HOB elevated General bed mobility comments: pt able to assist with trunk elevation and LE management off bed, maxA to bring hips to EOB Transfers Overall transfer level: Needs assistance Equipment used: 2 person hand held assist Transfers: Stand Pivot Transfers Stand pivot transfers: Total assist;+2 physical assistance General transfer comment: pt unable to step towards chair    Exercises     PT Diagnosis: Difficulty walking;Generalized weakness  PT Problem [name]: Decreased strength;Decreased activity tolerance;Decreased balance;Decreased mobility PT Treatment Interventions: DME instruction;Gait training;Functional mobility training;Therapeutic activities;Therapeutic exercise     PT Goals(Current goals can be found in the care plan section) Acute Rehab PT Goals PT Goal Formulation: Patient unable to participate in goal setting Time For Goal Achievement: 05/11/13 Potential to Achieve Goals: Fair  Visit Information  Last PT Received On: 05/04/13 Assistance Needed: +2 History of Present Illness: Danielle [name] is a 80 y.o. female with PMH history of congestive heart failure, hypertension and anemia after having hip surgery in August. She also had Left forearm fracture. According to the report of the paramedics the patient had blood work drawn and was found to have hyperkalemia with a potassium of 6.0 and acute renal failure with a creatinine of 1.88. Later during the evening the patient was found to be wandering the hallways in her wheelchair with altered mental status, saying nonsensical things and appeared altered       Prior Functioning  Home Living Family/patient expects to be discharged to:: Skilled nursing facility Prior Function Level of  Independence: Needs assistance Gait / Transfers Assistance Needed: per chart pt in w/c at [facility] ADL's / Homemaking Assistance Needed: assist from [facility] staff Communication / Swallowing  Assistance Needed: altered diet - speech following Communication Communication: No difficulties (soft spoken) Dominant Hand: Right    Cognition  Cognition Arousal/Alertness: Awake/alert Behavior During Therapy: Anxious (can become tearful) Overall Cognitive Status: History of cognitive impairments - at baseline Memory: Decreased short-term memory    Extremity/Trunk Assessment Upper Extremity Assessment Upper Extremity Assessment: LUE deficits/detail LUE Deficits / Details: in splint, not ROM to wrist or elbow Lower Extremity Assessment Lower Extremity Assessment: Generalized weakness   Balance Balance Overall balance assessment: Needs assistance Sitting-balance support: Feet supported;Single extremity supported Sitting balance-Leahy Scale: Fair Sitting balance - Comments: increased trunk flexion, R lateral lean Standing balance support: Single extremity supported Standing balance-Leahy Scale: Zero Standing balance comment: unable to use L UE functionally  End of Session PT - End of Session Equipment Utilized During Treatment: Gait belt Activity Tolerance: Patient tolerated treatment well Patient left: in chair;with call bell/phone within reach (speech therapist present) Nurse Communication: Mobility status (how to transfer pt)  GP     Marcene Brawn 05/04/2013, 4:33 PM  Lewis Shock, PT, DPT Pager #: [phone] Office #: [phone]

## 2013-05-04 NOTE — Progress Notes (Signed)
ANTICOAGULATION CONSULT NOTE - Follow Up Consult  Pharmacy Consult for Heparin  Indication: atrial fibrillation  Allergies  Allergen Reactions  . Keflex [Cephalexin] Itching  . Penicillins Itching   Patient Measurements: Height: 5\' 2"  (157.5 cm) Weight: 157 lb 3 oz (71.3 kg) IBW/kg (Calculated) : 50.1 Heparin Dosing Weight: 64kg  Labs:  Recent Labs  05/03/13 0229 05/03/13 0238 05/03/13 1228 05/03/13 1855 05/03/13 2316 05/04/13 0315 05/04/13 0510  HGB 11.1* 12.9  --   --   --  9.4*  --   HCT 33.4* 38.0  --   --   --  28.7*  --   PLT 370  --   --   --   --  292  --   APTT  --   --   --  43*  --  136* >200*  HEPARINUNFRC  --   --   --  >2.20*  --  >2.20*  --   CREATININE 1.94* 2.00*  --   --   --  1.72*  --   TROPONINI  --   --  <0.30  --  <0.30 <0.30  --    Assessment: APTT now >200 sec, confirmed with RN that heparin level was again drawn incorrectly (for the second time) near the heparin infusion site. Called phlebotomy again to instruct them to draw from the opposite arm (confirmed with RN that patient can be stuck in opposite arm).   Goal of Therapy:  Heparin level 0.3-0.7 units/ml aPTT 66-102 seconds Monitor platelets by anticoagulation protocol: Yes   Plan:  Stat re-draw aPTT    Abran DukeLedford, Peytin Dechert 05/04/2013,7:13 AM

## 2013-05-05 DIAGNOSIS — E875 Hyperkalemia: Secondary | ICD-10-CM

## 2013-05-05 DIAGNOSIS — I5022 Chronic systolic (congestive) heart failure: Secondary | ICD-10-CM

## 2013-05-05 DIAGNOSIS — I10 Essential (primary) hypertension: Secondary | ICD-10-CM

## 2013-05-05 DIAGNOSIS — N390 Urinary tract infection, site not specified: Principal | ICD-10-CM

## 2013-05-05 DIAGNOSIS — I509 Heart failure, unspecified: Secondary | ICD-10-CM

## 2013-05-05 LAB — BASIC METABOLIC PANEL
BUN: 48 mg/dL — ABNORMAL HIGH (ref 6–23)
CO2: 24 mEq/L (ref 19–32)
Calcium: 8 mg/dL — ABNORMAL LOW (ref 8.4–10.5)
Chloride: 111 mEq/L (ref 96–112)
Creatinine, Ser: 1.53 mg/dL — ABNORMAL HIGH (ref 0.50–1.10)
GFR calc Af Amer: 32 mL/min — ABNORMAL LOW (ref >90)
GFR, EST NON AFRICAN AMERICAN: 28 mL/min — AB (ref >90)
GLUCOSE: 138 mg/dL — AB (ref 70–99)
POTASSIUM: 3.9 meq/L (ref 3.7–5.3)
Sodium: 148 mEq/L — ABNORMAL HIGH (ref 137–147)

## 2013-05-05 LAB — CBC
HEMATOCRIT: 30.4 % — AB (ref 36.0–46.0)
Hemoglobin: 10 g/dL — ABNORMAL LOW (ref 12.0–15.0)
MCH: 31.6 pg (ref 26.0–34.0)
MCHC: 32.9 g/dL (ref 30.0–36.0)
MCV: 96.2 fL (ref 78.0–100.0)
PLATELETS: 293 10*3/uL (ref 150–400)
RBC: 3.16 MIL/uL — AB (ref 3.87–5.11)
RDW: 18.2 % — ABNORMAL HIGH (ref 11.5–15.5)
WBC: 5.6 10*3/uL (ref 4.0–10.5)

## 2013-05-05 LAB — PROTIME-INR
INR: 1.48 (ref 0.00–1.49)
Prothrombin Time: 17.5 seconds — ABNORMAL HIGH (ref 11.6–15.2)

## 2013-05-05 LAB — APTT: aPTT: 106 seconds — ABNORMAL HIGH (ref 24–37)

## 2013-05-05 LAB — HEPARIN LEVEL (UNFRACTIONATED): Heparin Unfractionated: 1.02 IU/mL — ABNORMAL HIGH (ref 0.30–0.70)

## 2013-05-05 MED ORDER — LEVOFLOXACIN 500 MG PO TABS
500.0000 mg | ORAL_TABLET | ORAL | Status: DC
  Administered 2013-05-05: 500 mg via ORAL
  Filled 2013-05-05 (×2): qty 1

## 2013-05-05 MED ORDER — WARFARIN SODIUM 1 MG PO TABS
1.0000 mg | ORAL_TABLET | Freq: Once | ORAL | Status: DC
  Filled 2013-05-05: qty 1

## 2013-05-05 MED ORDER — ENSURE COMPLETE PO LIQD
237.0000 mL | Freq: Two times a day (BID) | ORAL | Status: DC
  Administered 2013-05-05 – 2013-05-06 (×2): 237 mL via ORAL

## 2013-05-05 MED ORDER — APIXABAN 2.5 MG PO TABS
2.5000 mg | ORAL_TABLET | Freq: Two times a day (BID) | ORAL | Status: DC
  Administered 2013-05-05 – 2013-05-06 (×2): 2.5 mg via ORAL
  Filled 2013-05-05 (×3): qty 1

## 2013-05-05 NOTE — Progress Notes (Signed)
Patient rested quietly for most of the night. Had periods of disorientation but was quickly calmed and returned to resting.

## 2013-05-05 NOTE — Progress Notes (Signed)
Foley cath = 14G d/c'd.  Pt tolerated well and instructed to call for assistance when ready to void.  Verbalized understanding.  Call bell at reach.  Amanda PeaNellie Dalaya Suppa, Charity fundraiserN.

## 2013-05-05 NOTE — Progress Notes (Signed)
[facility] Progress Note   Danielle [name]:[id number] DOB: [birth year] DOA: 05/03/2013 PCP: Pola Corn, MD  Brief narrative: Danielle [name] is a 80 y.o. female with PMH history of congestive heart failure, hypertension and anemia after having hip surgery in August. She also had Left forearm fracture. According to the report of the paramedics the patient had blood work drawn. She was found to have hyperkalemia with a potassium of 6.0 and acute renal failure with a creatinine of 1.88. Later during the evening the patient was found to be wandering the hallways in her wheelchair saying nonsensical things.   Subjective: Pt has no complaints; breathing comfortable and AAOX2 currently.  Assessment/Plan: Acute Encephalopathy with a h/o Dementia without behavioral disturbance - CT head negative - UTI may be the cause- although only 75, 000 colonies or multiple morphotypes in culture. -continue tx with Levaquin, now will switch to PO - other concerns will be dehydration and uremia from ARF; patient has been on IVF's for fluid resuscitation -Cr now 1.5 and BUN 48 -patient started to develop signs of fluid overload, will change IVF's to [facility]  Hypertension - controlled; will continue monitoring and continue current regimen  Acute renal failure -improved -Cr 1.5 now -continue holding diuretics -IVF's change to [facility] -patient with LE swelling 2-3 ++ and mild JVD  A-fib -management per cardiology -will continue  oral Amio and will start eliquis per pharmacy  Hyperkalemia - resolved  Hypernatremia - Na 148 -patient eating and drinking better -switch fluids to [facility]  Hypothermia - related to UTI most likely -resolved now -blood cultures negative -TSH normal  Dysphagia and nutrition: -on dysphagia type 2 diet    Code Status: DNR Family Communication: none Disposition Plan: back to SNF when medically stable (1-2 days) Palliative care also  following  Consultants: Cardiology   Procedures: none  Antibiotics: levaquin  DVT prophylaxis: Heparin   Objective: Blood pressure 156/94, pulse 69, temperature 97.3 F (36.3 C), temperature source Oral, resp. rate 14, height 5\' 2"  (1.575 m), weight 70.4 kg (155 lb 3.3 oz), SpO2 100.00%.  Intake/Output Summary (Last 24 hours) at 05/05/13 1731 Last data filed at 05/05/13 1554  Gross per 24 hour  Intake 2498.78 ml  Output   1020 ml  Net 1478.78 ml     Exam: General: oriented to person and placeonly, No acute respiratory distress; afebrile Lungs: Clear to auscultation bilaterally without wheezes or frank crackles Cardiovascular: Irregular rate and rhythm with positive murmur; no gallop or rub; mild JVD on exam Abdomen: Nontender, nondistended, soft, bowel sounds positive, no rebound, no ascites, no appreciable mass Extremities: positive 2-3 LE edema  Data Reviewed: Basic Metabolic Panel:  Recent Labs Lab 05/03/13 0229 05/03/13 0238 05/04/13 0315 05/05/13 0535  NA 144 143 148* 148*  K 5.6* 5.3 4.6 3.9  CL 103 107 110 111  CO2 24  --  24 24  GLUCOSE 89 86 67* 138*  BUN 57* 53* 56* 48*  CREATININE 1.94* 2.00* 1.72* 1.53*  CALCIUM 9.3  --  8.4 8.0*   Liver Function Tests:  Recent Labs Lab 05/03/13 0229  AST 84*  ALT 59*  ALKPHOS 187*  BILITOT 1.3*  PROT 7.9  ALBUMIN 3.6    Recent Labs Lab 05/03/13 1228  AMMONIA 49   CBC:  Recent Labs Lab 05/03/13 0229 05/03/13 0238 05/04/13 0315 05/05/13 0535  WBC 6.2  --  4.7 5.6  NEUTROABS 3.5  --   --   --   HGB 11.1* 12.9  9.4* 10.0*  HCT 33.4* 38.0 28.7* 30.4*  MCV 95.4  --  96.0 96.2  PLT 370  --  292 293   Cardiac Enzymes:  Recent Labs Lab 05/03/13 1228 05/03/13 2316 05/04/13 0315 05/04/13 1130  TROPONINI <0.30 <0.30 <0.30 <0.30   BNP (last 3 results)  Recent Labs  05/03/13 0229  PROBNP 9837.0*     Recent Results (from the past 240 hour(s))  URINE CULTURE     Status: None    Collection Time    05/03/13  4:06 AM      Result Value Range Status   Specimen Description URINE, CATHETERIZED   Final   Special Requests ADDED [id number]   Final   Culture  Setup Time     Final   Value: 05/03/2013 13:54     Performed at [facility]   Colony Count     Final   Value: 75,000 COLONIES/ML     Performed at [facility]   Culture     Final   Value: Multiple bacterial morphotypes present, none predominant. Suggest appropriate recollection if clinically indicated.     Performed at [facility]   Report Status 05/04/2013 FINAL   Final  MRSA PCR SCREENING     Status: Abnormal   Collection Time    05/03/13  9:12 AM      Result Value Range Status   MRSA by PCR POSITIVE (*) NEGATIVE Final   Comment:            The GeneXpert MRSA Assay (FDA     approved for NASAL specimens     only), is one component of a     comprehensive MRSA colonization     surveillance program. It is not     intended to diagnose MRSA     infection nor to guide or     monitor treatment for     MRSA infections.     RESULT CALLED TO, READ BACK BY AND VERIFIED WITH:     Mellody MemosP. COLQUHOUN RN 11:00 05/03/13 ([email or web address])  CULTURE, BLOOD (ROUTINE X 2)     Status: None   Collection Time    05/03/13 12:15 PM      Result Value Range Status   Specimen Description BLOOD RIGHT HAND   Final   Special Requests BOTTLES DRAWN AEROBIC ONLY 5CCS   Final   Culture  Setup Time     Final   Value: 05/03/2013 20:36     Performed at [facility]   Culture     Final   Value:        BLOOD CULTURE RECEIVED NO GROWTH TO DATE CULTURE WILL BE HELD FOR 5 DAYS BEFORE ISSUING A FINAL NEGATIVE REPORT     Performed at [facility]   Report Status PENDING   Incomplete  CULTURE, BLOOD (ROUTINE X 2)     Status: None   Collection Time    05/03/13 12:28 PM      Result Value Range Status   Specimen Description BLOOD RIGHT HAND   Final   Special Requests BOTTLES DRAWN AEROBIC ONLY 5CC   Final   Culture   Setup Time     Final   Value: 05/03/2013 20:38     Performed at [facility]   Culture     Final   Value:        BLOOD CULTURE RECEIVED NO GROWTH TO DATE CULTURE WILL BE HELD FOR 5 DAYS BEFORE  ISSUING A FINAL NEGATIVE REPORT     Performed at [facility]   Report Status PENDING   Incomplete     Scheduled Meds:  Scheduled Meds: . amiodarone  200 mg Oral Daily  . apixaban  2.5 mg Oral BID  . aspirin EC  81 mg Oral Daily  . Chlorhexidine Gluconate Cloth  6 each Topical Q0600  . collagenase  1 application Topical Daily  . docusate sodium  100 mg Oral BID  . feeding supplement (ENSURE COMPLETE)  237 mL Oral BID BM  . ferrous sulfate  325 mg Oral BID  . ketorolac  1 drop Right Eye QID  . metoprolol tartrate  12.5 mg Oral BID  . mupirocin ointment   Nasal BID   Continuous Infusions: . dextrose 1,000 mL (05/05/13 1543)    Time spent on care of this patient: >35   Abel Hageman, MD [phone]  05/05/2013, 5:31 PM   LOS: 2 days

## 2013-05-05 NOTE — Progress Notes (Addendum)
ANTICOAGULATION CONSULT NOTE - Follow Up Consult  Pharmacy Consult for heparin and coumadin Indication: atrial fibrillation  Allergies  Allergen Reactions  . Keflex [Cephalexin] Itching  . Penicillins Itching    Patient Measurements: Height: 5\' 2"  (157.5 cm) Weight: 155 lb 3.3 oz (70.4 kg) IBW/kg (Calculated) : 50.1 Heparin Dosing Weight: 64 kg  Vital Signs: Temp: 97.9 F (36.6 C) (01/28 0449) Temp src: Oral (01/28 0449) BP: 102/52 mmHg (01/28 0449) Pulse Rate: 79 (01/28 0449)  Labs:  Recent Labs  05/03/13 0229 05/03/13 0238  05/03/13 2316 05/04/13 0315  05/04/13 0815 05/04/13 1100 05/04/13 1130 05/04/13 1849 05/05/13 0535  HGB 11.1* 12.9  --   --  9.4*  --   --   --   --   --  10.0*  HCT 33.4* 38.0  --   --  28.7*  --   --   --   --   --  30.4*  PLT 370  --   --   --  292  --   --   --   --   --  293  APTT  --   --   < >  --  136*  < > >200*  --   --  165* 106*  LABPROT  --   --   --   --   --   --   --  28.5*  --   --  17.5*  INR  --   --   --   --   --   --   --  2.80*  --   --  1.48  HEPARINUNFRC  --   --   < >  --  >2.20*  --   --   --   --  >2.20* 1.02*  CREATININE 1.94* 2.00*  --   --  1.72*  --   --   --   --   --  1.53*  TROPONINI  --   --   < > <0.30 <0.30  --   --   --  <0.30  --   --   < > = values in this interval not displayed.  Estimated Creatinine Clearance: 20.7 ml/min (by C-G formula based on Cr of 1.53).  Assessment: 80 yo female on heparin and coumadin for afib.   Patient was on xarelto prior and per nursing home record, last dose was 05/02/13.  Her aPTT level is 106 and heparin level is 1.02.   APTT is slightly above goal of 66- 102.  Effect of xarelto is wearing off.  INR = 1.48 after first dose of 2 mg yesterday.  INR was 2.8 yesterday, it was elevated due to xarelto, not coumadin.  Suspect she will have delayed response to coumadin due to age.  Hg 10, pltc 293, no bleeding reported.   Goal of Therapy:  aPTT 66-102 seconds; heparin level  0.3-0.7 Monitor platelets by anticoagulation protocol: Yes   Plan:  1) Hold heparin drip for 1 hour, then reduce drip to 450 units/hr 2) 8hr aPTT and HL 3) coumadin 1 mg po x 1 dose today 4) daily HL and CBC and INR while on heparin and coumadin Herby AbrahamMichelle T. Averie Hornbaker, Pharm.D. [phone] 05/05/2013 8:41 AM  Pharmacy consulted to change heparin/coumadin to apixaban.   She has 2 of the 3 requirement for the decreased dose of 2.5 mg po BID (age > 80 and creat > 1.5)  Plan: DC heparin and coumadin Start apixaban 2.5  mg po BID DC HL, aPTTs, INRs, CBCs Herby Abraham, Pharm.D. [phone] 05/05/2013 2:45 PM

## 2013-05-05 NOTE — Progress Notes (Signed)
Patient was transferred to [facility] and report provided by Roddie McBryant Campbell, LCSWA.  CSW will follow and assist with return to SNF when medically stable per MD.  Lupita Leashonna T. West PughCrowder, LCSWA  [phone]

## 2013-05-05 NOTE — Evaluation (Signed)
Occupational Therapy Evaluation Patient Details Name: Danielle [name] MRN: [id number] DOB: [birth year] Today's Date: 05/05/2013 Time: 4970-2637 OT Time Calculation (min): 24 min  OT Assessment / Plan / Recommendation History of present illness Danielle [name] is a 80 y.o. female with PMH history of congestive heart failure, hypertension and anemia after having hip surgery in August. She also had Left forearm fracture. According to the report of the paramedics the patient had blood work drawn and was found to have hyperkalemia with a potassium of 6.0 and acute renal failure with a creatinine of 1.88. Later during the evening the patient was found to be wandering the hallways in her wheelchair with altered mental status, saying nonsensical things and appeared altered   Clinical Impression   Pt admitted with above.  OT was consulted to assess Splint Lt. UE.  Pt with posterior elbow blocking splint in place.  Splint removed and skin checked.  No evidence of redness, skin breakdown, or increased pain.  She does have localized edema at elbow. Splint reapplied.  Reviewed chart, phoned dtr, and phoned [facility] and spoke with the AD.  Splint is in place due to Supracondylar fracture found during last visit.  Splint is to remain in place at all times, except to bathe and to assess skin.  Spoke with RN who reports they will monitor it every shift, placed note above bed re: this, and placed a nursing order.  AD at [facility] have all orthopedic info and are aware of her next apt with orthopedist.  No further OT needs at this time.  Will sign off.      OT Assessment  All further OT needs can be met in the next venue of care    Follow Up Recommendations  SNF    Barriers to Discharge      Equipment Recommendations  None recommended by OT    Recommendations for Other Services    Frequency       Precautions / Restrictions Precautions Precautions: Fall Required Braces or Orthoses: Other Brace/Splint Other  Brace/Splint: L forearm splint Restrictions Weight Bearing Restrictions: Yes LUE Weight Bearing: Non weight bearing   Pertinent Vitals/Pain     ADL  ADL Comments: Reviewed chart.  Phoned Pt dtr who was unable to provide any info re: Lt. UE or splint.  Phoned [facility] and spoke with the AD.  She reports splint is due to elbow fracture in early January, and that splint is to remain in place to immobilize Lt. elbow.  They are aware of follow up apt with orthopedics and have been managing splint and will continue to do so when pt returns to their facility     OT Diagnosis:    OT Problem List:   OT Treatment Interventions:     OT Goals(Current goals can be found in the care plan section)    Visit Information  Last OT Received On: 05/05/13 Assistance Needed: +2 History of Present Illness: Danielle [name] is a 79 y.o. female with PMH history of congestive heart failure, hypertension and anemia after having hip surgery in August. She also had Left forearm fracture. According to the report of the paramedics the patient had blood work drawn and was found to have hyperkalemia with a potassium of 6.0 and acute renal failure with a creatinine of 1.88. Later during the evening the patient was found to be wandering the hallways in her wheelchair with altered mental status, saying nonsensical things and appeared altered  Prior [place] expects to be discharged to:: Skilled nursing facility Prior Function Level of Independence: Needs assistance Gait / Transfers Assistance Needed: per chart pt in w/c at [place] [facility] [place] Needed: assist from [facility] staff Communication / Swallowing Assistance Needed: altered diet - speech following Communication Communication: No difficulties (soft spoken) Dominant Hand: Right         Vision/Perception     Cognition  Cognition Arousal/Alertness: Lethargic Behavior During Therapy:  Anxious Overall Cognitive Status: History of cognitive impairments - at baseline    Extremity/Trunk Assessment Upper Extremity Assessment Upper Extremity Assessment: LUE deficits/detail LUE Deficits / Details: Pt with Lt elbow blocking splint in place.  Splint was removed, skin checked.  No evidence of redness.  Edema noted at elbow LUE: Unable to fully assess due to immobilization     Mobility       Exercise     Balance     End of Session OT - End of Session Activity Tolerance: Patient tolerated treatment well Patient left: in bed;with call bell/phone within reach  [place], Roxine Whittinghill M 05/05/2013, 12:43 PM

## 2013-05-05 NOTE — Progress Notes (Signed)
INITIAL NUTRITION ASSESSMENT  DOCUMENTATION CODES Per approved criteria  -Unable to assess   INTERVENTION: 1.  Supplements; Ensure Complete po BID, each supplement provides 350 kcal and 13 grams of protein 2.  GOC; nutrition interventions to remain appropriate and consistent with GOC.   NUTRITION DIAGNOSIS: Swallowing difficulty related to esophageal dysphagia, AMS as evidenced by SLP report.   Monitor:  1.  Food/Beverage; pt meeting >/=90% estimated needs with tolerance. 2.  Wt/wt change; monitor trends 3.  GOC; monitor for goals of care.   Reason for Assessment: MST  80 y.o. female  Admitting Dx: Encephalopathy  ASSESSMENT: Pt admitted with AMS.  Also with a fib in setting of decompensated HF. Pt evaluated by SLP who determined pt appropriate for Dysphagia 2, thin liquids. Pt consumed 75% of meal this AM. Stated she was "very hungry" during SLP visit.  Her wt is recently variable, but no wt loss. Daughter reported poor appetite PTA. PT in with pt at time of visit.  States pt remains confused, but can communicate immediate needs.  PMT also following pt for poor prognosis.  Planning to revisit with family today.  Pt is from NH with plans to return at d/c.   RD suspects pt may have underlying malnutrition or be at increased risk for malnutrition based on reported poor appetite and presence of edema, however pt with decompensated CHF and currently eating 75% of meal x1. No associated wt loss. Will continue to follow for ongoing interventions.   Height: Ht Readings from Last 1 Encounters:  05/03/13 5\' 2"  (1.575 m)    Weight: Wt Readings from Last 1 Encounters:  05/05/13 155 lb 3.3 oz (70.4 kg)    Ideal Body Weight: 110 lbs  % Ideal Body Weight: 140%  Wt Readings from Last 10 Encounters:  05/05/13 155 lb 3.3 oz (70.4 kg)  04/01/13 129 lb 3.2 oz (58.605 kg)  12/14/12 141 lb (63.957 kg)  11/09/12 126 lb 3.2 oz (57.244 kg)  11/09/12 126 lb 3.2 oz (57.244 kg)    Usual  Body Weight: 130 lbs per chart review  % Usual Body Weight: 119%  BMI:  Body mass index is 28.38 kg/(m^2).  Estimated Nutritional Needs: Kcal: 1300-1500 Protein: 60-75g Fluid: ~1.5 L/day  Skin: edema present  Diet Order: Dysphagia 2, thin  EDUCATION NEEDS: -No education needs identified at this time   Intake/Output Summary (Last 24 hours) at 05/05/13 1150 Last data filed at 05/05/13 1102  Gross per 24 hour  Intake 984.21 ml  Output   1400 ml  Net -415.79 ml    Last BM: PTA   Labs:   Recent Labs Lab 05/03/13 0229 05/03/13 0238 05/04/13 0315 05/05/13 0535  NA 144 143 148* 148*  K 5.6* 5.3 4.6 3.9  CL 103 107 110 111  CO2 24  --  24 24  BUN 57* 53* 56* 48*  CREATININE 1.94* 2.00* 1.72* 1.53*  CALCIUM 9.3  --  8.4 8.0*  GLUCOSE 89 86 67* 138*    CBG (last 3)  No results found for this basename: GLUCAP,  in the last 72 hours  Scheduled Meds: . amiodarone  200 mg Oral Daily  . aspirin EC  81 mg Oral Daily  . Chlorhexidine Gluconate Cloth  6 each Topical Q0600  . collagenase  1 application Topical Daily  . docusate sodium  100 mg Oral BID  . ferrous sulfate  325 mg Oral BID  . ketorolac  1 drop Right Eye QID  . metoprolol  tartrate  12.5 mg Oral BID  . mupirocin ointment   Nasal BID  . warfarin  1 mg Oral ONCE-1800  . Warfarin - Pharmacist Dosing Inpatient   Does not apply q1800    Continuous Infusions: . dextrose 1,000 mL (05/05/13 0756)  . heparin 450 Units/hr (05/05/13 0902)    Past Medical History  Diagnosis Date  . Coronary artery disease   . CHF (congestive heart failure)   . Hypertension   . Anemia     acute blood loss from hip surgery 11/2012  . A-fib     Past Surgical History  Procedure Laterality Date  . Femur im nail Left 11/09/2012    Procedure: INTRAMEDULLARY (IM) NAIL FEMORAL/Left;  Surgeon: Toni ArthursJohn Hewitt, MD;  Location: [facility] OR;  Service: Orthopedics;  Laterality: Left;    Loyce DysKacie Shantae Vantol, MS RD LDN Clinical Inpatient  Dietitian Pager: [phone] Weekend/After hours pager: [phone]

## 2013-05-05 NOTE — Progress Notes (Signed)
Ms. Danielle [name]'s daughter Rivka BarbaraGlenda did not want to discuss goals further today - she said that she could not make any decisions today. I will reassess tomorrow and speak with [name] to elicit goals.  Yong ChannelAlicia Persis Graffius, NP Palliative Medicine Team Pager # [phone] Team Phone # [phone]

## 2013-05-05 NOTE — Progress Notes (Signed)
Subjective:  Patient is alert awake do not recall what happened. Eating dinner on her own. Renal function improving   Objective:  Vital Signs in the last 24 hours: Temp:  [97.3 F (36.3 C)-97.9 F (36.6 C)] 97.3 F (36.3 C) (01/28 1410) Pulse Rate:  [69-86] 69 (01/28 1410) Resp:  [14-18] 14 (01/28 1410) BP: (98-156)/(44-94) 156/94 mmHg (01/28 1410) SpO2:  [97 %-100 %] 100 % (01/28 1410) Weight:  [70.4 kg (155 lb 3.3 oz)-70.7 kg (155 lb 13.8 oz)] 70.4 kg (155 lb 3.3 oz) (01/28 0449)  Intake/Output from previous day: 01/27 0701 - 01/28 0700 In: 618.2 [I.V.:618.2] Out: 1375 [Urine:1375] Intake/Output from this shift: Total I/O In: 2683.3 [P.O.:1020; I.V.:1663.3] Out: 520 [Urine:520]  Physical Exam: Neck: no adenopathy, no carotid bruit, no JVD and supple, symmetrical, trachea midline Lungs: Decreased breath sound at bases Heart: irregularly irregular rhythm, S1, S2 normal and Soft systolic murmur noted Abdomen: soft, non-tender; bowel sounds normal; no masses,  no organomegaly Extremities: No clubbing cyanosis 2+ edema noted  Lab Results:  Recent Labs  05/04/13 0315 05/05/13 0535  WBC 4.7 5.6  HGB 9.4* 10.0*  PLT 292 293    Recent Labs  05/04/13 0315 05/05/13 0535  NA 148* 148*  K 4.6 3.9  CL 110 111  CO2 24 24  GLUCOSE 67* 138*  BUN 56* 48*  CREATININE 1.72* 1.53*    Recent Labs  05/04/13 0315 05/04/13 1130  TROPONINI <0.30 <0.30   Hepatic Function Panel  Recent Labs  05/03/13 0229  PROT 7.9  ALBUMIN 3.6  AST 84*  ALT 59*  ALKPHOS 187*  BILITOT 1.3*   No results found for this basename: CHOL,  in the last 72 hours No results found for this basename: PROTIME,  in the last 72 hours  Imaging: Imaging results have been reviewed and No results found.  Cardiac Studies:  Assessment/Plan:  Resolving Decompensated systolic heart failure improved  New-onset A. fib with controlled ventricular response  Altered mental status workup in progress  rule out sepsis  Hypertension  Hypothyroidism  Degenerative joint disease  Chronic anemia  History of hip fracture in the past and left supracondylar elbow fracture in the past  Dementia  Failure to thrive  Resolving Acute renal failure Plan Continue present management Avoid overaggressive hydration Check labs in a.m.  LOS: 2 days    Kaena Santori N 05/05/2013, 6:46 PM

## 2013-05-06 DIAGNOSIS — R531 Weakness: Secondary | ICD-10-CM

## 2013-05-06 LAB — BASIC METABOLIC PANEL
BUN: 38 mg/dL — ABNORMAL HIGH (ref 6–23)
CO2: 25 mEq/L (ref 19–32)
Calcium: 7.9 mg/dL — ABNORMAL LOW (ref 8.4–10.5)
Chloride: 105 mEq/L (ref 96–112)
Creatinine, Ser: 1.31 mg/dL — ABNORMAL HIGH (ref 0.50–1.10)
GFR calc Af Amer: 39 mL/min — ABNORMAL LOW (ref >90)
GFR calc non Af Amer: 34 mL/min — ABNORMAL LOW (ref >90)
Glucose, Bld: 113 mg/dL — ABNORMAL HIGH (ref 70–99)
Potassium: 4.1 mEq/L (ref 3.7–5.3)
SODIUM: 142 meq/L (ref 137–147)

## 2013-05-06 MED ORDER — APIXABAN 2.5 MG PO TABS: 2.5000 mg | ORAL_TABLET | Freq: Two times a day (BID) | ORAL | Status: AC

## 2013-05-06 MED ORDER — ENSURE COMPLETE PO LIQD: 237.0000 mL | Freq: Two times a day (BID) | ORAL | Status: AC

## 2013-05-06 MED ORDER — LEVOFLOXACIN 500 MG PO TABS: 500.0000 mg | ORAL_TABLET | ORAL | Status: DC

## 2013-05-06 MED ORDER — POTASSIUM CHLORIDE CRYS ER 20 MEQ PO TBCR: 20.0000 meq | EXTENDED_RELEASE_TABLET | Freq: Every day | ORAL | Status: AC

## 2013-05-06 MED ORDER — AMIODARONE HCL 200 MG PO TABS: 200.0000 mg | ORAL_TABLET | Freq: Every day | ORAL | Status: AC

## 2013-05-06 NOTE — Care Management Note (Addendum)
  Page 1 of 1   05/06/2013     2:43:15 PM   CARE MANAGEMENT NOTE 05/06/2013  Patient:  Danielle [name],Danielle [name]   Account Number:  [id number]  Date Initiated:  05/04/2013  Documentation initiated by:  Donn PieriniWEBSTER,KRISTI  Subjective/Objective Assessment:   Pt admitted with AMS, hypothermia     Action/Plan:   [facility], CSW consulted for placement needs   Anticipated DC Date:  05/06/2013   Anticipated DC Plan:  SKILLED NURSING FACILITY  In-house referral  Clinical Social Worker      DC Planning Services  CM consult      Choice offered to / List presented to:             Status of service:  Completed, signed off Medicare Important Message given?   (If response is "NO", the following Medicare IM given date fields will be blank) Date Medicare IM given:   Date Additional Medicare IM given:    Discharge Disposition:  SKILLED NURSING FACILITY  Per UR Regulation:  Reviewed for med. necessity/level of care/duration of stay  If discussed at Long Length of Stay Meetings, dates discussed:    Comments:  05/06/2013 [facility] Irvin Bastin RN, BSN, [facility], CCM [phone] 05/06/2013

## 2013-05-06 NOTE — Progress Notes (Signed)
Progress Note from the Palliative Medicine Team at [facility]  Subjective: I spoke with Danielle [name] this morning. She is in good spirits and looking alert and smiling throughout our conversation. She asks for the bedpan and says that she is hungry and ready for breakfast. She tells me that she still feels weak and we discussed her heart being weak and her body being tired. She understands and tells me "I'm a [place] and when God calls me home, I'm ready to go." She also says that she never wants a feeding tube. I did speak with her daughter, Danielle [name], via telephone. Danielle [name] says that she finished reading [employer] Cho[employer] and found it very helpful. Danielle [name] tells me that she wants her mother to return to [place] and we agree to have palliative care services. Danielle [name] agrees she does not want her mother to come back and forth to the hospital and that we will treat the best we can at [facility] with the help of palliative care with an option to utilize residential hospice when appropriate for her care.    Objective: Allergies  Allergen Reactions  . Keflex [Cephalexin] Itching  . Penicillins Itching   Scheduled Meds: . amiodarone  200 mg Oral Daily  . apixaban  2.5 mg Oral BID  . aspirin EC  81 mg Oral Daily  . Chlorhexidine Gluconate Cloth  6 each Topical Q0600  . collagenase  1 application Topical Daily  . docusate sodium  100 mg Oral BID  . feeding supplement (ENSURE COMPLETE)  237 mL Oral BID BM  . ferrous sulfate  325 mg Oral BID  . ketorolac  1 drop Right Eye QID  . levofloxacin  500 mg Oral Q48H  . metoprolol tartrate  12.5 mg Oral BID  . mupirocin ointment   Nasal BID   Continuous Infusions: . dextrose 1,000 mL (05/05/13 1543)   PRN Meds:.acetaminophen, bisacodyl, nitroGLYCERIN, ondansetron (ZOFRAN) IV, ondansetron  BP 141/90  Pulse 80  Temp(Src) 98 F (36.7 C) (Axillary)  Resp 18  Ht 5\' 2"  (1.575 m)  Wt 70.2 kg (154 lb 12.2 oz)  BMI 28.30 kg/m2  SpO2 100%   PPS:  30%    Intake/Output Summary (Last 24 hours) at 05/06/13 0947 Last data filed at 05/05/13 2230  Gross per 24 hour  Intake 2623.28 ml  Output    520 ml  Net 2103.28 ml      LBM: 05/03/13     Physical Exam:  General: NAD, smiling, pleasant HEENT: [place]/AT, no JVD, mucous membranes moist Chest: CTA throughout, decreased bases, no labored breathing CVS: Irreg - A fib, S1 S2 Abdomen: Soft, NT, ND, +BS Ext: BLE edema 2+ but improved, skin dry Neuro: Alert, oriented to person/place  Labs: CBC    Component Value Date/Time   WBC 5.6 05/05/2013 0535   RBC 3.16* 05/05/2013 0535   HGB 10.0* 05/05/2013 0535   HCT 30.4* 05/05/2013 0535   PLT 293 05/05/2013 0535   MCV 96.2 05/05/2013 0535   MCH 31.6 05/05/2013 0535   MCHC 32.9 05/05/2013 0535   RDW 18.2* 05/05/2013 0535   LYMPHSABS 1.9 05/03/2013 0229   MONOABS 0.8 05/03/2013 0229   EOSABS 0.0 05/03/2013 0229   BASOSABS 0.0 05/03/2013 0229    BMET    Component Value Date/Time   NA 142 05/06/2013 0525   K 4.1 05/06/2013 0525   CL 105 05/06/2013 0525   CO2 25 05/06/2013 0525   GLUCOSE 113* 05/06/2013 0525   BUN 38* 05/06/2013 0525  CREATININE 1.31* 05/06/2013 0525   CALCIUM 7.9* 05/06/2013 0525   GFRNONAA 34* 05/06/2013 0525   GFRAA 39* 05/06/2013 0525    CMP     Component Value Date/Time   NA 142 05/06/2013 0525   K 4.1 05/06/2013 0525   CL 105 05/06/2013 0525   CO2 25 05/06/2013 0525   GLUCOSE 113* 05/06/2013 0525   BUN 38* 05/06/2013 0525   CREATININE 1.31* 05/06/2013 0525   CALCIUM 7.9* 05/06/2013 0525   PROT 7.9 05/03/2013 0229   ALBUMIN 3.6 05/03/2013 0229   AST 84* 05/03/2013 0229   ALT 59* 05/03/2013 0229   ALKPHOS 187* 05/03/2013 0229   BILITOT 1.3* 05/03/2013 0229   GFRNONAA 34* 05/06/2013 0525   GFRAA 39* 05/06/2013 0525     Assessment and Plan: 1. Code Status: DNR 2. Symptom Control: 1. Pain: Acetaminophen prn. 2. Bowel Regimen: Dulcolax supp prn. 3. Nausea: Ondansetron prn. 4. Weakness: PT following.  3. Psycho/Social:  Emotional support provided to patient and daughter Danielle BarbaraGlenda. 4. Spiritual: Appreciates visits from chaplain. 5. Disposition: Return to [facility] with Palliative Care Services.    Time In Time Out Total Time Spent with Patient Total Overall Time  0930 1000 20min 30min    Greater than 50%  of this time was spent counseling and coordinating care related to the above assessment and plan.  Yong ChannelAlicia Shann Lewellyn, NP Palliative Medicine Team Pager # [phone] Team Phone # [phone]

## 2013-05-06 NOTE — Progress Notes (Signed)
The patient is A&Ox4, but has bouts of confusion and forgetfulness at times.  She also becomes very anxious at times, but this behavior quickly subsides with comforting measures.

## 2013-05-06 NOTE — Discharge Summary (Signed)
Physician Discharge Summary  Danielle LombardMary G Lamy AVW:[id number] DOB: 07/06/1916 DOA: 05/03/2013  PCP: Pola CornSPRUILL,JEROME O, MD  Admit date: 05/03/2013 Discharge date: 05/06/2013  Time spent: >30 minutes  Recommendations for Outpatient Follow-up:  1. BMET to follow electrolytes and renal function 2. CBC to follow Hgb 3. Palliative to follow patient; plan is for transition to comfort and hospice if further decline   Discharge Diagnoses:  Principal Problem:   Encephalopathy Active Problems:   Hypertension   Dementia without behavioral disturbance   Altered mental state   Acute renal failure   Hypothermia   Palliative care encounter   Atrial fibrillation   Weakness generalized   Discharge Condition: stable and improved. Will discharge back to SNF with palliative care following patient at facility.  Diet recommendation: low sodium diet; dysphagia 2 with crushed meds  Filed Weights   05/04/13 1954 05/05/13 0449 05/06/13 0459  Weight: 70.7 kg (155 lb 13.8 oz) 70.4 kg (155 lb 3.3 oz) 70.2 kg (154 lb 12.2 oz)    History of present illness:  80 y.o. female with PMH history of congestive heart failure, hypertension and anemia after having hip surgery in August. She also had Left forearm fracture. According to the report of the paramedics the patient had blood work drawn and was found to have hyperkalemia with a potassium of 6.0 and acute renal failure with a creatinine of 1.88. Later during the evening the patient was found to be wandering the hallways in her wheelchair with altered mental status, saying nonsensical things and appeared altered. Patient is unable to give a clear history due to altered mental status. History was obtain from ED records.  Patient at time of my evaluation is sedated, move extremities at times. Patient received Ativan 1 mg in the ED because she was agitated. She had a CT head that was negative for acute intracranial process. She was found to be hypothermic. Cr at 2.0, K at  5.3. UA negative for infection. Chest X Ray; Blunted right costophrenic angle may reflect a small pleural effusion   Hospital Course:  Acute Encephalopathy with a h/o Dementia without behavioral disturbance  - CT head negative  - UTI may be the cause- although only 75, 000 colonies of multiple morphotypes in culture.  -continue tx with Levaquin, now will switch to PO (dose renally adjusted) - other concerns will be dehydration and uremia from ARF; resolved after IVF's resuscitation given. -Cr now 1.3 at discharge  -will transfer back to SNF, resume medications and as part of GOC meeting patient now DNR with plans for no further readmissions and if continue declining transition to palliative care -palliative care will follow her at SNF   Hypertension  - controlled; will continue monitoring and continue current regimen   Acute renal failure : due to dehydration, continue use of nephrotoxic agents and UTI) -improved and at discharge Cr 1.3 -will resume diuretics -stop lisinopril (renal failure potential and hyperkalemia) -continue holding diuretics   A-fib  -following cardiology recommendations  -will continue oral Amio and will start eliquis per pharmacy for anticoagulation  Hyperkalemia  - resolved  -lisinopril now discontinued -lasix resume and potassium supplementation adjusted  Hypernatremia  - Na 142 at discharge -resolved with IVF's -patient eating and drinking better   Hypothermia  - related to UTI most likely  -resolved now  -blood cultures negative  -TSH normal   Dysphagia and nutrition:  -on dysphagia type 2 diet -started on ensure  Elbow fracture -healing -will continue splint as previously instructed -  pain well controlled with tylenol   Procedures: See below for x-ray reports  Consultations:  Cardiology  Palliative care  Discharge Exam: Filed Vitals:   05/06/13 0459  BP: 141/90  Pulse: 80  Temp: 98 F (36.7 C)  Resp: 18   General: AAOX2  (baseline per daughter), No acute respiratory distress; afebrile and wants to go back to facility  Lungs: Clear to auscultation bilaterally without wheezes or crackles  Cardiovascular: Irregular rate and rhythm with positive murmur; no gallop or rub; no JVD on exam  Abdomen: Nontender, nondistended, soft, bowel sounds positive, no rebound, no ascites, no appreciable mass  Extremities: positive 1-2 LE edema  Discharge Instructions  Discharge Orders   Future Orders Complete By Expires   Diet - low sodium heart healthy  As directed    Discharge instructions  As directed    Comments:     Follow a low sodium diet Palliative care to follow patient at SNF; plan is for no more readmissions and to transition to hospice with comfort care if patient declines. Provide medications as prescribed Dysphagia type 2 diet with crushed meds BMET in 5 days       Medication List    STOP taking these medications       aspirin EC 81 MG tablet     lisinopril 20 MG tablet  Commonly known as:  PRINIVIL,ZESTRIL     Rivaroxaban 20 MG Tabs tablet  Commonly known as:  XARELTO      TAKE these medications       acetaminophen 325 MG tablet  Commonly known as:  TYLENOL  Take 650 mg by mouth every 6 (six) hours as needed for mild pain or moderate pain.     amiodarone 200 MG tablet  Commonly known as:  PACERONE  Take 1 tablet (200 mg total) by mouth daily.     apixaban 2.5 MG Tabs tablet  Commonly known as:  ELIQUIS  Take 1 tablet (2.5 mg total) by mouth 2 (two) times daily.     collagenase ointment  Commonly known as:  SANTYL  Apply 1 application topically daily.     feeding supplement (ENSURE COMPLETE) Liqd  Take 237 mLs by mouth 2 (two) times daily between meals.     furosemide 20 MG tablet  Commonly known as:  LASIX  Take 20 mg by mouth 2 (two) times daily.     Iron 325 (65 FE) MG Tabs  Take 1 tablet by mouth 2 (two) times daily.     ketorolac 0.5 % ophthalmic solution  Commonly known  as:  ACULAR  Place 1 drop into the right eye 4 (four) times daily.     levofloxacin 500 MG tablet  Commonly known as:  LEVAQUIN  Take 1 tablet (500 mg total) by mouth every other day. Give every other day starting on the 30 and give a total of 3 more doses.     metoprolol succinate 25 MG 24 hr tablet  Commonly known as:  TOPROL-XL  Take 25 mg by mouth 2 (two) times daily.     multivitamin with minerals tablet  Take 1 tablet by mouth daily.     nitroGLYCERIN 0.4 MG SL tablet  Commonly known as:  NITROSTAT  Place 0.4 mg under the tongue every 5 (five) minutes as needed for chest pain.     potassium chloride SA 20 MEQ tablet  Commonly known as:  K-DUR,KLOR-CON  Take 1 tablet (20 mEq total) by mouth daily. Crushed and give  with apple sauce.     vitamin C 500 MG tablet  Commonly known as:  ASCORBIC ACID  Take 500 mg by mouth daily.       Allergies  Allergen Reactions  . Keflex [Cephalexin] Itching  . Penicillins Itching       Follow-up Information   Follow up with Pola Corn, MD. Schedule an appointment as soon as possible for a visit in 1 week.   Specialty:  Cardiology   Contact information:   [address] [place] [address] [phone]       The results of significant diagnostics from this hospitalization (including imaging, microbiology, ancillary and laboratory) are listed below for reference.    Significant Diagnostic Studies: Ct Head Wo Contrast  05/03/2013   CLINICAL DATA:  Altered mental status, combative.  EXAM: CT HEAD WITHOUT CONTRAST  TECHNIQUE: Contiguous axial images were obtained from the base of the skull through the vertex without intravenous contrast.  COMPARISON:  CT of the head November 08, 2012.  FINDINGS: Moderate to severe ventriculomegaly, similar to prior examination with slight sulcal effacement of the convexities. No intraparenchymal hemorrhage, mass effect nor midline shift. Confluent supratentorial white matter hypodensities are  unchanged. No acute large vascular territory infarcts. Bilateral patchy basal ganglia and the limits hypodensities.  No abnormal extra-axial fluid collections. Basal cisterns are patent. Severe calcific atherosclerosis of the carotid siphons and to lesser extent the vertebral arteries.  No skull fracture. Visualized paranasal sinuses and mastoid aircells are well-aerated. The included ocular globes and orbital contents are non-suspicious. Status post bilateral ocular lens implants.  IMPRESSION: No acute intracranial process.  Stable appearance of the head: Moderate to severe global brain atrophy, though there is mild effacement of the sulci at the convexities condyle which can be seen with normal pressure hydrocephalus. Severe white matter changes suggest chronic small vessel ischemic disease with patchy basal ganglia and thalamus hypodensities likely reflecting lacunar infarcts.   Electronically Signed   By: Awilda Metro   On: 05/03/2013 03:01   Dg Chest Port 1 View  05/03/2013   CLINICAL DATA:  Shortness of breath, confusion.  EXAM: PORTABLE CHEST - 1 VIEW  COMPARISON:  11/08/2012  FINDINGS: Cardiac enlargement. Aortic atherosclerosis. Interstitial prominence, senescent change. No definite consolidation. Small right effusion, with blunted costophrenic angle. Limited osseous evaluation due to technique and osteopenia.  IMPRESSION: Blunted right costophrenic angle may reflect a small pleural effusion.  Prominent cardiac contour, similar to prior   Electronically Signed   By: Jearld Lesch M.D.   On: 05/03/2013 03:03    Microbiology: Recent Results (from the past 240 hour(s))  URINE CULTURE     Status: None   Collection Time    05/03/13  4:06 AM      Result Value Range Status   Specimen Description URINE, CATHETERIZED   Final   Special Requests ADDED [id number] [phone]   Final   Culture  Setup Time     Final   Value: 05/03/2013 13:54     Performed at [facility]   Colony Count     Final    Value: 75,000 COLONIES/ML     Performed at [facility]   Culture     Final   Value: Multiple bacterial morphotypes present, none predominant. Suggest appropriate recollection if clinically indicated.     Performed at [facility]   Report Status 05/04/2013 FINAL   Final  MRSA PCR SCREENING     Status: Abnormal   Collection  Time    05/03/13  9:12 AM      Result Value Range Status   MRSA by PCR POSITIVE (*) NEGATIVE Final   Comment:            The GeneXpert MRSA Assay (FDA     approved for NASAL specimens     only), is one component of a     comprehensive MRSA colonization     surveillance program. It is not     intended to diagnose MRSA     infection nor to guide or     monitor treatment for     MRSA infections.     RESULT CALLED TO, READ BACK BY AND VERIFIED WITH:     Mellody Memos RN 11:00 05/03/13 ([email or web address])  CULTURE, BLOOD (ROUTINE X 2)     Status: None   Collection Time    05/03/13 12:15 PM      Result Value Range Status   Specimen Description BLOOD RIGHT HAND   Final   Special Requests BOTTLES DRAWN AEROBIC ONLY 5CCS   Final   Culture  Setup Time     Final   Value: 05/03/2013 20:36     Performed at [facility]   Culture     Final   Value:        BLOOD CULTURE RECEIVED NO GROWTH TO DATE CULTURE WILL BE HELD FOR 5 DAYS BEFORE ISSUING A FINAL NEGATIVE REPORT     Performed at [facility]   Report Status PENDING   Incomplete  CULTURE, BLOOD (ROUTINE X 2)     Status: None   Collection Time    05/03/13 12:28 PM      Result Value Range Status   Specimen Description BLOOD RIGHT HAND   Final   Special Requests BOTTLES DRAWN AEROBIC ONLY 5CC   Final   Culture  Setup Time     Final   Value: 05/03/2013 20:38     Performed at [facility]   Culture     Final   Value:        BLOOD CULTURE RECEIVED NO GROWTH TO DATE CULTURE WILL BE HELD FOR 5 DAYS BEFORE ISSUING A FINAL NEGATIVE REPORT     Performed at [facility]   Report Status  PENDING   Incomplete     Labs: Basic Metabolic Panel:  Recent Labs Lab 05/03/13 0229 05/03/13 0238 05/04/13 0315 05/05/13 0535 05/06/13 0525  NA 144 143 148* 148* 142  K 5.6* 5.3 4.6 3.9 4.1  CL 103 107 110 111 105  CO2 24  --  24 24 25   GLUCOSE 89 86 67* 138* 113*  BUN 57* 53* 56* 48* 38*  CREATININE 1.94* 2.00* 1.72* 1.53* 1.31*  CALCIUM 9.3  --  8.4 8.0* 7.9*   Liver Function Tests:  Recent Labs Lab 05/03/13 0229  AST 84*  ALT 59*  ALKPHOS 187*  BILITOT 1.3*  PROT 7.9  ALBUMIN 3.6    Recent Labs Lab 05/03/13 1228  AMMONIA 49   CBC:  Recent Labs Lab 05/03/13 0229 05/03/13 0238 05/04/13 0315 05/05/13 0535  WBC 6.2  --  4.7 5.6  NEUTROABS 3.5  --   --   --   HGB 11.1* 12.9 9.4* 10.0*  HCT 33.4* 38.0 28.7* 30.4*  MCV 95.4  --  96.0 96.2  PLT 370  --  292 293   Cardiac Enzymes:  Recent Labs Lab 05/03/13 1228 05/03/13 2316 05/04/13 0315 05/04/13 1130  TROPONINI <0.30 <0.30 <0.30 <0.30   BNP: BNP (last 3 results)  Recent Labs  05/03/13 0229  PROBNP 9837.0*    Signed:  Shakeria Robinette  [facility] 05/06/2013, 10:58 AM

## 2013-05-06 NOTE — Progress Notes (Signed)
Report called to charge RN at [facility].  Patient transferred via ambulance this afternoon.  Splint remains in place on L arm.  IV removed prior to discharge; IV site clean, dry, and intact.  Vital signs stable prior to discharge; patient on room air.

## 2013-05-06 NOTE — Progress Notes (Signed)
Subjective:   Patient denies any chest pain or shortness of breath  Objective:  Vital Signs in the last 24 hours: Temp:  [97.3 F (36.3 C)-98 F (36.7 C)] 98 F (36.7 C) (01/29 0459) Pulse Rate:  [69-80] 80 (01/29 0459) Resp:  [14-18] 18 (01/29 0459) BP: (141-156)/(90-94) 141/90 mmHg (01/29 0459) SpO2:  [100 %] 100 % (01/29 0459) Weight:  [70.2 kg (154 lb 12.2 oz)] 70.2 kg (154 lb 12.2 oz) (01/29 0459)  Intake/Output from previous day: 01/28 0701 - 01/29 0700 In: 2803.3 [P.O.:1140; I.V.:1663.3] Out: 520 [Urine:520] Intake/Output from this shift: Total I/O In: 240 [P.O.:240] Out: 0   Physical Exam: Neck: no adenopathy, no carotid bruit, no JVD and supple, symmetrical, trachea midline Lungs: Decrease  breath sound at bases  Heart: irregularly irregular rhythm, S1, S2 normal and Soft systolic murmur noted Abdomen: soft, non-tender; bowel sounds normal; no masses,  no organomegaly Extremities: No clubbing cyanosis 2+ edema  Lab Results:  Recent Labs  05/04/13 0315 05/05/13 0535  WBC 4.7 5.6  HGB 9.4* 10.0*  PLT 292 293    Recent Labs  05/05/13 0535 05/06/13 0525  NA 148* 142  K 3.9 4.1  CL 111 105  CO2 24 25  GLUCOSE 138* 113*  BUN 48* 38*  CREATININE 1.53* 1.31*    Recent Labs  05/04/13 0315 05/04/13 1130  TROPONINI <0.30 <0.30   Hepatic Function Panel No results found for this basename: PROT, ALBUMIN, AST, ALT, ALKPHOS, BILITOT, BILIDIR, IBILI,  in the last 72 hours No results found for this basename: CHOL,  in the last 72 hours No results found for this basename: PROTIME,  in the last 72 hours  Imaging: Imaging results have been reviewed and No results found.  Cardiac Studies:  Assessment/Plan:  Resolving Decompensated systolic heart failure improved  New-onset A. fib with controlled ventricular response  Altered mental status workup in progress rule out sepsis  Hypertension  Hypothyroidism  Degenerative joint disease  Chronic anemia   History of hip fracture in the past and left supracondylar elbow fracture in the past  Dementia  Failure to thrive  Resolving Acute renal failure Plan Continue present management Check PT/INR as outpatient keep INR between 2-2.5   LOS: 3 days    Danielle [name] N 05/06/2013, 12:11 PM

## 2013-05-07 ENCOUNTER — Non-Acute Institutional Stay (SKILLED_NURSING_FACILITY): Payer: Medicare Other | Admitting: Nurse Practitioner

## 2013-05-07 ENCOUNTER — Encounter: Payer: Self-pay | Admitting: Nurse Practitioner

## 2013-05-07 DIAGNOSIS — E039 Hypothyroidism, unspecified: Secondary | ICD-10-CM

## 2013-05-07 DIAGNOSIS — I10 Essential (primary) hypertension: Secondary | ICD-10-CM

## 2013-05-07 DIAGNOSIS — I509 Heart failure, unspecified: Secondary | ICD-10-CM

## 2013-05-07 DIAGNOSIS — Z515 Encounter for palliative care: Secondary | ICD-10-CM

## 2013-05-07 DIAGNOSIS — F0390 Unspecified dementia without behavioral disturbance: Secondary | ICD-10-CM

## 2013-05-07 DIAGNOSIS — N390 Urinary tract infection, site not specified: Secondary | ICD-10-CM

## 2013-05-07 DIAGNOSIS — I5022 Chronic systolic (congestive) heart failure: Secondary | ICD-10-CM

## 2013-05-07 DIAGNOSIS — I4891 Unspecified atrial fibrillation: Secondary | ICD-10-CM

## 2013-05-07 DIAGNOSIS — S42412D Displaced simple supracondylar fracture without intercondylar fracture of left humerus, subsequent encounter for fracture with routine healing: Secondary | ICD-10-CM

## 2013-05-07 DIAGNOSIS — K5900 Constipation, unspecified: Secondary | ICD-10-CM

## 2013-05-07 DIAGNOSIS — S42309D Unspecified fracture of shaft of humerus, unspecified arm, subsequent encounter for fracture with routine healing: Secondary | ICD-10-CM

## 2013-05-07 DIAGNOSIS — N179 Acute kidney failure, unspecified: Secondary | ICD-10-CM

## 2013-05-07 NOTE — Consult Note (Signed)
I have reviewed this case with our NP and agree with the Assessment and Plan as stated.  Jolyssa Oplinger L. Carter Kassel, MD MBA The Palliative Medicine Team at [place] Team Phone: [phone] Pager: [phone]   

## 2013-05-07 NOTE — Progress Notes (Signed)
Patient ID: Danielle [name], female   DOB: [birth year], 80 y.o.   MRN: [id number]    Nursing Home Location:  [facility]   Place of Service: SNF (31)  PCP: Pola Corn, MD  Allergies  Allergen Reactions  . Keflex [Cephalexin] Itching  . Penicillins Itching    Chief Complaint  Patient presents with  . Hospitalization Follow-up    HPI:  80 y.o. female with PMH history of congestive heart failure, hypertension and anemia, Left forearm fracture who has been re-admitted to [facility] after hospital stay for acute encephalopathy with UTI and ARF. Pt seems to be doing well since she is back at [facility]; palliative care was consulted in the hospital and will cont to follow at [facility]; nursing without concerns at this time. Pt is a poor historian but does not have any complaints during visit  Review of Systems:  Review of Systems  Constitutional: Negative for fever, chills and malaise/fatigue.  Respiratory: Negative for cough, shortness of breath and wheezing.   Cardiovascular: Positive for leg swelling. Negative for chest pain and palpitations.  Gastrointestinal: Negative for heartburn, abdominal pain and constipation.  Genitourinary: Negative for dysuria.  Musculoskeletal: Negative for joint pain and myalgias.  Skin: Negative.   Neurological: Negative for headaches.  Psychiatric/Behavioral: Negative for depression. The patient does not have insomnia.      Past Medical History  Diagnosis Date  . Coronary artery disease   . CHF (congestive heart failure)   . Hypertension   . Anemia     acute blood loss from hip surgery 11/2012  . A-fib    Past Surgical History  Procedure Laterality Date  . Femur im nail Left 11/09/2012    Procedure: INTRAMEDULLARY (IM) NAIL FEMORAL/Left;  Surgeon: Toni Arthurs, MD;  Location: [facility] OR;  Service: Orthopedics;  Laterality: Left;   Social History:   reports that she has never smoked. She has never used smokeless tobacco. She  reports that she does not drink alcohol. Her drug history is not on file.  No family history on file.  Medications: Patient's Medications  New Prescriptions   No medications on file  Previous Medications   ACETAMINOPHEN (TYLENOL) 325 MG TABLET    Take 650 mg by mouth every 6 (six) hours as needed for mild pain or moderate pain.   AMIODARONE (PACERONE) 200 MG TABLET    Take 1 tablet (200 mg total) by mouth daily.   APIXABAN (ELIQUIS) 2.5 MG TABS TABLET    Take 1 tablet (2.5 mg total) by mouth 2 (two) times daily.   COLLAGENASE (SANTYL) OINTMENT    Apply 1 application topically daily.   FEEDING SUPPLEMENT, ENSURE COMPLETE, (ENSURE COMPLETE) LIQD    Take 237 mLs by mouth 2 (two) times daily between meals.   FERROUS SULFATE (IRON) 325 (65 FE) MG TABS    Take 1 tablet by mouth 2 (two) times daily.   FUROSEMIDE (LASIX) 20 MG TABLET    Take 20 mg by mouth 2 (two) times daily.    KETOROLAC (ACULAR) 0.5 % OPHTHALMIC SOLUTION    Place 1 drop into the right eye 4 (four) times daily.   LEVOFLOXACIN (LEVAQUIN) 500 MG TABLET    Take 1 tablet (500 mg total) by mouth every other day. Give every other day starting on the 30 and give a total of 3 more doses.   METOPROLOL SUCCINATE (TOPROL-XL) 25 MG 24 HR TABLET    Take 25 mg by mouth 2 (two) times daily.  MULTIPLE VITAMINS-MINERALS (MULTIVITAMIN WITH MINERALS) TABLET    Take 1 tablet by mouth daily.   NITROGLYCERIN (NITROSTAT) 0.4 MG SL TABLET    Place 0.4 mg under the tongue every 5 (five) minutes as needed for chest pain.   POTASSIUM CHLORIDE SA (K-DUR,KLOR-CON) 20 MEQ TABLET    Take 1 tablet (20 mEq total) by mouth daily. Crushed and give with apple sauce.   VITAMIN C (ASCORBIC ACID) 500 MG TABLET    Take 500 mg by mouth daily.  Modified Medications   No medications on file  Discontinued Medications   No medications on file     Physical Exam:  Filed Vitals:   05/07/13 1242  BP: 155/78  Pulse: 89  Temp: 98.5 F (36.9 C)  Resp: 20     Physical Exam  Constitutional: She is well-developed, well-nourished, and in no distress.  HENT:  Head: Normocephalic and atraumatic.  Mouth/Throat: Oropharynx is clear and moist. No oropharyngeal exudate.  Neck: Normal range of motion. Neck supple. No JVD present. No thyromegaly present.  Cardiovascular: Normal rate, regular rhythm and normal heart sounds.   Pulmonary/Chest: Effort normal and breath sounds normal. No respiratory distress.  Abdominal: Soft. Bowel sounds are normal. She exhibits no distension. There is no tenderness.  Musculoskeletal: She exhibits edema (2+).  Neurological: She is alert.  Skin: Skin is warm and dry.  Heel wounds; followed by treatment nurse  Psychiatric: Affect normal.     Labs reviewed: Basic Metabolic Panel:  Recent Labs  16/01/9600/27/15 0315 05/05/13 0535 05/06/13 0525  NA 148* 148* 142  K 4.6 3.9 4.1  CL 110 111 105  CO2 24 24 25   GLUCOSE 67* 138* 113*  BUN 56* 48* 38*  CREATININE 1.72* 1.53* 1.31*  CALCIUM 8.4 8.0* 7.9*   Liver Function Tests:  Recent Labs  11/08/12 1851 03/31/13 1648 05/03/13 0229  AST 58* 52* 84*  ALT 16 23 59*  ALKPHOS 85 90 187*  BILITOT 0.7 0.9 1.3*  PROT 7.7 6.3 7.9  ALBUMIN 3.5 2.9* 3.6    Recent Labs  03/31/13 1648  LIPASE 18    Recent Labs  05/03/13 1228  AMMONIA 49   CBC:  Recent Labs  11/08/12 1851  03/31/13 1648  05/03/13 0229 05/03/13 0238 05/04/13 0315 05/05/13 0535  WBC 12.4*  < > 9.3  < > 6.2  --  4.7 5.6  NEUTROABS 9.9*  --  7.0  --  3.5  --   --   --   HGB 11.0*  < > 11.3*  < > 11.1* 12.9 9.4* 10.0*  HCT 31.0*  < > 33.7*  < > 33.4* 38.0 28.7* 30.4*  MCV 88.6  < > 91.8  < > 95.4  --  96.0 96.2  PLT 195  < > 189  < > 370  --  292 293  < > = values in this interval not displayed. Cardiac Enzymes:  Recent Labs  11/08/12 1851  05/03/13 2316 05/04/13 0315 05/04/13 1130  CKTOTAL 1920*  --   --   --   --   TROPONINI  --   < > <0.30 <0.30 <0.30  < > = values in this  interval not displayed. BNP: No components found with this basename: POCBNP,  CBG: No results found for this basename: GLUCAP,  in the last 8760 hours TSH:  Recent Labs  05/03/13 1228  TSH 3.864   A1C: No results found for this basename: HGBA1C    Assessment/Plan 1. Acute Renal  Failure -improved in hospital; will follow up BMP   2. Atrial fibrillation -rate controlled; conts on eliquis -conts on amio; will follow TSH  3. Unspecified constipation -stable at this time  4. Closed supracondylar fracture of left elbow with routine healing -conts with split; pain well managed  5. Dementia without behavioral disturbance - acute encephalopathy has improved; pt with dementia who does well in current environment however pt does not follow recommendation and is hard to redirect at times; palliative care has been consulted in hospital    6. Palliative care encounter -will re-consult palliative care at [facility]  7. UTI (urinary tract infection) -conts on PO levaquin   8. Hypertension - controlled; will continue monitoring and continue current regimen  9. CHF Appears Well compensated; will cont to monitor

## 2013-05-09 LAB — CULTURE, BLOOD (ROUTINE X 2)
Culture: NO GROWTH
Culture: NO GROWTH

## 2013-05-09 NOTE — Progress Notes (Signed)
Ok per MD [facility].  Notified facility, patient, family and nursing.  No further CSW needs required.  CSW signing off.  Lorri Frederickonna T. West PughCrowder, LCSWA  [phone]

## 2013-05-10 ENCOUNTER — Encounter: Payer: Self-pay | Admitting: Internal Medicine

## 2013-05-10 ENCOUNTER — Non-Acute Institutional Stay (SKILLED_NURSING_FACILITY): Payer: Medicare Other | Admitting: Internal Medicine

## 2013-05-10 DIAGNOSIS — G9340 Encephalopathy, unspecified: Secondary | ICD-10-CM

## 2013-05-10 DIAGNOSIS — E039 Hypothyroidism, unspecified: Secondary | ICD-10-CM

## 2013-05-10 DIAGNOSIS — R1310 Dysphagia, unspecified: Secondary | ICD-10-CM

## 2013-05-10 DIAGNOSIS — T68XXXA Hypothermia, initial encounter: Secondary | ICD-10-CM

## 2013-05-10 DIAGNOSIS — E875 Hyperkalemia: Secondary | ICD-10-CM

## 2013-05-10 DIAGNOSIS — N179 Acute kidney failure, unspecified: Secondary | ICD-10-CM

## 2013-05-10 DIAGNOSIS — S42412D Displaced simple supracondylar fracture without intercondylar fracture of left humerus, subsequent encounter for fracture with routine healing: Secondary | ICD-10-CM

## 2013-05-10 DIAGNOSIS — F0390 Unspecified dementia without behavioral disturbance: Secondary | ICD-10-CM

## 2013-05-10 DIAGNOSIS — S42309D Unspecified fracture of shaft of humerus, unspecified arm, subsequent encounter for fracture with routine healing: Secondary | ICD-10-CM

## 2013-05-10 DIAGNOSIS — I4891 Unspecified atrial fibrillation: Secondary | ICD-10-CM

## 2013-05-10 DIAGNOSIS — I10 Essential (primary) hypertension: Secondary | ICD-10-CM

## 2013-05-10 NOTE — Assessment & Plan Note (Signed)
Resolved with IVF, with d./c [facility] and adjust po K+

## 2013-05-10 NOTE — Progress Notes (Signed)
MRN: [id number] Name: Danielle [name]  Sex: female Age: 80 y.o. DOB: [birth year]  [facility] #: [facility] Facility/Room: 312 Level Of Care: SNF Provider: Merrilee Seashore D Emergency Contacts: Extended Emergency Contact Information Primary Emergency Contact: [name],[name] Address: [address]          [place] [address] [place] Home Phone: [phone] Relation: Daughter  Code Status: DNR ; per pallative care in hospital no more hospital admissions, comfort care if/when pt declines  Allergies: Keflex and Penicillins  Chief Complaint  Patient presents with  . nursing home admission    HPI: Patient is 80 y.o. female who was hospitalized for rising BUN/Cr, K+ of 6.0 and mental status change, all resolved now.  Past Medical History  Diagnosis Date  . Coronary artery disease   . CHF (congestive heart failure)   . Hypertension   . Anemia     acute blood loss from hip surgery 11/2012  . A-fib     Past Surgical History  Procedure Laterality Date  . Femur im nail Left 11/09/2012    Procedure: INTRAMEDULLARY (IM) NAIL FEMORAL/Left;  Surgeon: Toni Arthurs, MD;  Location: [facility] OR;  Service: Orthopedics;  Laterality: Left;      Medication List       This list is accurate as of: 05/10/13  8:17 PM.  Always use your most recent med list.               acetaminophen 325 MG tablet  Commonly known as:  TYLENOL  Take 650 mg by mouth every 6 (six) hours as needed for mild pain or moderate pain.     amiodarone 200 MG tablet  Commonly known as:  PACERONE  Take 1 tablet (200 mg total) by mouth daily.     apixaban 2.5 MG Tabs tablet  Commonly known as:  ELIQUIS  Take 1 tablet (2.5 mg total) by mouth 2 (two) times daily.     collagenase ointment  Commonly known as:  SANTYL  Apply 1 application topically daily.     feeding supplement (ENSURE COMPLETE) Liqd  Take 237 mLs by mouth 2 (two) times daily between meals.     furosemide 20 MG tablet  Commonly known as:   LASIX  Take 20 mg by mouth 2 (two) times daily.     Iron 325 (65 FE) MG Tabs  Take 1 tablet by mouth 2 (two) times daily.     ketorolac 0.5 % ophthalmic solution  Commonly known as:  ACULAR  Place 1 drop into the right eye 4 (four) times daily.     levofloxacin 500 MG tablet  Commonly known as:  LEVAQUIN  Take 1 tablet (500 mg total) by mouth every other day. Give every other day starting on the 30 and give a total of 3 more doses.     metoprolol succinate 25 MG 24 hr tablet  Commonly known as:  TOPROL-XL  Take 25 mg by mouth 2 (two) times daily.     multivitamin with minerals tablet  Take 1 tablet by mouth daily.     nitroGLYCERIN 0.4 MG SL tablet  Commonly known as:  NITROSTAT  Place 0.4 mg under the tongue every 5 (five) minutes as needed for chest pain.     potassium chloride SA 20 MEQ tablet  Commonly known as:  K-DUR,KLOR-CON  Take 1 tablet (20 mEq total) by mouth daily. Crushed and give with apple sauce.     vitamin C 500 MG tablet  Commonly  known as:  ASCORBIC ACID  Take 500 mg by mouth daily.        No orders of the defined types were placed in this encounter.     There is no immunization history on file for this patient.  History  Substance Use Topics  . Smoking status: Never Smoker   . Smokeless tobacco: Never Used  . Alcohol Use: No    Family history is noncontributory    Review of Systems   UTO from pt sec dementia  Filed Vitals:   05/10/13 1029  BP: 128/63  Pulse: 96  Temp: 97.9 F (36.6 C)  Resp: 19    Physical Exam  GENERAL APPEARANCE: Alert, min conversant. Appropriately groomed. No acute distress.  SKIN: No diaphoresis rash HEAD: Normocephalic, atraumatic  EYES: Conjunctiva/lids clear. Pupils round, reactive. EOMs intact.  EARS: External exam WNL, canals clear. Hearing grossly normal.  NOSE: No deformity or discharge.  MOUTH/THROAT: Lips w/o lesions. Mouth and throat normal. Tongue moist, w/o lesion.  NECK: No thyroid  tenderness, enlargement or nodule  RESPIRATORY: Breathing is even, unlabored. Lung sounds are clear   CARDIOVASCULAR: Heart RRR no murmurs, rubs or gallops. No peripheral edema.   GASTROINTESTINAL: Abdomen is soft, non-tender, not distended w/ normal bowel sounds. GENITOURINARY: Bladder non tender, not distended  MUSCULOSKELETAL: L arm in splint NEUROLOGIC:  Cranial nerves 2-12 grossly intact. Moves all extremities no tremor. PSYCHIATRIC: dementia, no behavioral issues  Patient Active Problem List   Diagnosis Date Noted  . Hyperkalemia 05/10/2013  . Dysphagia, unspecified(787.20) 05/10/2013  . Weakness generalized 05/06/2013  . Palliative care encounter 05/04/2013  . Atrial fibrillation 05/04/2013  . Altered mental state 05/03/2013  . Acute renal failure 05/03/2013  . Hypothermia 05/03/2013  . Encephalopathy 05/03/2013  . Closed supracondylar fracture of left elbow with routine healing 04/09/2013  . UTI (urinary tract infection) 04/09/2013  . Dementia without behavioral disturbance 04/09/2013  . Failure to thrive 03/31/2013  . DNR (do not resuscitate) 03/31/2013  . Encounter for long-term (current) use of other medications 01/11/2013  . Acute venous embolism and thrombosis of deep vessels of distal lower extremity 12/30/2012  . Unspecified hypothyroidism 12/23/2012  . Chronic systolic congestive heart failure 12/23/2012  . Acute posthemorrhagic anemia 12/09/2012  . Unspecified constipation 11/23/2012  . Intertrochanteric fracture of left hip 11/16/2012  . Coronary artery disease   . Hypertension   . Anemia     CBC    Component Value Date/Time   WBC 5.6 05/05/2013 0535   RBC 3.16* 05/05/2013 0535   HGB 10.0* 05/05/2013 0535   HCT 30.4* 05/05/2013 0535   PLT 293 05/05/2013 0535   MCV 96.2 05/05/2013 0535   LYMPHSABS 1.9 05/03/2013 0229   MONOABS 0.8 05/03/2013 0229   EOSABS 0.0 05/03/2013 0229   BASOSABS 0.0 05/03/2013 0229    CMP     Component Value Date/Time   NA 142  05/06/2013 0525   K 4.1 05/06/2013 0525   CL 105 05/06/2013 0525   CO2 25 05/06/2013 0525   GLUCOSE 113* 05/06/2013 0525   BUN 38* 05/06/2013 0525   CREATININE 1.31* 05/06/2013 0525   CALCIUM 7.9* 05/06/2013 0525   PROT 7.9 05/03/2013 0229   ALBUMIN 3.6 05/03/2013 0229   AST 84* 05/03/2013 0229   ALT 59* 05/03/2013 0229   ALKPHOS 187* 05/03/2013 0229   BILITOT 1.3* 05/03/2013 0229   GFRNONAA 34* 05/06/2013 0525   GFRAA 39* 05/06/2013 0525    Assessment and Plan  Encephalopathy Pt was  admitted to hospital for MS change , Cr of 2.0 and K that was 6.0 from NH and was 5.3 in ED. Pt was treated with levaquin for possible UTI and IVF and Cr came down to 1.3 on d/c.  Dementia without behavioral disturbance Pt was seen by pallative care while in hospital and made her a DNR  Hypertension Improved over prior; continue toprol and Lasix  Atrial fibrillation Cards was consulted. Started on amiodorone during hosp and changed to eliquis per pharmacy for prophylaxis; pt also on Toprol  Acute renal failure Lisinopril was stopped and diuretics supposed to be held in one part of the d/c summary and resumed in another part and  lasix was included on d/c summary, 20 mg BID; will change lasix 20 mg  to once daily.  Hyperkalemia Resolved with IVF, with d./c ACE and adjust po K+  Hypothermia Felt sec to UTI;TSH was 3.864 on 1/26  Dysphagia, unspecified(787.20) Type 2 diet and started on Ensure  Closed supracondylar fracture of left elbow with routine healing Splint to be continue until ortho follow up  Unspecified hypothyroidism TSH  3.86 in hospital;continue no meds    Margit HanksALEXANDER, Jacci Ruberg D, MD

## 2013-05-10 NOTE — Assessment & Plan Note (Signed)
Lisinopril was stopped and diuretics supposed to be held in one part of the d/c summary and resumed in another part and  lasix was included on d/c summary, 20 mg BID; will change lasix 20 mg  to once daily.

## 2013-05-10 NOTE — Assessment & Plan Note (Signed)
Improved over prior; continue toprol and Lasix

## 2013-05-10 NOTE — Assessment & Plan Note (Signed)
Pt was seen by pallative care while in hospital and made her a DNR

## 2013-05-10 NOTE — Assessment & Plan Note (Signed)
Felt sec to UTI;TSH was 3.864 on 1/26

## 2013-05-10 NOTE — Assessment & Plan Note (Signed)
Type 2 diet and started on Ensure

## 2013-05-10 NOTE — Assessment & Plan Note (Signed)
Cards was consulted. Started on amiodorone during hosp and changed to eliquis per pharmacy for prophylaxis; pt also on Toprol

## 2013-05-10 NOTE — Assessment & Plan Note (Signed)
Pt was admitted to hospital for MS change , Cr of 2.0 and K that was 6.0 from NH and was 5.3 in ED. Pt was treated with levaquin for possible UTI and IVF and Cr came down to 1.3 on d/c.

## 2013-05-10 NOTE — Assessment & Plan Note (Signed)
TSH  3.86 in hospital;continue no meds

## 2013-05-10 NOTE — Assessment & Plan Note (Signed)
Splint to be continue until ortho follow up

## 2013-05-17 ENCOUNTER — Encounter: Payer: Self-pay | Admitting: Internal Medicine

## 2013-05-17 ENCOUNTER — Non-Acute Institutional Stay (SKILLED_NURSING_FACILITY): Payer: Medicare Other | Admitting: Internal Medicine

## 2013-05-17 DIAGNOSIS — I5022 Chronic systolic (congestive) heart failure: Secondary | ICD-10-CM

## 2013-05-17 DIAGNOSIS — I10 Essential (primary) hypertension: Secondary | ICD-10-CM

## 2013-05-17 DIAGNOSIS — I509 Heart failure, unspecified: Secondary | ICD-10-CM

## 2013-05-17 DIAGNOSIS — F03918 Unspecified dementia, unspecified severity, with other behavioral disturbance: Secondary | ICD-10-CM

## 2013-05-17 DIAGNOSIS — F0391 Unspecified dementia with behavioral disturbance: Secondary | ICD-10-CM

## 2013-05-17 NOTE — Assessment & Plan Note (Signed)
Start Norvasc 5 mg daily;staying away from [facility] because of prior ARF and hyperkalemia

## 2013-05-17 NOTE — Progress Notes (Signed)
MRN: [id number] Name: Danielle [name]  Sex: female Age: 80 y.o. DOB: 06/27/1916  [facility] Facility[facility]: 312 Level Of Care: SNF Provider: Merrilee SeashoreALEXANDER, Tamber Burtch D Emergency Contacts: Extended Emergency Contact Information Primary Emergency Contact: [name],[name] Address: [address]          [place] [id number] [place] Home Phone: [phone] Relation: Daughter  Code Status: DNR  Allergies: Keflex and Penicillins  Chief Complaint  Patient presents with  . Acute Visit    HPI: Patient is 80 y.o. female who has been having behavoirs, screaming help me help me, reported hallucinations and not wanting to be alone.  Past Medical History  Diagnosis Date  . Coronary artery disease   . CHF (congestive heart failure)   . Hypertension   . Anemia     acute blood loss from hip surgery 11/2012  . A-fib     Past Surgical History  Procedure Laterality Date  . Femur im nail Left 11/09/2012    Procedure: INTRAMEDULLARY (IM) NAIL FEMORAL/Left;  Surgeon: Toni ArthursJohn Hewitt, MD;  Location: [facility] OR;  Service: Orthopedics;  Laterality: Left;      Medication List       This list is accurate as of: 05/17/13 11:57 PM.  Always use your most recent med list.               acetaminophen 325 MG tablet  Commonly known as:  TYLENOL  Take 650 mg by mouth every 6 (six) hours as needed for mild pain or moderate pain.     amiodarone 200 MG tablet  Commonly known as:  PACERONE  Take 1 tablet (200 mg total) by mouth daily.     apixaban 2.5 MG Tabs tablet  Commonly known as:  ELIQUIS  Take 1 tablet (2.5 mg total) by mouth 2 (two) times daily.     collagenase ointment  Commonly known as:  SANTYL  Apply 1 application topically daily.     feeding supplement (ENSURE COMPLETE) Liqd  Take 237 mLs by mouth 2 (two) times daily between meals.     furosemide 20 MG tablet  Commonly known as:  LASIX  Take 20 mg by mouth 2 (two) times daily.     Iron 325 (65 FE) MG Tabs  Take 1  tablet by mouth 2 (two) times daily.     ketorolac 0.5 % ophthalmic solution  Commonly known as:  ACULAR  Place 1 drop into the right eye 4 (four) times daily.     levofloxacin 500 MG tablet  Commonly known as:  LEVAQUIN  Take 1 tablet (500 mg total) by mouth every other day. Give every other day starting on the 30 and give a total of 3 more doses.     metoprolol succinate 25 MG 24 hr tablet  Commonly known as:  TOPROL-XL  Take 25 mg by mouth 2 (two) times daily.     multivitamin with minerals tablet  Take 1 tablet by mouth daily.     nitroGLYCERIN 0.4 MG SL tablet  Commonly known as:  NITROSTAT  Place 0.4 mg under the tongue every 5 (five) minutes as needed for chest pain.     potassium chloride SA 20 MEQ tablet  Commonly known as:  K-DUR,KLOR-CON  Take 1 tablet (20 mEq total) by mouth daily. Crushed and give with apple sauce.     vitamin C 500 MG tablet  Commonly known as:  ASCORBIC ACID  Take 500 mg by mouth daily.  No orders of the defined types were placed in this encounter.     There is no immunization history on file for this patient.  History  Substance Use Topics  . Smoking status: Never Smoker   . Smokeless tobacco: Never Used  . Alcohol Use: No    Review of Systems    UTO sec to dementia; nurses report as per HPI   Filed Vitals:   05/17/13 1245  BP: 160/82  Pulse: 97  Temp: 96.8 F (36 C)  Resp: 20    Physical Exam  GENERAL APPEARANCE:  nonconversant. Appropriately groomed. No acute distress  SKIN: No diaphoresis rash, poor turgor HEENT: Unremarkable RESPIRATORY: Breathing is even, unlabored. Lung sounds are clear   CARDIOVASCULAR: Heart irreg no murmurs, rubs or gallops. No peripheral edema  GASTROINTESTINAL: Abdomen is soft, non-tender, not distended w/ normal bowel sounds.  GENITOURINARY: Bladder non tender, not distended  MUSCULOSKELETAL: No abnormal joints or musculature NEUROLOGIC: Cranial nerves 2-12 grossly intact. Moves all  extremities no tremor.   Patient Active Problem List   Diagnosis Date Noted  . Hyperkalemia 05/10/2013  . Dysphagia, unspecified(787.20) 05/10/2013  . Weakness generalized 05/06/2013  . Palliative care encounter 05/04/2013  . Atrial fibrillation 05/04/2013  . Altered mental state 05/03/2013  . Acute renal failure 05/03/2013  . Hypothermia 05/03/2013  . Encephalopathy 05/03/2013  . Closed supracondylar fracture of left elbow with routine healing 04/09/2013  . UTI (urinary tract infection) 04/09/2013  . Dementia with behavioral disturbance 04/09/2013  . Failure to thrive 03/31/2013  . DNR (do not resuscitate) 03/31/2013  . Encounter for long-term (current) use of other medications 01/11/2013  . Acute venous embolism and thrombosis of deep vessels of distal lower extremity 12/30/2012  . Unspecified hypothyroidism 12/23/2012  . Chronic systolic congestive heart failure 12/23/2012  . Acute posthemorrhagic anemia 12/09/2012  . Unspecified constipation 11/23/2012  . Intertrochanteric fracture of left hip 11/16/2012  . Coronary artery disease   . Hypertension   . Anemia     CBC    Component Value Date/Time   WBC 5.6 05/05/2013 0535   RBC 3.16* 05/05/2013 0535   HGB 10.0* 05/05/2013 0535   HCT 30.4* 05/05/2013 0535   PLT 293 05/05/2013 0535   MCV 96.2 05/05/2013 0535   LYMPHSABS 1.9 05/03/2013 0229   MONOABS 0.8 05/03/2013 0229   EOSABS 0.0 05/03/2013 0229   BASOSABS 0.0 05/03/2013 0229    CMP     Component Value Date/Time   NA 142 05/06/2013 0525   K 4.1 05/06/2013 0525   CL 105 05/06/2013 0525   CO2 25 05/06/2013 0525   GLUCOSE 113* 05/06/2013 0525   BUN 38* 05/06/2013 0525   CREATININE 1.31* 05/06/2013 0525   CALCIUM 7.9* 05/06/2013 0525   PROT 7.9 05/03/2013 0229   ALBUMIN 3.6 05/03/2013 0229   AST 84* 05/03/2013 0229   ALT 59* 05/03/2013 0229   ALKPHOS 187* 05/03/2013 0229   BILITOT 1.3* 05/03/2013 0229   GFRNONAA 34* 05/06/2013 0525   GFRAA 39* 05/06/2013 0525    Assessment and  Plan  Dementia with behavioral disturbance Concern that pt is having behavoirs since being put on Norco 3 days ago. Nursing staff say she has been this way since admit. Concern that behavoirs are secondary to pain that pt can't vocalize. Concern that the Ativan 0.5 is too strong. Concern that behavoirs may be secondary to UTI or metabolic problem. SO........taking all into consideration will d/c norco and start ultram 50 mg q 6 for pain.  Ativan 0.25 mg BID, prn and 0.5 mg qHS prn. Treat for pain first then go to ativan.  Will check U/A, CBC and BMP.  Hypertension Start Norvasc 5 mg daily;staying away from [facility] because of prior ARF and hyperkalemia   Chronic systolic congestive heart failure Pt noted to have poor skin turgor;too dry?  Will check BMP    Margit Hanks, MD

## 2013-05-17 NOTE — Assessment & Plan Note (Signed)
Pt noted to have poor skin turgor;too dry?  Will check BMP

## 2013-05-17 NOTE — Assessment & Plan Note (Addendum)
Concern that pt is having behavoirs since being put on Norco 3 days ago. Nursing staff say she has been this way since admit. Concern that behavoirs are secondary to pain that pt can't vocalize. Concern that the Ativan 0.5 is too strong. Concern that behavoirs may be secondary to UTI or metabolic problem. SO........taking all into consideration will d/c norco and start ultram 50 mg q 6 for pain. Ativan 0.25 mg BID, prn and 0.5 mg qHS prn. Treat for pain first then go to ativan.  Will check U/A, CBC and BMP.

## 2013-05-31 ENCOUNTER — Encounter: Payer: Self-pay | Admitting: Internal Medicine

## 2013-05-31 ENCOUNTER — Non-Acute Institutional Stay (SKILLED_NURSING_FACILITY): Payer: Medicare Other | Admitting: Internal Medicine

## 2013-05-31 DIAGNOSIS — IMO0002 Reserved for concepts with insufficient information to code with codable children: Secondary | ICD-10-CM

## 2013-05-31 DIAGNOSIS — I10 Essential (primary) hypertension: Secondary | ICD-10-CM

## 2013-05-31 DIAGNOSIS — F03918 Unspecified dementia, unspecified severity, with other behavioral disturbance: Secondary | ICD-10-CM

## 2013-05-31 DIAGNOSIS — L8995 Pressure ulcer of unspecified site, unstageable: Secondary | ICD-10-CM

## 2013-05-31 DIAGNOSIS — L89890 Pressure ulcer of other site, unstageable: Secondary | ICD-10-CM

## 2013-05-31 DIAGNOSIS — G4700 Insomnia, unspecified: Secondary | ICD-10-CM

## 2013-05-31 DIAGNOSIS — F0391 Unspecified dementia with behavioral disturbance: Secondary | ICD-10-CM

## 2013-05-31 DIAGNOSIS — L89899 Pressure ulcer of other site, unspecified stage: Secondary | ICD-10-CM

## 2013-05-31 NOTE — Assessment & Plan Note (Signed)
Pt was never started on norvasc but with pt's LE edema that's probably a good thing;start Hydralazine 10 mg TID; BP yesterday was 189/113

## 2013-05-31 NOTE — Assessment & Plan Note (Addendum)
Nothing specific, pt is just not advancing;entire chart reviewed;pt's weight has basically been between 160-166 with a few unbelievable low readings; being on extralasix has not really changed pt's weight although eema is better. I'm really more concerned about her kidneys ;will repeat BMP and cbc in a week

## 2013-05-31 NOTE — Assessment & Plan Note (Signed)
Both feet; wound care nurse is seeing;no infection; soft boots

## 2013-05-31 NOTE — Assessment & Plan Note (Signed)
Nurses have asked for something at night. Lets try simple with melatonin 6 mg po qHs.

## 2013-05-31 NOTE — Assessment & Plan Note (Signed)
Didn't tolerate benzo so depakote started by psych and lexapro. An antipsychotic is tempting but wasn't recomended.

## 2013-05-31 NOTE — Progress Notes (Addendum)
MRN: [id number] Name: Danielle [name]  Sex: female Age: 80 y.o. DOB: May 15, 1916  PSC #: Sonny Dandy  Facility/Room:  312 Level Of Care: SNF Provider: Merrilee Seashore D Emergency Contacts: Extended Emergency Contact Information Primary Emergency Contact: [name],[name] Address: [address]           [address] [place] Home Phone: [phone] Relation: Daughter  Code Status: DNR  Allergies: Keflex and Penicillins  Chief Complaint  Patient presents with  . Medical Managment of Chronic Issues    HPI: Patient is 80 y.o. female who has several ongoing issues. She has also not really improved much since being admitted to SNF.  Past Medical History  Diagnosis Date  . Coronary artery disease   . CHF (congestive heart failure)   . Hypertension   . Anemia     acute blood loss from hip surgery 11/2012  . A-fib     Past Surgical History  Procedure Laterality Date  . Femur im nail Left 11/09/2012    Procedure: INTRAMEDULLARY (IM) NAIL FEMORAL/Left;  Surgeon: Toni Arthurs, MD;  Location: [facility] OR;  Service: Orthopedics;  Laterality: Left;      Medication List       This list is accurate as of: 05/31/13 11:48 PM.  Always use your most recent med list.               acetaminophen 325 MG tablet  Commonly known as:  TYLENOL  Take 650 mg by mouth every 6 (six) hours as needed for mild pain or moderate pain.     amiodarone 200 MG tablet  Commonly known as:  PACERONE  Take 1 tablet (200 mg total) by mouth daily.     apixaban 2.5 MG Tabs tablet  Commonly known as:  ELIQUIS  Take 1 tablet (2.5 mg total) by mouth 2 (two) times daily.     collagenase ointment  Commonly known as:  SANTYL  Apply 1 application topically daily.     feeding supplement (ENSURE COMPLETE) Liqd  Take 237 mLs by mouth 2 (two) times daily between meals.     furosemide 20 MG tablet  Commonly known as:  LASIX  Take 20 mg by mouth 2 (two) times daily.     Iron 325 (65 FE) MG  Tabs  Take 1 tablet by mouth 2 (two) times daily.     ketorolac 0.5 % ophthalmic solution  Commonly known as:  ACULAR  Place 1 drop into the right eye 4 (four) times daily.     levofloxacin 500 MG tablet  Commonly known as:  LEVAQUIN  Take 1 tablet (500 mg total) by mouth every other day. Give every other day starting on the 30 and give a total of 3 more doses.     metoprolol succinate 25 MG 24 hr tablet  Commonly known as:  TOPROL-XL  Take 25 mg by mouth 2 (two) times daily.     multivitamin with minerals tablet  Take 1 tablet by mouth daily.     nitroGLYCERIN 0.4 MG SL tablet  Commonly known as:  NITROSTAT  Place 0.4 mg under the tongue every 5 (five) minutes as needed for chest pain.     potassium chloride SA 20 MEQ tablet  Commonly known as:  K-DUR,KLOR-CON  Take 1 tablet (20 mEq total) by mouth daily. Crushed and give with apple sauce.     vitamin C 500 MG tablet  Commonly known as:  ASCORBIC ACID  Take 500 mg by mouth  daily.        No orders of the defined types were placed in this encounter.     There is no immunization history on file for this patient.  History  Substance Use Topics  . Smoking status: Never Smoker   . Smokeless tobacco: Never Used  . Alcohol Use: No    Review of Systems  DATA OBTAINED: from patient, nurse GENERAL:  no fevers, fatigue, appetite changes SKIN: No itching, rash HEENT: No complaint RESPIRATORY: No cough, wheezing, SOB CARDIAC: No chest pain, palpitations, + extremity edema  GI: No abdominal pain, No N/V/D or constipation, No heartburn or reflux  GU: No dysuria, frequency or urgency, or incontinence  MUSCULOSKELETAL: No unrelieved bone/joint pain NEUROLOGIC: No headache, dizziness or focal weakness PSYCHIATRIC: calm one minute, screaming the next  Filed Vitals:   05/31/13 2303  BP: 157/68  Pulse: 86  Temp: 97.6 F (36.4 C)  Resp: 19    Physical Exam  GENERAL APPEARANCE: Alert, nonconversant. Appropriately  groomed. No acute distress  SKIN: No diaphoresis rash; unstageable decubitus each heel, dressed HEENT: Unremarkable RESPIRATORY: Breathing is even, unlabored. Lung sounds are clear   CARDIOVASCULAR: Heart RRR no murmurs, rubs or gallops. 1/2+ peripheral edema ; slt less than prior GASTROINTESTINAL: Abdomen is soft, non-tender, not distended w/ normal bowel sounds.  GENITOURINARY: Bladder non tender, not distended  MUSCULOSKELETAL: No abnormal joints or musculature NEUROLOGIC: Cranial nerves 2-12 grossly intact. Moves all extremities no tremor. PSYCHIATRIC: calm now  Patient Active Problem List   Diagnosis Date Noted  . Decubitus ulcer of foot, unstageable 05/31/2013  . Insomnia 05/31/2013  . Hyperkalemia 05/10/2013  . Dysphagia, unspecified(787.20) 05/10/2013  . Weakness generalized 05/06/2013  . Palliative care encounter 05/04/2013  . Atrial fibrillation 05/04/2013  . Altered mental state 05/03/2013  . Acute renal failure 05/03/2013  . Hypothermia 05/03/2013  . Encephalopathy 05/03/2013  . Closed supracondylar fracture of left elbow with routine healing 04/09/2013  . UTI (urinary tract infection) 04/09/2013  . Dementia with behavioral disturbance 04/09/2013  . Failure to thrive 03/31/2013  . DNR (do not resuscitate) 03/31/2013  . Encounter for long-term (current) use of other medications 01/11/2013  . Acute venous embolism and thrombosis of deep vessels of distal lower extremity 12/30/2012  . Unspecified hypothyroidism 12/23/2012  . Chronic systolic congestive heart failure 12/23/2012  . Acute posthemorrhagic anemia 12/09/2012  . Unspecified constipation 11/23/2012  . Intertrochanteric fracture of left hip 11/16/2012  . Coronary artery disease   . Hypertension   . Anemia     CBC    Component Value Date/Time   WBC 5.6 05/05/2013 0535   RBC 3.16* 05/05/2013 0535   HGB 10.0* 05/05/2013 0535   HCT 30.4* 05/05/2013 0535   PLT 293 05/05/2013 0535   MCV 96.2 05/05/2013 0535    LYMPHSABS 1.9 05/03/2013 0229   MONOABS 0.8 05/03/2013 0229   EOSABS 0.0 05/03/2013 0229   BASOSABS 0.0 05/03/2013 0229    CMP     Component Value Date/Time   NA 142 05/06/2013 0525   K 4.1 05/06/2013 0525   CL 105 05/06/2013 0525   CO2 25 05/06/2013 0525   GLUCOSE 113* 05/06/2013 0525   BUN 38* 05/06/2013 0525   CREATININE 1.31* 05/06/2013 0525   CALCIUM 7.9* 05/06/2013 0525   PROT 7.9 05/03/2013 0229   ALBUMIN 3.6 05/03/2013 0229   AST 84* 05/03/2013 0229   ALT 59* 05/03/2013 0229   ALKPHOS 187* 05/03/2013 0229   BILITOT 1.3* 05/03/2013 0229  GFRNONAA 34* 05/06/2013 0525   GFRAA 39* 05/06/2013 0525    Assessment and Plan  Hypertension Pt was never started on norvasc but with pt's LE edema that's probably a good thing;start Hydralazine 10 mg TID; BP yesterday was 189/113  Failure to thrive Nothing specific, pt is just not advancing;entire chart reviewed;pt's weight has basically been between 160-166 with a few unbelievable low readings; being on extralasix has not really changed pt's weight although eema is better. I'm really more concerned about her kidneys ;will repeat BMP and cbc in a week  Decubitus ulcer of foot, unstageable Both feet; wound care nurse is seeing;no infection; soft boots  Insomnia Nurses have asked for something at night. Lets try simple with melatonin 6 mg po qHs.  Dementia with behavioral disturbance Didn't tolerate benzo so depakote started by psych and lexapro. An antipsychotic is tempting but wasn't recomended.    Margit HanksALEXANDER, Aydien Majette D, MD

## 2013-06-14 ENCOUNTER — Other Ambulatory Visit: Payer: Self-pay | Admitting: *Deleted

## 2013-06-14 MED ORDER — MORPHINE SULFATE (CONCENTRATE) 20 MG/ML PO SOLN: ORAL | Status: DC

## 2013-06-14 NOTE — Telephone Encounter (Signed)
[facility] 

## 2013-06-18 ENCOUNTER — Non-Acute Institutional Stay (SKILLED_NURSING_FACILITY): Payer: Medicare Other | Admitting: Nurse Practitioner

## 2013-06-18 DIAGNOSIS — D649 Anemia, unspecified: Secondary | ICD-10-CM

## 2013-06-18 DIAGNOSIS — L89899 Pressure ulcer of other site, unspecified stage: Secondary | ICD-10-CM

## 2013-06-18 DIAGNOSIS — S82101D Unspecified fracture of upper end of right tibia, subsequent encounter for closed fracture with routine healing: Secondary | ICD-10-CM | POA: Insufficient documentation

## 2013-06-18 DIAGNOSIS — I10 Essential (primary) hypertension: Secondary | ICD-10-CM

## 2013-06-18 DIAGNOSIS — I5022 Chronic systolic (congestive) heart failure: Secondary | ICD-10-CM

## 2013-06-18 DIAGNOSIS — I4891 Unspecified atrial fibrillation: Secondary | ICD-10-CM

## 2013-06-18 DIAGNOSIS — F0391 Unspecified dementia with behavioral disturbance: Secondary | ICD-10-CM

## 2013-06-18 DIAGNOSIS — S8290XD Unspecified fracture of unspecified lower leg, subsequent encounter for closed fracture with routine healing: Secondary | ICD-10-CM

## 2013-06-18 DIAGNOSIS — L89890 Pressure ulcer of other site, unstageable: Secondary | ICD-10-CM

## 2013-06-18 DIAGNOSIS — L8995 Pressure ulcer of unspecified site, unstageable: Secondary | ICD-10-CM

## 2013-06-18 DIAGNOSIS — F03918 Unspecified dementia, unspecified severity, with other behavioral disturbance: Secondary | ICD-10-CM

## 2013-06-18 DIAGNOSIS — I509 Heart failure, unspecified: Secondary | ICD-10-CM

## 2013-06-18 NOTE — Progress Notes (Signed)
Patient ID: Danielle [name], female   DOB: 07/21/1916, 80 y.o.   MRN: [id number]    Nursing Home Location:  [facility]   Place of Service: SNF (31)  PCP: Pola CornSPRUILL,JEROME O, MD  Allergies  Allergen Reactions  . Keflex [Cephalexin] Itching  . Penicillins Itching    Chief Complaint  Patient presents with  . Medical Managment of Chronic Issues    HPI:  80 y.o. female with PMH history of congestive heart failure, hypertension and anemia, left forearm fracture who was [facility] hospital stay for acute encephalopathy with UTI and ARF. Pt was followed by [facility] for a few weeks and [facility]; pt with new tibial fracture with routine healing requiring immobilizer, pt with ongoing dementia and hard to control behaviors but recently appears to be doing better with behaviors worse at night;  Pt started pocketing food and was hard to take POs, staff still able to give medications in applesauce, pts primarily goal is comfort at this time, alliative care was consulted in the hospital and  The plan is to cont to follow [facility]. No acute concerns per nursing at this time   Review of Systems:  Unable to obtain ROS  Past Medical History  Diagnosis Date  . Coronary artery disease   . CHF (congestive heart failure)   . Hypertension   . Anemia     acute blood loss from hip surgery 11/2012  . A-fib    Past Surgical History  Procedure Laterality Date  . Femur im nail Left 11/09/2012    Procedure: INTRAMEDULLARY (IM) NAIL FEMORAL/Left;  Surgeon: Toni ArthursJohn Hewitt, MD;  Location: [facility] OR;  Service: Orthopedics;  Laterality: Left;   Social History:   reports that she has never smoked. She has never used smokeless tobacco. She reports that she does not drink alcohol. Her drug history is not on file.  No family history on file.  Medications: Patient's Medications  New Prescriptions   No medications on file  Previous Medications   ACETAMINOPHEN  (TYLENOL) 325 MG TABLET    Take 650 mg by mouth every 6 (six) hours as needed for mild pain or moderate pain.   AMIODARONE (PACERONE) 200 MG TABLET    Take 1 tablet (200 mg total) by mouth daily.   APIXABAN (ELIQUIS) 2.5 MG TABS TABLET    Take 1 tablet (2.5 mg total) by mouth 2 (two) times daily.   COLLAGENASE (SANTYL) OINTMENT    Apply 1 application topically daily.   DIVALPROEX (DEPAKOTE SPRINKLE) 125 MG CAPSULE    Take 250 mg by mouth 2 (two) times daily.   ESCITALOPRAM (LEXAPRO) 10 MG TABLET    Take 10 mg by mouth daily.   FEEDING SUPPLEMENT, ENSURE COMPLETE, (ENSURE COMPLETE) LIQD    Take 237 mLs by mouth 2 (two) times daily between meals.   FERROUS SULFATE (IRON) 325 (65 FE) MG TABS    Take 1 tablet by mouth 2 (two) times daily.   FUROSEMIDE (LASIX) 20 MG TABLET    Take 20 mg by mouth 2 (two) times daily.    HYDRALAZINE (APRESOLINE) 10 MG TABLET    Take 10 mg by mouth 3 (three) times daily.   KETOROLAC (ACULAR) 0.5 % OPHTHALMIC SOLUTION    Place 1 drop into the right eye 4 (four) times daily.   MELATONIN PO    Take 6 mg by mouth at bedtime as needed.   METOPROLOL SUCCINATE (TOPROL-XL) 25  MG 24 HR TABLET    Take 25 mg by mouth 2 (two) times daily.   MORPHINE (ROXANOL) 20 MG/ML CONCENTRATED SOLUTION    Give 0.57ml by mouth every 6 hours as needed for pain   MULTIPLE VITAMINS-MINERALS (MULTIVITAMIN WITH MINERALS) TABLET    Take 1 tablet by mouth daily.   NITROGLYCERIN (NITROSTAT) 0.4 MG SL TABLET    Place 0.4 mg under the tongue every 5 (five) minutes as needed for chest pain.   POTASSIUM CHLORIDE SA (K-DUR,KLOR-CON) 20 MEQ TABLET    Take 1 tablet (20 mEq total) by mouth daily. Crushed and give with apple sauce.   TRAMADOL (ULTRAM) 50 MG TABLET    Take 50 mg by mouth every 12 (twelve) hours.   VITAMIN C (ASCORBIC ACID) 500 MG TABLET    Take 500 mg by mouth daily.  Modified Medications   No medications on file  Discontinued Medications   LEVOFLOXACIN (LEVAQUIN) 500 MG TABLET    Take 1 tablet  (500 mg total) by mouth every other day. Give every other day starting on the 30 and give a total of 3 more doses.     Physical Exam:  Filed Vitals:   06/18/13 1306  BP: 147/87  Pulse: 87  Temp: 96 F (35.6 C)  Resp: 20   Physical Exam  Constitutional: She is well-developed, well-nourished, and in no distress.  HENT:  Head: Normocephalic and atraumatic.  Nose: Nose normal.  Mouth/Throat: Oropharynx is clear and moist. No oropharyngeal exudate.  Eyes: Conjunctivae and EOM are normal. Pupils are equal, round, and reactive to light.  Neck: Normal range of motion. Neck supple. No JVD present. No thyromegaly present.  Cardiovascular: Normal rate, regular rhythm and normal heart sounds.   Pulmonary/Chest: Effort normal and breath sounds normal. No respiratory distress.  Abdominal: Soft. Bowel sounds are normal. She exhibits no distension. There is no tenderness.  Musculoskeletal: She exhibits edema (2+).  Neurological: She is alert.  Skin: Skin is warm and dry.  Heel wounds; followed by treatment nurse  Psychiatric: She has a flat affect.      Labs reviewed: Basic Metabolic Panel:  Recent Labs  96/04/54 0315 05/05/13 0535 05/06/13 0525  NA 148* 148* 142  K 4.6 3.9 4.1  CL 110 111 105  CO2 24 24 25   GLUCOSE 67* 138* 113*  BUN 56* 48* 38*  CREATININE 1.72* 1.53* 1.31*  CALCIUM 8.4 8.0* 7.9*   Liver Function Tests:  Recent Labs  11/08/12 1851 03/31/13 1648 05/03/13 0229  AST 58* 52* 84*  ALT 16 23 59*  ALKPHOS 85 90 187*  BILITOT 0.7 0.9 1.3*  PROT 7.7 6.3 7.9  ALBUMIN 3.5 2.9* 3.6    Recent Labs  03/31/13 1648  LIPASE 18    Recent Labs  05/03/13 1228  AMMONIA 49   CBC:  Recent Labs  11/08/12 1851  03/31/13 1648  05/03/13 0229 05/03/13 0238 05/04/13 0315 05/05/13 0535  WBC 12.4*  < > 9.3  < > 6.2  --  4.7 5.6  NEUTROABS 9.9*  --  7.0  --  3.5  --   --   --   HGB 11.0*  < > 11.3*  < > 11.1* 12.9 9.4* 10.0*  HCT 31.0*  < > 33.7*  < >  33.4* 38.0 28.7* 30.4*  MCV 88.6  < > 91.8  < > 95.4  --  96.0 96.2  PLT 195  < > 189  < > 370  --  292  293  < > = values in this interval not displayed. Cardiac Enzymes:  Recent Labs  11/08/12 1851  05/03/13 2316 05/04/13 0315 05/04/13 1130  CKTOTAL 1920*  --   --   --   --   TROPONINI  --   < > <0.30 <0.30 <0.30  < > = values in this interval not displayed. BNP: No components found with this basename: POCBNP,  CBG: No results found for this basename: GLUCAP,  in the last 8760 hours TSH:  Recent Labs  05/03/13 1228  TSH 3.864    Assessment/Plan 1. Atrial fibrillation -rate controlled; conts on eliquis ,amiodarone, metoprolol    2. Hypertension -Patients hypertension is stable; continue current regimen. Will monitor and make changes as necessary.  3. Dementia with behavioral disturbance -ongoing dementia with behaviors which are worse at night, now with Depakote BID, melatonin and lexapro, staff reports overall behaviors have improved   4. Closed fracture of proximal end of right tibia with routine healing -followed by ortho who recommended immobilize; has follow up schedule  -pt has ongoing issues with pain. Constantly complains and moans of pain but then is sedated with any pain medication; reports of family wanting comfort only, pt did have palliative care consult but do not see where they have seen her at [facility]; will re-consult at this time  5. Decubitus ulcer of foot, unstageable -conts with heel wounds, followed by treatment nurse   6. Chronic systolic congestive heart failure -swelling has been stable per nursing, recently treated with increase in lasix, conts on lasix and potassium daily   7. Anemia -chronic; hgb on 2.25 was 11.4 which is stable; conts on iron and vit C

## 2013-06-23 ENCOUNTER — Non-Acute Institutional Stay (SKILLED_NURSING_FACILITY): Payer: Medicare Other | Admitting: Internal Medicine

## 2013-06-23 ENCOUNTER — Encounter: Payer: Self-pay | Admitting: Internal Medicine

## 2013-06-23 DIAGNOSIS — S82101D Unspecified fracture of upper end of right tibia, subsequent encounter for closed fracture with routine healing: Secondary | ICD-10-CM

## 2013-06-23 DIAGNOSIS — S8290XD Unspecified fracture of unspecified lower leg, subsequent encounter for closed fracture with routine healing: Secondary | ICD-10-CM

## 2013-06-23 DIAGNOSIS — I4891 Unspecified atrial fibrillation: Secondary | ICD-10-CM

## 2013-06-23 DIAGNOSIS — E039 Hypothyroidism, unspecified: Secondary | ICD-10-CM

## 2013-06-23 DIAGNOSIS — F03918 Unspecified dementia, unspecified severity, with other behavioral disturbance: Secondary | ICD-10-CM

## 2013-06-23 DIAGNOSIS — L89890 Pressure ulcer of other site, unstageable: Secondary | ICD-10-CM

## 2013-06-23 DIAGNOSIS — R1310 Dysphagia, unspecified: Secondary | ICD-10-CM

## 2013-06-23 DIAGNOSIS — L8995 Pressure ulcer of unspecified site, unstageable: Secondary | ICD-10-CM

## 2013-06-23 DIAGNOSIS — F0391 Unspecified dementia with behavioral disturbance: Secondary | ICD-10-CM

## 2013-06-23 DIAGNOSIS — I10 Essential (primary) hypertension: Secondary | ICD-10-CM

## 2013-06-23 DIAGNOSIS — L89899 Pressure ulcer of other site, unspecified stage: Secondary | ICD-10-CM

## 2013-06-23 NOTE — Assessment & Plan Note (Signed)
Healing with immobilizer in place

## 2013-06-23 NOTE — Progress Notes (Signed)
MRN: [id number] Name: Danielle [name]  Sex: female Age: 80 y.o. DOB: 25-Aug-1916  PSC #: Sonny Dandy Facility/Room: 312 Level Of Care: SNF Provider: Merrilee Seashore D Emergency Contacts: Extended Emergency Contact Information Primary Emergency Contact: [name],[name] Address: [address]          [place] [address] [place] Home Phone: [phone] Relation: Daughter  Code Status: DNR  Allergies: Keflex and Penicillins  Chief Complaint  Patient presents with  . Medical Managment of Chronic Issues    HPI: Patient is 80 y.o. female who is being seen for routine medical problems.  Past Medical History  Diagnosis Date  . Coronary artery disease   . CHF (congestive heart failure)   . Hypertension   . Anemia     acute blood loss from hip surgery 11/2012  . A-fib     Past Surgical History  Procedure Laterality Date  . Femur im nail Left 11/09/2012    Procedure: INTRAMEDULLARY (IM) NAIL FEMORAL/Left;  Surgeon: Toni Arthurs, MD;  Location: [facility] OR;  Service: Orthopedics;  Laterality: Left;      Medication List       This list is accurate as of: 06/23/13  1:47 PM.  Always use your most recent med list.               acetaminophen 325 MG tablet  Commonly known as:  TYLENOL  Take 650 mg by mouth every 6 (six) hours as needed for mild pain or moderate pain.     amiodarone 200 MG tablet  Commonly known as:  PACERONE  Take 1 tablet (200 mg total) by mouth daily.     apixaban 2.5 MG Tabs tablet  Commonly known as:  ELIQUIS  Take 1 tablet (2.5 mg total) by mouth 2 (two) times daily.     collagenase ointment  Commonly known as:  SANTYL  Apply 1 application topically daily.     divalproex 125 MG capsule  Commonly known as:  DEPAKOTE SPRINKLE  Take 250 mg by mouth 2 (two) times daily.     escitalopram 10 MG tablet  Commonly known as:  LEXAPRO  Take 10 mg by mouth daily.     feeding supplement (ENSURE COMPLETE) Liqd  Take 237 mLs by mouth 2 (two)  times daily between meals.     furosemide 20 MG tablet  Commonly known as:  LASIX  Take 20 mg by mouth 2 (two) times daily.     hydrALAZINE 10 MG tablet  Commonly known as:  APRESOLINE  Take 10 mg by mouth 3 (three) times daily.     Iron 325 (65 FE) MG Tabs  Take 1 tablet by mouth 2 (two) times daily.     ketorolac 0.5 % ophthalmic solution  Commonly known as:  ACULAR  Place 1 drop into the right eye 4 (four) times daily.     MELATONIN PO  Take 6 mg by mouth at bedtime as needed.     metoprolol succinate 25 MG 24 hr tablet  Commonly known as:  TOPROL-XL  Take 25 mg by mouth 2 (two) times daily.     morphine 20 MG/ML concentrated solution  Commonly known as:  ROXANOL  Give 0.68ml by mouth every 6 hours as needed for pain     multivitamin with minerals tablet  Take 1 tablet by mouth daily.     nitroGLYCERIN 0.4 MG SL tablet  Commonly known as:  NITROSTAT  Place 0.4 mg under the tongue every 5 (  five) minutes as needed for chest pain.     potassium chloride SA 20 MEQ tablet  Commonly known as:  K-DUR,KLOR-CON  Take 1 tablet (20 mEq total) by mouth daily. Crushed and give with apple sauce.     traMADol 50 MG tablet  Commonly known as:  ULTRAM  Take 50 mg by mouth every 12 (twelve) hours.     vitamin C 500 MG tablet  Commonly known as:  ASCORBIC ACID  Take 500 mg by mouth daily.        No orders of the defined types were placed in this encounter.     There is no immunization history on file for this patient.  History  Substance Use Topics  . Smoking status: Never Smoker   . Smokeless tobacco: Never Used  . Alcohol Use: No    Review of Systems  DATA OBTAINED: from patient;dementia , min conv, no c/o    Filed Vitals:   06/23/13 1336  BP: 163/106  Pulse: 63  Temp: 98.4 F (36.9 C)  Resp: 20    Physical Exam  GENERAL APPEARANCE: Alert, min conversant. Appropriately groomed. No acute distress  SKIN: No diaphoresis rash; pt heels are dressed, she is  in soft boots HEENT: Unremarkable RESPIRATORY: Breathing is even, unlabored. Lung sounds are clear   CARDIOVASCULAR: Heart RRR no murmurs, rubs or gallops. No peripheral edema  GASTROINTESTINAL: Abdomen is soft, non-tender, not distended w/ normal bowel sounds.  GENITOURINARY: Bladder non tender, not distended  MUSCULOSKELETAL: R leg in splint NEUROLOGIC: Cranial nerves 2-12 grossly intact. Moves all extremities no tremor. PSYCHIATRIC:dementia, no behavioral issues  Patient Active Problem List   Diagnosis Date Noted  . Closed fracture of proximal end of right tibia with routine healing 06/18/2013  . Decubitus ulcer of foot, unstageable 05/31/2013  . Insomnia 05/31/2013  . Hyperkalemia 05/10/2013  . Dysphagia, unspecified(787.20) 05/10/2013  . Weakness generalized 05/06/2013  . Palliative care encounter 05/04/2013  . Atrial fibrillation 05/04/2013  . Altered mental state 05/03/2013  . Acute renal failure 05/03/2013  . Hypothermia 05/03/2013  . Encephalopathy 05/03/2013  . Closed supracondylar fracture of left elbow with routine healing 04/09/2013  . UTI (urinary tract infection) 04/09/2013  . Dementia with behavioral disturbance 04/09/2013  . Failure to thrive 03/31/2013  . DNR (do not resuscitate) 03/31/2013  . Encounter for long-term (current) use of other medications 01/11/2013  . Acute venous embolism and thrombosis of deep vessels of distal lower extremity 12/30/2012  . Unspecified hypothyroidism 12/23/2012  . Chronic systolic congestive heart failure 12/23/2012  . Acute posthemorrhagic anemia 12/09/2012  . Unspecified constipation 11/23/2012  . Intertrochanteric fracture of left hip 11/16/2012  . Coronary artery disease   . Hypertension   . Anemia     CBC    Component Value Date/Time   WBC 5.6 05/05/2013 0535   RBC 3.16* 05/05/2013 0535   HGB 10.0* 05/05/2013 0535   HCT 30.4* 05/05/2013 0535   PLT 293 05/05/2013 0535   MCV 96.2 05/05/2013 0535   LYMPHSABS 1.9  05/03/2013 0229   MONOABS 0.8 05/03/2013 0229   EOSABS 0.0 05/03/2013 0229   BASOSABS 0.0 05/03/2013 0229    CMP     Component Value Date/Time   NA 142 05/06/2013 0525   K 4.1 05/06/2013 0525   CL 105 05/06/2013 0525   CO2 25 05/06/2013 0525   GLUCOSE 113* 05/06/2013 0525   BUN 38* 05/06/2013 0525   CREATININE 1.31* 05/06/2013 0525   CALCIUM 7.9* 05/06/2013 0525  PROT 7.9 05/03/2013 0229   ALBUMIN 3.6 05/03/2013 0229   AST 84* 05/03/2013 0229   ALT 59* 05/03/2013 0229   ALKPHOS 187* 05/03/2013 0229   BILITOT 1.3* 05/03/2013 0229   GFRNONAA 34* 05/06/2013 0525   GFRAA 39* 05/06/2013 0525    Assessment and Plan  Decubitus ulcer of foot, unstageable Pt has unstageable ulcers over both heels,. Wound care nurse uses skin prep on them. Feet are in soft boots. Will continue to follow.  Atrial fibrillation Rate control with toprol and amiodarone;prophylaxed with eliquis  Hypertension BP is still elevated but improved; will increase hydralazine to 25 mg TID  Dementia with behavioral disturbance Pt is calm today;continue lexapro and depakote  Unspecified hypothyroidism Continue no meds;recheck at 6 months  Closed fracture of proximal end of right tibia with routine healing Healing with immobilizer in place  Dysphagia, unspecified(787.20) Pt is now pocketing food;pallative care in place    Margit Hanks, MD

## 2013-06-23 NOTE — Assessment & Plan Note (Addendum)
BP is still elevated but improved; if pt wasn't pallative  would increase hydralazine to 25 mg TID for better control but her control now is acceptable for the short term

## 2013-06-23 NOTE — Assessment & Plan Note (Signed)
Pt is calm today;continue lexapro and depakote

## 2013-06-23 NOTE — Assessment & Plan Note (Signed)
Continue no meds;recheck at 6 months

## 2013-06-23 NOTE — Assessment & Plan Note (Signed)
Pt has unstageable ulcers over both heels,. Wound care nurse uses skin prep on them. Feet are in soft boots. Will continue to follow.

## 2013-06-23 NOTE — Assessment & Plan Note (Signed)
Pt is now pocketing food;pallative care in place

## 2013-06-23 NOTE — Assessment & Plan Note (Signed)
Rate control with toprol and amiodarone;prophylaxed with eliquis

## 2013-06-30 ENCOUNTER — Non-Acute Institutional Stay (SKILLED_NURSING_FACILITY): Payer: Medicare Other | Admitting: Internal Medicine

## 2013-06-30 ENCOUNTER — Encounter: Payer: Self-pay | Admitting: Internal Medicine

## 2013-06-30 DIAGNOSIS — L89899 Pressure ulcer of other site, unspecified stage: Secondary | ICD-10-CM

## 2013-06-30 DIAGNOSIS — S42309D Unspecified fracture of shaft of humerus, unspecified arm, subsequent encounter for fracture with routine healing: Secondary | ICD-10-CM

## 2013-06-30 DIAGNOSIS — S82101D Unspecified fracture of upper end of right tibia, subsequent encounter for closed fracture with routine healing: Secondary | ICD-10-CM

## 2013-06-30 DIAGNOSIS — S82209A Unspecified fracture of shaft of unspecified tibia, initial encounter for closed fracture: Secondary | ICD-10-CM | POA: Insufficient documentation

## 2013-06-30 DIAGNOSIS — S8290XD Unspecified fracture of unspecified lower leg, subsequent encounter for closed fracture with routine healing: Secondary | ICD-10-CM

## 2013-06-30 DIAGNOSIS — L8992 Pressure ulcer of unspecified site, stage 2: Secondary | ICD-10-CM

## 2013-06-30 DIAGNOSIS — S42412D Displaced simple supracondylar fracture without intercondylar fracture of left humerus, subsequent encounter for fracture with routine healing: Secondary | ICD-10-CM

## 2013-06-30 DIAGNOSIS — L89892 Pressure ulcer of other site, stage 2: Secondary | ICD-10-CM

## 2013-06-30 NOTE — Assessment & Plan Note (Signed)
Pt now has a stage 2 wound in calf under the immobilizer. The wound care nurse will begin seeing pt

## 2013-06-30 NOTE — Assessment & Plan Note (Signed)
Chart was reviewed. Pt's fx was dx during hospital stay 12/24-29/2014 for pain, not stated when she may have sustained it;seen by ortho with rec conservative management; it appears pt has not seen ortho in follow up as she should and she is now in at least 12+ weeks s/p fx; will d/c splint today. Pt has no pain with movement. She would never have been a surgical candidate. It is doubtful that she will be hinderd much by the decrease in ROM.

## 2013-06-30 NOTE — Progress Notes (Signed)
MRN: [id number] Name: Danielle [name]  Sex: female Age: 80 y.o. DOB: Jan 21, 1917  PSC #: [facility] Facility/Room: 312 Level Of Care: SNF Provider: Merrilee Seashore D Emergency Contacts: Extended Emergency Contact Information Primary Emergency Contact: [name],[name] Address: [address]          [place] [address] [place] Home Phone: [phone] Relation: Daughter  Code Status: DNR, palllative  Allergies: Keflex and Penicillins  Chief Complaint  Patient presents with  . Medical Managment of Chronic Issues    HPI: Patient is 80 y.o. female who the nurse asked me to see regarding her elbow and whether she can have it out of the sling and use it.  Past Medical History  Diagnosis Date  . Coronary artery disease   . CHF (congestive heart failure)   . Hypertension   . Anemia     acute blood loss from hip surgery 11/2012  . A-fib   . Elbow fracture 03/31/2013    left supracondylar  . Closed tibial fracture     Past Surgical History  Procedure Laterality Date  . Femur im nail Left 11/09/2012    Procedure: INTRAMEDULLARY (IM) NAIL FEMORAL/Left;  Surgeon: Toni Arthurs, MD;  Location: [facility] OR;  Service: Orthopedics;  Laterality: Left;      Medication List       This list is accurate as of: 06/30/13  7:26 PM.  Always use your most recent med list.               acetaminophen 325 MG tablet  Commonly known as:  TYLENOL  Take 650 mg by mouth every 6 (six) hours as needed for mild pain or moderate pain.     amiodarone 200 MG tablet  Commonly known as:  PACERONE  Take 1 tablet (200 mg total) by mouth daily.     apixaban 2.5 MG Tabs tablet  Commonly known as:  ELIQUIS  Take 1 tablet (2.5 mg total) by mouth 2 (two) times daily.     collagenase ointment  Commonly known as:  SANTYL  Apply 1 application topically daily.     divalproex 125 MG capsule  Commonly known as:  DEPAKOTE SPRINKLE  Take 250 mg by mouth 2 (two) times daily.     escitalopram  10 MG tablet  Commonly known as:  LEXAPRO  Take 10 mg by mouth daily.     feeding supplement (ENSURE COMPLETE) Liqd  Take 237 mLs by mouth 2 (two) times daily between meals.     furosemide 20 MG tablet  Commonly known as:  LASIX  Take 20 mg by mouth 2 (two) times daily.     hydrALAZINE 10 MG tablet  Commonly known as:  APRESOLINE  Take 10 mg by mouth 3 (three) times daily.     Iron 325 (65 FE) MG Tabs  Take 1 tablet by mouth 2 (two) times daily.     ketorolac 0.5 % ophthalmic solution  Commonly known as:  ACULAR  Place 1 drop into the right eye 4 (four) times daily.     MELATONIN PO  Take 6 mg by mouth at bedtime as needed.     metoprolol succinate 25 MG 24 hr tablet  Commonly known as:  TOPROL-XL  Take 25 mg by mouth 2 (two) times daily.     morphine 20 MG/ML concentrated solution  Commonly known as:  ROXANOL  Give 0.40ml by mouth every 6 hours as needed for pain     multivitamin with minerals  tablet  Take 1 tablet by mouth daily.     nitroGLYCERIN 0.4 MG SL tablet  Commonly known as:  NITROSTAT  Place 0.4 mg under the tongue every 5 (five) minutes as needed for chest pain.     potassium chloride SA 20 MEQ tablet  Commonly known as:  K-DUR,KLOR-CON  Take 1 tablet (20 mEq total) by mouth daily. Crushed and give with apple sauce.     traMADol 50 MG tablet  Commonly known as:  ULTRAM  Take 50 mg by mouth every 12 (twelve) hours.     vitamin C 500 MG tablet  Commonly known as:  ASCORBIC ACID  Take 500 mg by mouth daily.        No orders of the defined types were placed in this encounter.     There is no immunization history on file for this patient.  History  Substance Use Topics  . Smoking status: Never Smoker   . Smokeless tobacco: Never Used  . Alcohol Use: No    Review of Systems  DATA OBTAINED: UTO- pt has dementia   Filed Vitals:   06/30/13 1129  BP: 134/62  Pulse: 70  Temp: 96.7 F (35.9 C)  Resp: 20    Physical Exam  GENERAL  APPEARANCE: Alert, min conversant. Appropriately groomed. No acute distress  SKIN: No diaphoresis rash; stage 2 wound on R leg under immobilizer HEENT: Unremarkable RESPIRATORY: Breathing is even, unlabored. Lung sounds are clear   CARDIOVASCULAR: Heart RRR no murmurs, rubs or gallops GASTROINTESTINAL: Abdomen is soft, non-tender, not distended w/ normal bowel sounds.  GENITOURINARY: Bladder non tender, not distended  MUSCULOSKELETAL: bony contours l elbow appear enlarged but no bruising, fluid, crepitance or TTP; leg in immobilizer NEUROLOGIC: Cranial nerves 2-12 grossly intact.  PSYCHIATRIC: Mood and affect appropriate to situation, no behavioral issues  Patient Active Problem List   Diagnosis Date Noted  . Closed fracture of proximal end of right tibia with routine healing 06/18/2013  . Decubitus ulcer of foot, unstageable 05/31/2013  . Insomnia 05/31/2013  . Hyperkalemia 05/10/2013  . Dysphagia, unspecified(787.20) 05/10/2013  . Weakness generalized 05/06/2013  . Palliative care encounter 05/04/2013  . Atrial fibrillation 05/04/2013  . Altered mental state 05/03/2013  . Acute renal failure 05/03/2013  . Hypothermia 05/03/2013  . Encephalopathy 05/03/2013  . Closed supracondylar fracture of left elbow with routine healing 04/09/2013  . UTI (urinary tract infection) 04/09/2013  . Dementia with behavioral disturbance 04/09/2013  . Failure to thrive 03/31/2013  . DNR (do not resuscitate) 03/31/2013  . Encounter for long-term (current) use of other medications 01/11/2013  . Acute venous embolism and thrombosis of deep vessels of distal lower extremity 12/30/2012  . Unspecified hypothyroidism 12/23/2012  . Chronic systolic congestive heart failure 12/23/2012  . Acute posthemorrhagic anemia 12/09/2012  . Unspecified constipation 11/23/2012  . Intertrochanteric fracture of left hip 11/16/2012  . Coronary artery disease   . Hypertension   . Anemia     CBC    Component Value  Date/Time   WBC 5.6 05/05/2013 0535   RBC 3.16* 05/05/2013 0535   HGB 10.0* 05/05/2013 0535   HCT 30.4* 05/05/2013 0535   PLT 293 05/05/2013 0535   MCV 96.2 05/05/2013 0535   LYMPHSABS 1.9 05/03/2013 0229   MONOABS 0.8 05/03/2013 0229   EOSABS 0.0 05/03/2013 0229   BASOSABS 0.0 05/03/2013 0229    CMP     Component Value Date/Time   NA 142 05/06/2013 0525   K 4.1 05/06/2013 0525  CL 105 05/06/2013 0525   CO2 25 05/06/2013 0525   GLUCOSE 113* 05/06/2013 0525   BUN 38* 05/06/2013 0525   CREATININE 1.31* 05/06/2013 0525   CALCIUM 7.9* 05/06/2013 0525   PROT 7.9 05/03/2013 0229   ALBUMIN 3.6 05/03/2013 0229   AST 84* 05/03/2013 0229   ALT 59* 05/03/2013 0229   ALKPHOS 187* 05/03/2013 0229   BILITOT 1.3* 05/03/2013 0229   GFRNONAA 34* 05/06/2013 0525   GFRAA 39* 05/06/2013 0525    Assessment and Plan  Closed supracondylar fracture of left elbow with routine healing Chart was reviewed. Pt's fx was dx during hospital stay 12/24-29/2014 for pain, not stated when she may have sustained it;seen by ortho with rec conservative management; it appears pt has not seen ortho in follow up as she should and she is now in at least 12+ weeks s/p fx; will d/c splint today. Pt has no pain with movement. She would never have been a surgical candidate. It is doubtful that she will be hinderd much by the decrease in ROM.  Closed fracture of proximal end of right tibia with routine healing Pt now has a stage 2 wound in calf under the immobilizer. The wound care nurse will begin seeing pt  STAGE 2 DECUBITUS- R leg - wound care to see regularly  Margit HanksALEXANDER, Ioan Landini D, MD

## 2013-07-23 ENCOUNTER — Other Ambulatory Visit: Payer: Self-pay | Admitting: *Deleted

## 2013-07-23 MED ORDER — TRAMADOL HCL 50 MG PO TABS: 50.0000 mg | ORAL_TABLET | Freq: Two times a day (BID) | ORAL | Status: DC

## 2013-07-23 NOTE — Telephone Encounter (Signed)
[facility] 

## 2013-07-27 ENCOUNTER — Other Ambulatory Visit: Payer: Self-pay | Admitting: *Deleted

## 2013-07-27 MED ORDER — TRAMADOL HCL 50 MG PO TABS: ORAL_TABLET | ORAL | Status: AC

## 2013-07-27 NOTE — Telephone Encounter (Signed)
[facility] 

## 2013-08-03 ENCOUNTER — Non-Acute Institutional Stay (SKILLED_NURSING_FACILITY): Payer: Medicare Other | Admitting: Nurse Practitioner

## 2013-08-03 ENCOUNTER — Encounter: Payer: Self-pay | Admitting: Nurse Practitioner

## 2013-08-03 DIAGNOSIS — F0391 Unspecified dementia with behavioral disturbance: Secondary | ICD-10-CM

## 2013-08-03 DIAGNOSIS — F03918 Unspecified dementia, unspecified severity, with other behavioral disturbance: Secondary | ICD-10-CM

## 2013-08-03 DIAGNOSIS — I2510 Atherosclerotic heart disease of native coronary artery without angina pectoris: Secondary | ICD-10-CM

## 2013-08-03 DIAGNOSIS — I4891 Unspecified atrial fibrillation: Secondary | ICD-10-CM

## 2013-08-03 DIAGNOSIS — I10 Essential (primary) hypertension: Secondary | ICD-10-CM

## 2013-08-03 DIAGNOSIS — IMO0002 Reserved for concepts with insufficient information to code with codable children: Secondary | ICD-10-CM

## 2013-08-03 NOTE — Progress Notes (Signed)
Patient ID: Danielle [name], female   DOB: 12-Sep-1916, 80 y.o.   MRN: [id number]    Nursing Home Location:  [facility] of Service: SNF (31)  PCP: Patricia Nettle, MD  Allergies  Allergen Reactions  . Keflex [Cephalexin] Itching  . Penicillins Itching    Chief Complaint  Patient presents with  . Medical Management of Chronic Issues    HPI:  80 y.o. female with PMH history of congestive heart failure, hypertension and anemia, dementia, FTT, who is being seen today for routine follow up on chronic conditions. Pt is a poor historian and does not offer any information for HPI or ROS, staff does not report any concerns at this time.   Review of Systems:  Review of Systems  Unable to perform ROS: dementia  Eyes: Positive for double vision.     Past Medical History  Diagnosis Date  . Coronary artery disease   . CHF (congestive heart failure)   . Hypertension   . Anemia     acute blood loss from hip surgery 11/2012  . A-fib   . Elbow fracture 03/31/2013    left supracondylar  . Closed tibial fracture    Past Surgical History  Procedure Laterality Date  . Femur im nail Left 11/09/2012    Procedure: INTRAMEDULLARY (IM) NAIL FEMORAL/Left;  Surgeon: Wylene Simmer, MD;  Location: [facility];  Service: Orthopedics;  Laterality: Left;   Social History:   reports that she has never smoked. She has never used smokeless tobacco. She reports that she does not drink alcohol. Her drug history is not on file.  No family history on file.  Medications: Patient's Medications  New Prescriptions   No medications on file  Previous Medications   ACETAMINOPHEN (TYLENOL) 325 MG TABLET    Take 650 mg by mouth every 6 (six) hours as needed for mild pain or moderate pain.   AMIODARONE (PACERONE) 200 MG TABLET    Take 1 tablet (200 mg total) by mouth daily.   APIXABAN (ELIQUIS) 2.5 MG TABS TABLET    Take 1 tablet (2.5 mg total) by mouth 2 (two) times daily.   COLLAGENASE (SANTYL)  OINTMENT    Apply 1 application topically daily.   DIVALPROEX (DEPAKOTE SPRINKLE) 125 MG CAPSULE    Take 250 mg by mouth 2 (two) times daily.   ESCITALOPRAM (LEXAPRO) 10 MG TABLET    Take 10 mg by mouth daily.   FEEDING SUPPLEMENT, ENSURE COMPLETE, (ENSURE COMPLETE) LIQD    Take 237 mLs by mouth 2 (two) times daily between meals.   FERROUS SULFATE (IRON) 325 (65 FE) MG TABS    Take 1 tablet by mouth 2 (two) times daily.   FUROSEMIDE (LASIX) 20 MG TABLET    Take 20 mg by mouth 2 (two) times daily.    HYDRALAZINE (APRESOLINE) 10 MG TABLET    Take 10 mg by mouth 3 (three) times daily.   KETOROLAC (ACULAR) 0.5 % OPHTHALMIC SOLUTION    Place 1 drop into the right eye 4 (four) times daily.   MELATONIN PO    Take 6 mg by mouth at bedtime as needed.   METOPROLOL SUCCINATE (TOPROL-XL) 25 MG 24 HR TABLET    Take 25 mg by mouth 2 (two) times daily.   MORPHINE (ROXANOL) 20 MG/ML CONCENTRATED SOLUTION    Give 0.9ml by mouth every 6 hours as needed for pain   MULTIPLE VITAMINS-MINERALS (MULTIVITAMIN WITH MINERALS) TABLET    Take 1 tablet  by mouth daily.   NITROGLYCERIN (NITROSTAT) 0.4 MG SL TABLET    Place 0.4 mg under the tongue every 5 (five) minutes as needed for chest pain.   POTASSIUM CHLORIDE SA (K-DUR,KLOR-CON) 20 MEQ TABLET    Take 1 tablet (20 mEq total) by mouth daily. Crushed and give with apple sauce.   TRAMADOL (ULTRAM) 50 MG TABLET    Take one tablet by mouth every 12 hours for pain   VITAMIN C (ASCORBIC ACID) 500 MG TABLET    Take 500 mg by mouth daily.  Modified Medications   No medications on file  Discontinued Medications   No medications on file     Physical Exam:  Filed Vitals:   08/03/13 1327  BP: 156/86  Pulse: 93  Temp: 96.6 F (35.9 C)  Resp: 20    Physical Exam  Constitutional: She is well-developed, well-nourished, and in no distress.  HENT:  Head: Normocephalic and atraumatic.  Nose: Nose normal.  Mouth/Throat: Oropharynx is clear and moist. No oropharyngeal  exudate.  Eyes: Conjunctivae and EOM are normal. Pupils are equal, round, and reactive to light.  Neck: Normal range of motion. Neck supple. No JVD present. No thyromegaly present.  Cardiovascular: Normal rate, regular rhythm and normal heart sounds.   Pulmonary/Chest: Effort normal and breath sounds normal. No respiratory distress.  Abdominal: Soft. Bowel sounds are normal. She exhibits no distension. There is no tenderness.  Musculoskeletal: She exhibits no edema.  Neurological: She is alert.  Skin: Skin is warm and dry.  Psychiatric: She exhibits abnormal recent memory.     Labs reviewed: Basic Metabolic Panel:  Recent Labs  05/04/13 0315 05/05/13 0535 05/06/13 0525  NA 148* 148* 142  K 4.6 3.9 4.1  CL 110 111 105  CO2 $Re'24 24 25  'CmD$ GLUCOSE 67* 138* 113*  BUN 56* 48* 38*  CREATININE 1.72* 1.53* 1.31*  CALCIUM 8.4 8.0* 7.9*   Liver Function Tests:  Recent Labs  11/08/12 1851 03/31/13 1648 05/03/13 0229  AST 58* 52* 84*  ALT 16 23 59*  ALKPHOS 85 90 187*  BILITOT 0.7 0.9 1.3*  PROT 7.7 6.3 7.9  ALBUMIN 3.5 2.9* 3.6    Recent Labs  03/31/13 1648  LIPASE 18    Recent Labs  05/03/13 1228  AMMONIA 49   CBC:  Recent Labs  11/08/12 1851  03/31/13 1648  05/03/13 0229 05/03/13 0238 05/04/13 0315 05/05/13 0535  WBC 12.4*  < > 9.3  < > 6.2  --  4.7 5.6  NEUTROABS 9.9*  --  7.0  --  3.5  --   --   --   HGB 11.0*  < > 11.3*  < > 11.1* 12.9 9.4* 10.0*  HCT 31.0*  < > 33.7*  < > 33.4* 38.0 28.7* 30.4*  MCV 88.6  < > 91.8  < > 95.4  --  96.0 96.2  PLT 195  < > 189  < > 370  --  292 293  < > = values in this interval not displayed.  CBC with Diff    Result: 07/19/2013 2:57 PM   ( Status: F )       WBC 7.9     4.0-10.5 K/uL SLN   RBC 3.99     3.87-5.11 MIL/uL SLN   Hemoglobin 11.7   L 12.0-15.0 g/dL SLN   Hematocrit 33.6   L 36.0-46.0 % SLN   MCV 84.2     78.0-100.0 fL SLN   MCH 29.3  26.0-34.0 pg SLN   MCHC 34.8     30.0-36.0 g/dL SLN   RDW 17.2    H 11.5-15.5 % SLN   Platelet Count 160     150-400 K/uL SLN   Granulocyte % 68     43-77 % SLN   Absolute Gran 5.4     1.7-7.7 K/uL SLN   Lymph % 18     12-46 % SLN   Absolute Lymph 1.4     0.7-4.0 K/uL SLN   Mono % 12     3-12 % SLN   Absolute Mono 0.9     0.1-1.0 K/uL SLN   Eos % 2     0-5 % SLN   Absolute Eos 0.2     0.0-0.7 K/uL SLN   Baso % 0     0-1 % SLN   Absolute Baso 0.0     0.0-0.1 K/uL SLN   Smear Review Criteria for review not met  SLN   Basic Metabolic Panel    Result: 07/19/2013 3:56 PM   ( Status: F )       Sodium 141     135-145 mEq/L SLN   Potassium 4.0     3.5-5.3 mEq/L SLN   Chloride 107     96-112 mEq/L SLN   CO2 28     19-32 mEq/L SLN   Glucose 72     70-99 mg/dL SLN   BUN 17     6-23 mg/dL SLN   Creatinine 0.71     0.50-1.10 mg/dL SLN   Calcium 8.3   L 8.4-10.5 mg/dL SLN Assessment/Plan 1. Hypertension With some elevations in blood pressure; however could be pain related  2. Coronary artery disease stable  3. Atrial fibrillation -rate controlled, with poor PO intake will dc amio today, conts on metoprolol and eliquis   4. Dementia with behavioral disturbance -advanced  5. Failure to thrive Cont with weight loss, however edema has resolved which could be contributing to loss, pt with poor PO intake, has multiple skin sores that are followed by wound care.  -will change weights to monthly as she is now being followed with hospice, also hospice as goals of care meeting with family tomorrow and at that time will evaluate need for medications and hopefully reduce pill burden by discontinuing medications at that time.

## 2013-08-17 ENCOUNTER — Other Ambulatory Visit: Payer: Self-pay | Admitting: *Deleted

## 2013-08-17 MED ORDER — FENTANYL 12 MCG/HR TD PT72: MEDICATED_PATCH | TRANSDERMAL | Status: AC

## 2013-08-17 NOTE — Telephone Encounter (Signed)
[facility]  

## 2013-08-20 ENCOUNTER — Other Ambulatory Visit: Payer: Self-pay | Admitting: *Deleted

## 2013-08-20 MED ORDER — LORAZEPAM 2 MG/ML PO CONC: ORAL | Status: AC

## 2013-08-20 MED ORDER — MORPHINE SULFATE (CONCENTRATE) 20 MG/ML PO SOLN: ORAL | Status: AC

## 2013-08-20 NOTE — Telephone Encounter (Signed)
[facility] 

## 2013-09-06 DEATH — deceased

## 2015-04-15 IMAGING — CR DG HIP COMPLETE 2+V*R*
2 series · 2 of 2 positions shown · non-contrast
Comparison: None.

CLINICAL DATA: Status post fall.  Right hip and upper leg pain.

RIGHT HIP - COMPLETE 2+ VIEW

[t hip ap right]
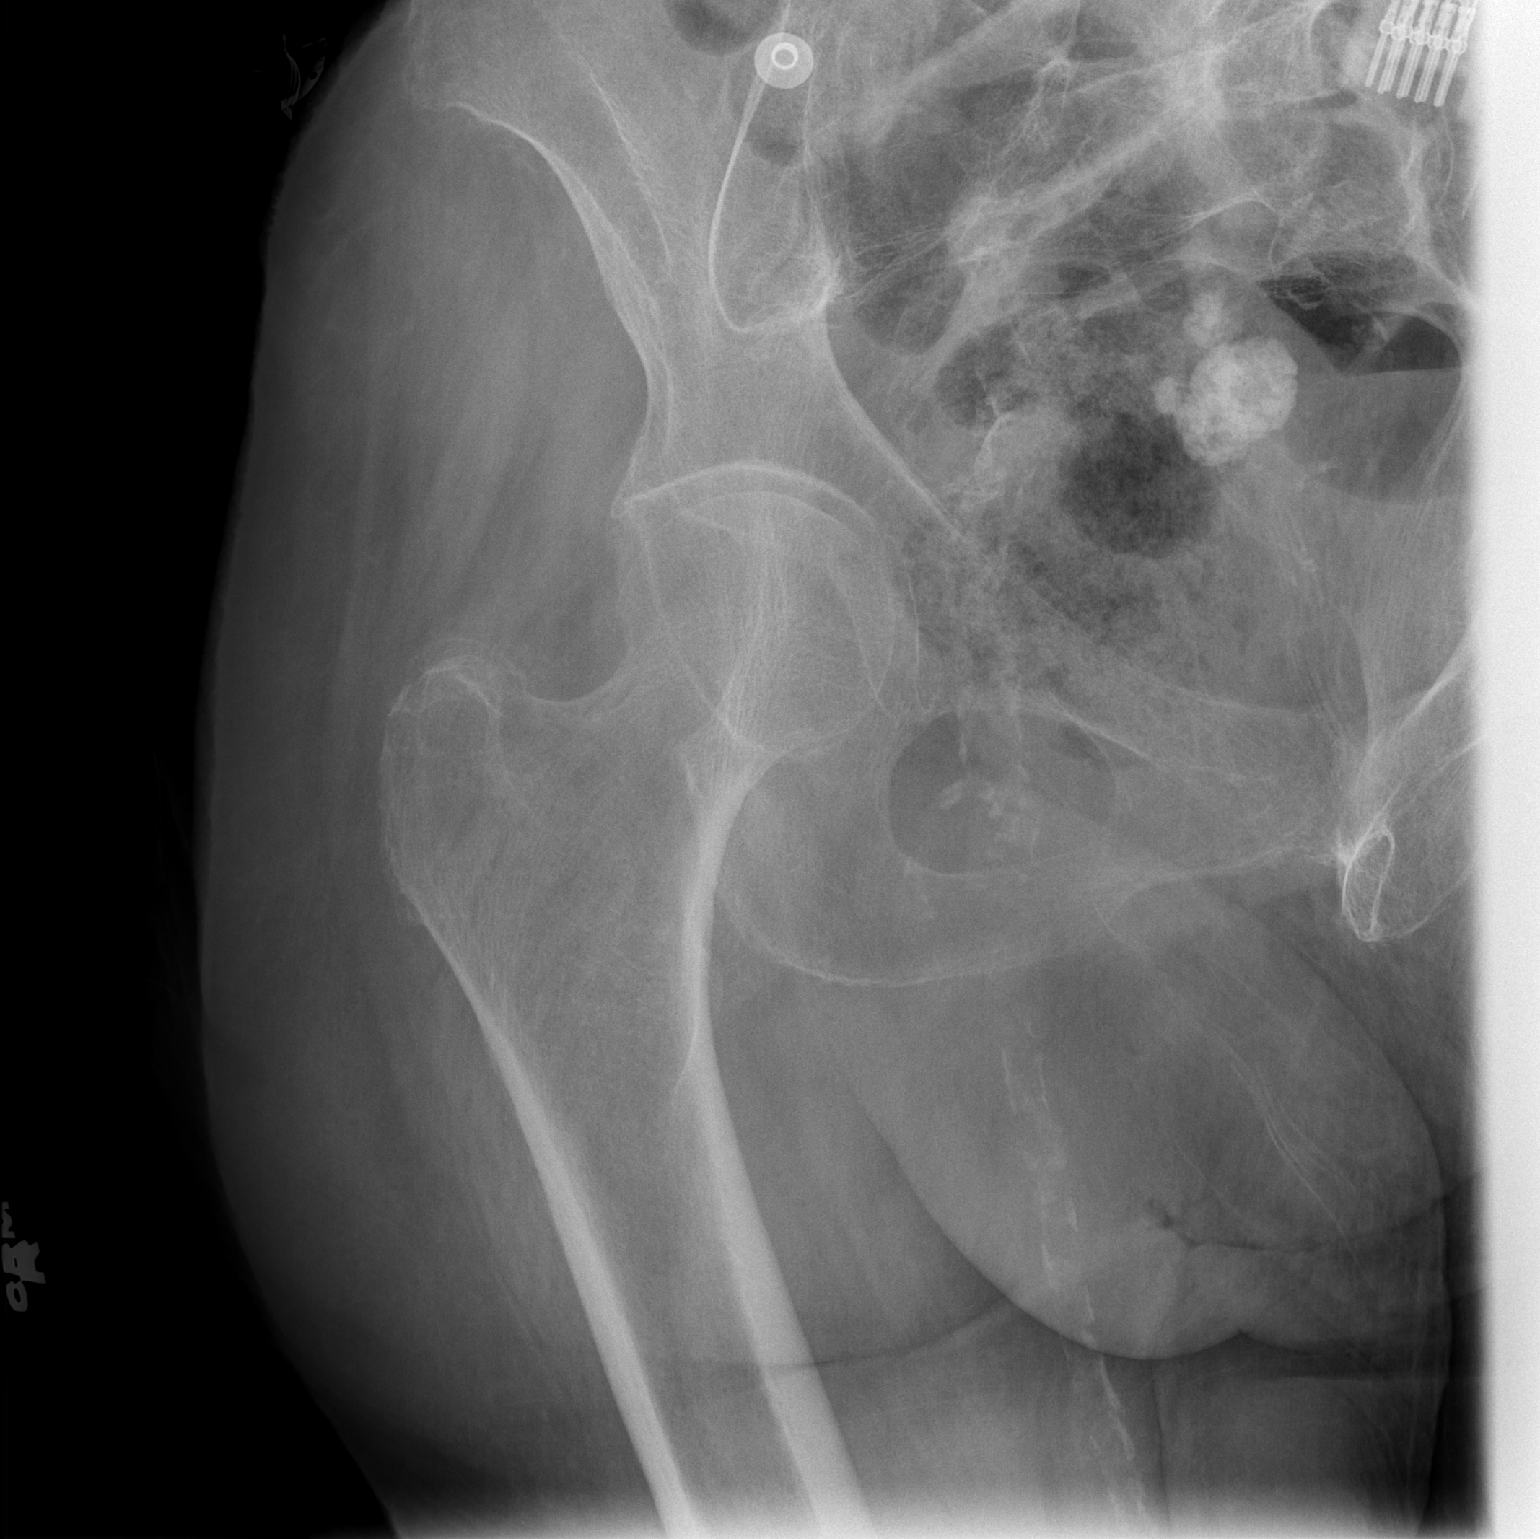

[t hip frog leg right]
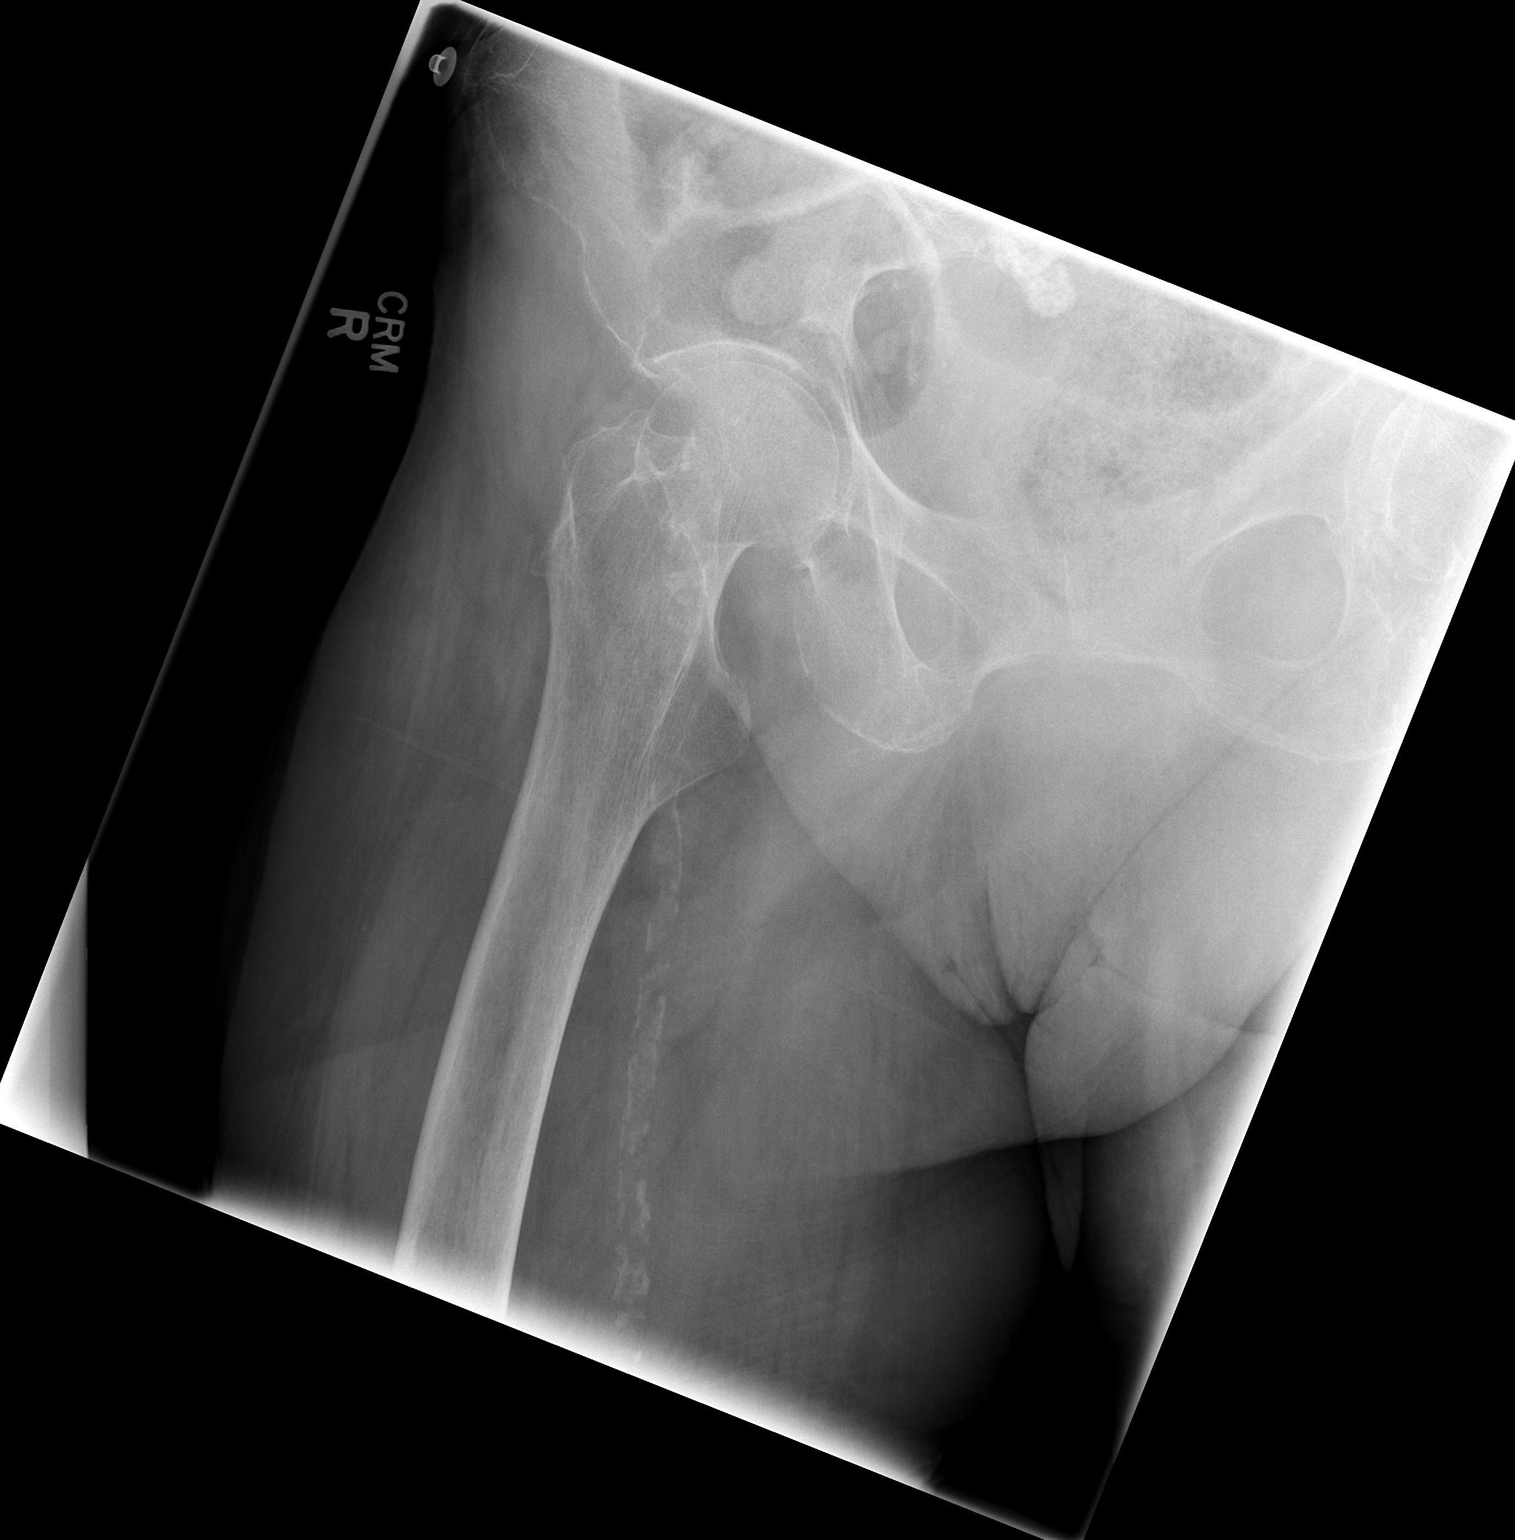

[2 of 2 positions shown; findings below may reference images not displayed]

FINDINGS: No acute bony or joint abnormality is identified.
Calcified uterine fibroid and atherosclerotic vascular disease are
noted.
IMPRESSION: No acute finding.

## 2015-04-15 IMAGING — CT CT CERVICAL SPINE W/O CM
4 of 9 series · 8 of 33 positions shown, 9 images · non-contrast
Comparison: 11/11/2008

CT HEAD

CLINICAL DATA: Recent traumatic injury with pain

CT HEAD WITHOUT CONTRAST
CT MAXILLOFACIAL WITHOUT CONTRAST
CT CERVICAL SPINE WITHOUT CONTRAST
TECHNIQUE: Multidetector CT imaging of the head, cervical spine,
and maxillofacial structures were performed using the standard
protocol without intravenous contrast. Multiplanar CT image
reconstructions of the cervical spine and maxillofacial structures
were also generated.

[Series 13: soft tissue · axial · 0.32mm/px · z∈[+480,+546]mm · 2 of 99 slices shown]
[im 33/99  soft-tissue]
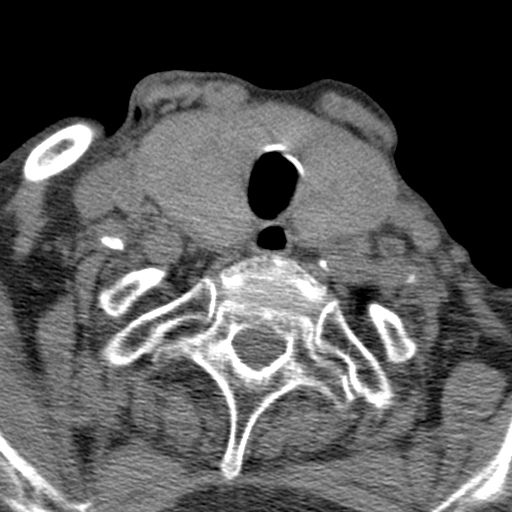
[im 66/99  soft-tissue]
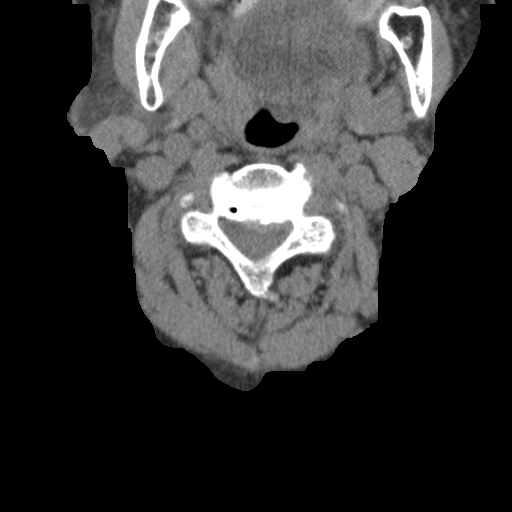

[mpr, coronal std, coronal · coronal · 0.37mm/px · 2 of 75 slices shown]
[im 25/75  bone]
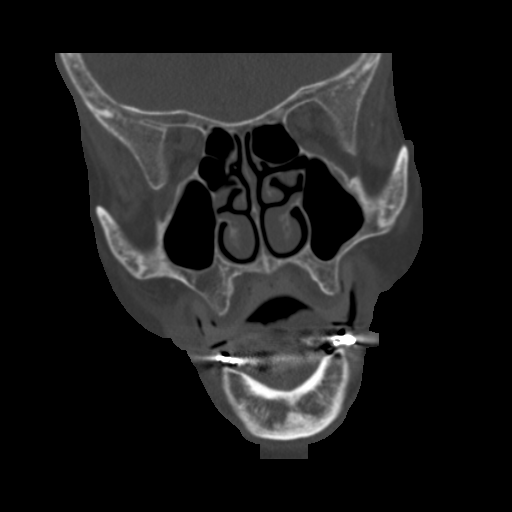
[im 50/75  bone]
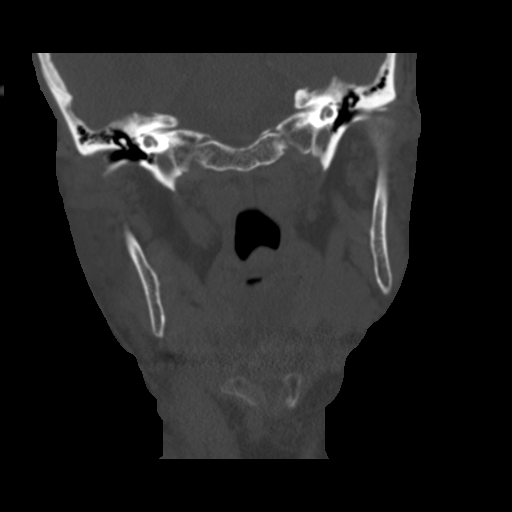

[orthog · axial · 0.32mm/px · z∈[+433,+494]mm · 2 of 102 slices shown, 3 images]
[im 34/102  soft-tissue]
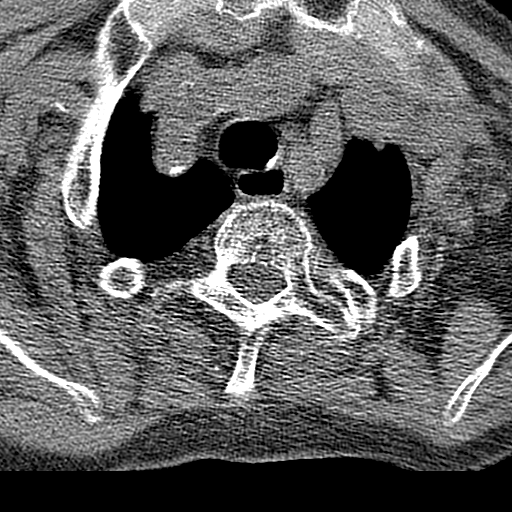
[im 34/102  bone]
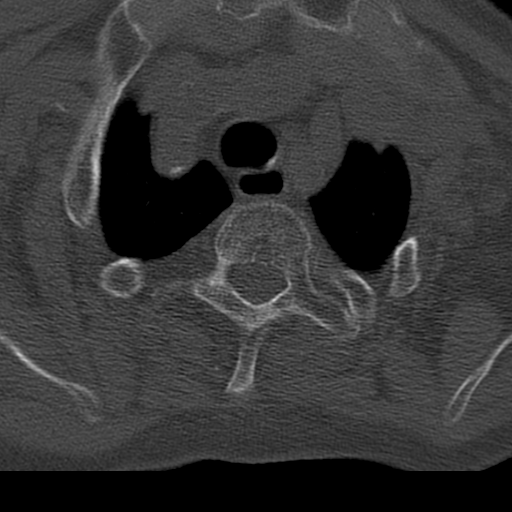
[im 68/102  bone]
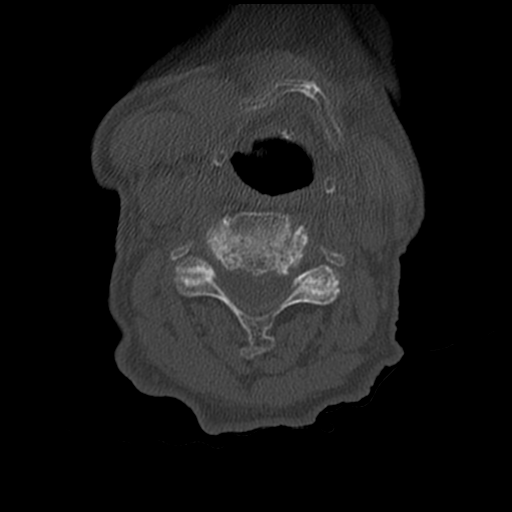

[sagittals · sagittal · 0.38mm/px · 2 of 36 slices shown]
[im 12/36  bone]
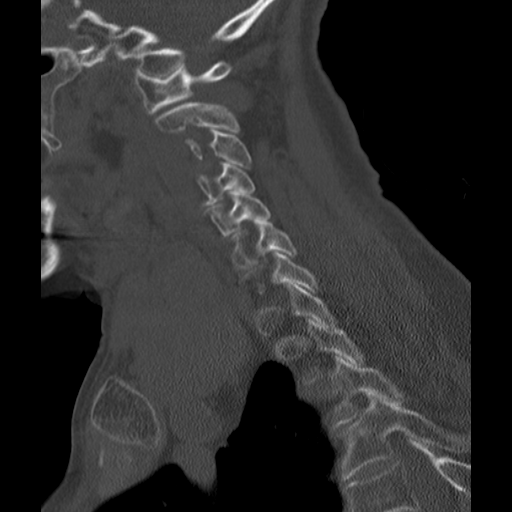
[im 24/36  bone]
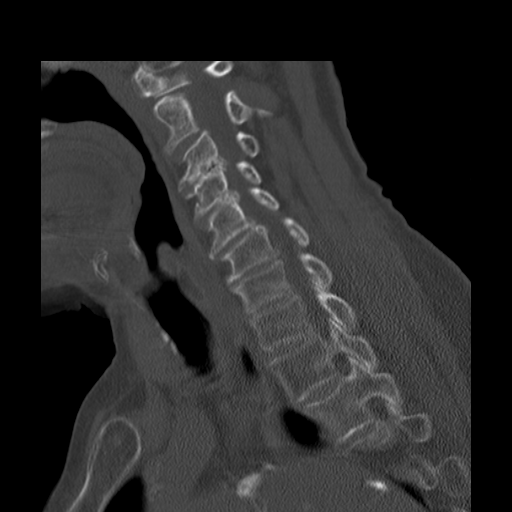

[8 of 33 positions shown; findings below may reference images not displayed]

FINDINGS: The bony calvarium is intact.  Atrophic changes and
chronic white matter ischemic change is seen and stable from prior
exam. The findings to suggest acute hemorrhage, acute infarction or
space-occupying mass lesion are noted.
IMPRESSION: Chronic changes without acute abnormality.

CT MAXILLOFACIAL
FINDINGS: The bony structures are within normal limits.  No acute
fracture is seen.  The surrounding soft tissues demonstrate the
thyroid gland to the prominent no other soft tissue abnormality is
seen.  No lymphadenopathy is noted.
IMPRESSION: No acute bony abnormality noted.

Findings suggestive of a thyroid goiter.

CT CERVICAL SPINE
FINDINGS: Seven cervical segments are well visualized.  Vertebral
body height is well-maintained.  Multilevel disc space narrowing
and facet hypertrophic changes are seen.  No acute fracture is
noted.  No acute facet abnormality is seen.  The surrounding soft
tissues demonstrate bilateral carotid calcifications.  There is
again enlargement of the thyroid gland most consistent with a
goiter.  The visualized lung apices are unremarkable.
IMPRESSION: Degenerative change without acute abnormality.

## 2015-09-07 IMAGING — CR DG ELBOW 2V*L*
2 series · 2 of 2 positions shown · non-contrast
Comparison: None

CLINICAL DATA: Pain status post fall

EXAM:
LEFT ELBOW - 2 VIEW

[x elbow lat left]
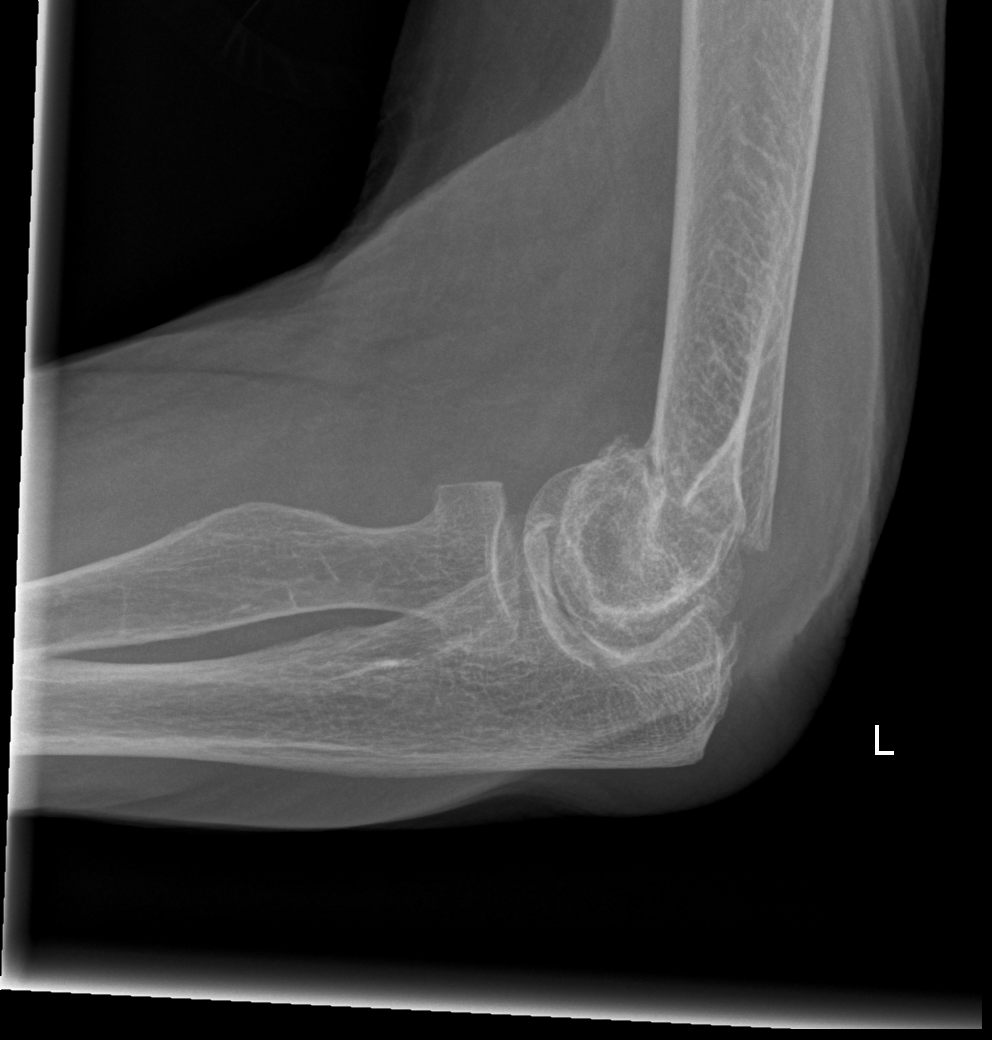

[x elbow ap left]
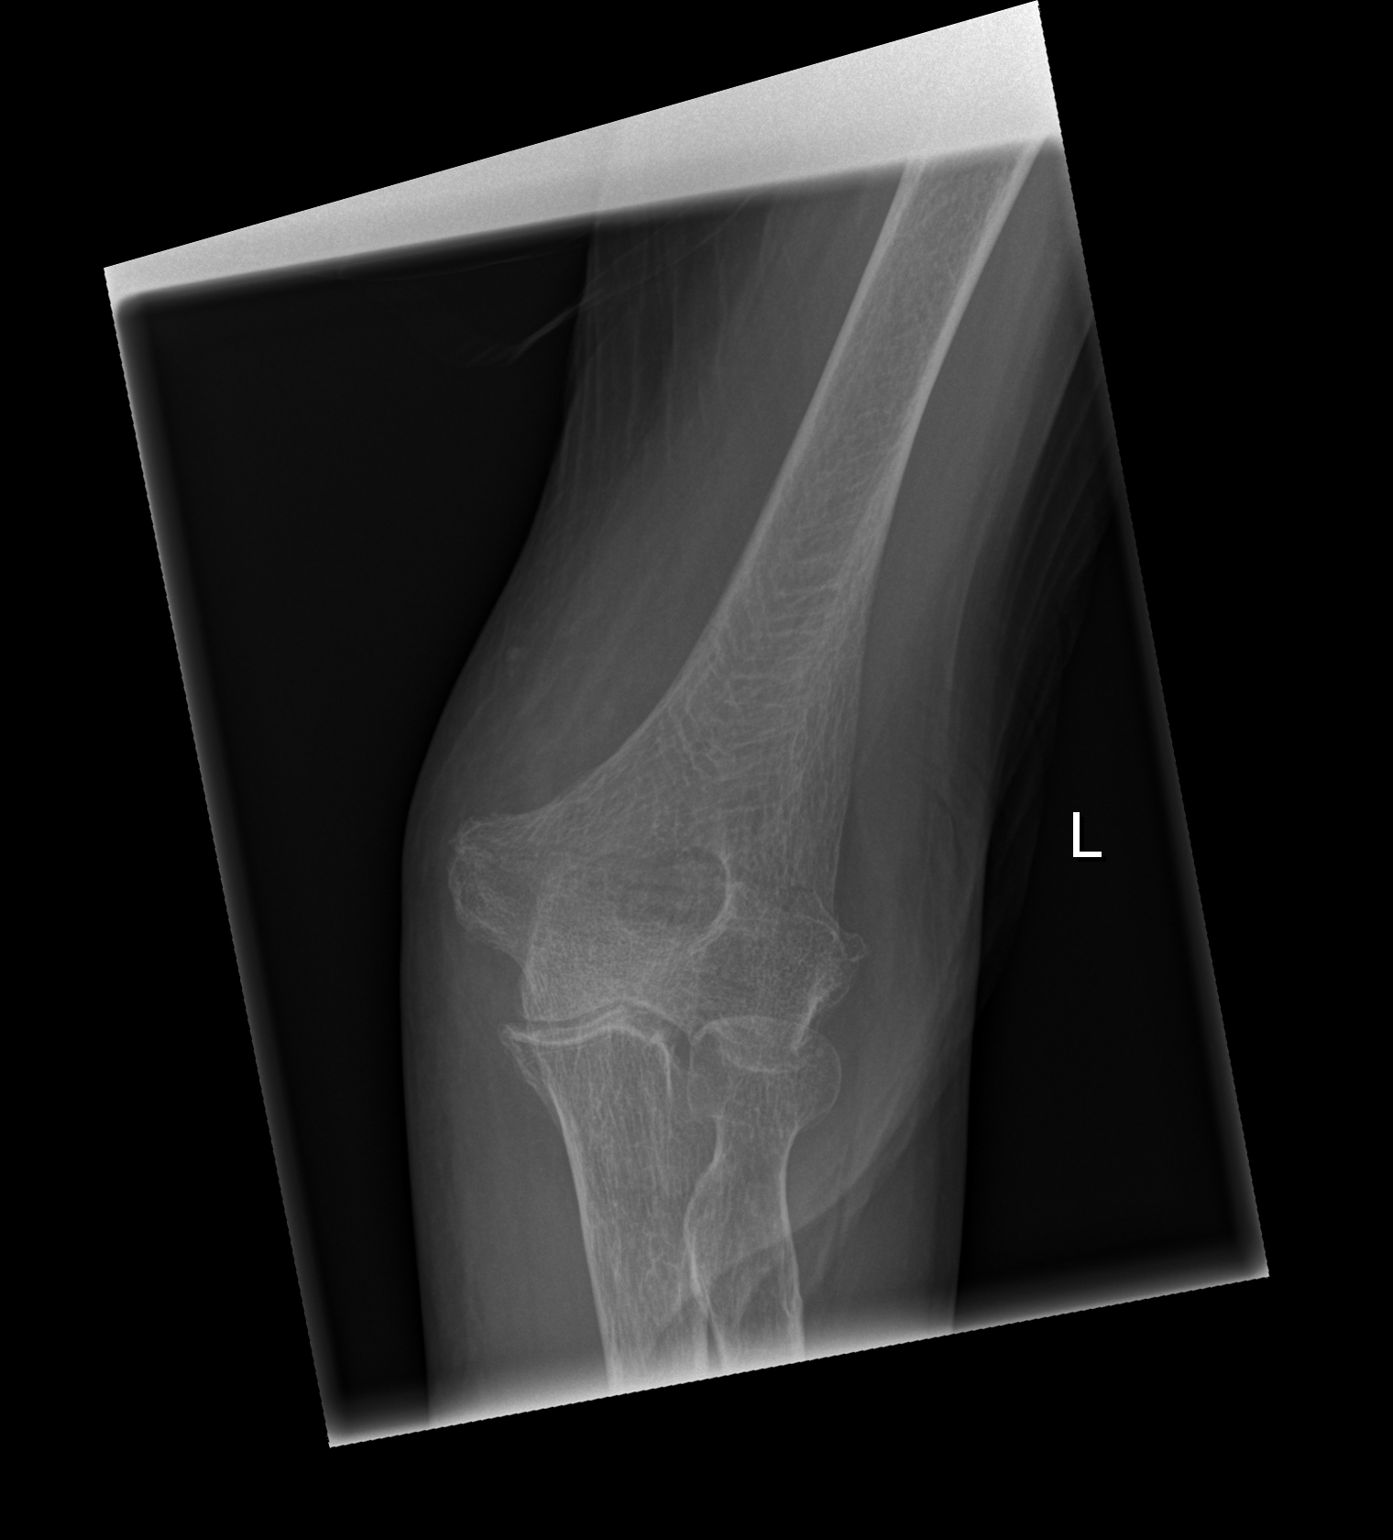

[2 of 2 positions shown; findings below may reference images not displayed]

FINDINGS: A transverse supracondylar fractures appreciated demonstrating mild
posterior displacement. Areas of osteophytosis identified along the
periphery of the epicondyles and olecranon. The bones are
osteopenic. A joint effusion is appreciated
IMPRESSION: Supracondylar fracture.
